# Patient Record
Sex: Female | Born: 1935
Health system: Southern US, Community
[De-identification: ages and names within clinical notes are randomized; demographics above are authoritative.]

## PROBLEM LIST (undated history)

## (undated) DIAGNOSIS — E119 Type 2 diabetes mellitus without complications: Secondary | ICD-10-CM

## (undated) DIAGNOSIS — J45909 Unspecified asthma, uncomplicated: Secondary | ICD-10-CM

## (undated) DIAGNOSIS — K219 Gastro-esophageal reflux disease without esophagitis: Secondary | ICD-10-CM

## (undated) DIAGNOSIS — I5030 Unspecified diastolic (congestive) heart failure: Secondary | ICD-10-CM

## (undated) DIAGNOSIS — I10 Essential (primary) hypertension: Secondary | ICD-10-CM

## (undated) DIAGNOSIS — E7800 Pure hypercholesterolemia, unspecified: Secondary | ICD-10-CM

## (undated) HISTORY — DX: Pure hypercholesterolemia, unspecified: E78.00

## (undated) HISTORY — DX: Unspecified diastolic (congestive) heart failure: I50.30

## (undated) HISTORY — DX: Gastro-esophageal reflux disease without esophagitis: K21.9

## (undated) HISTORY — DX: Unspecified asthma, uncomplicated: J45.909

## (undated) HISTORY — DX: Type 2 diabetes mellitus without complications: E11.9

## (undated) HISTORY — DX: Essential (primary) hypertension: I10

---

## 2011-05-07 ENCOUNTER — Ambulatory Visit: Payer: Self-pay | Admitting: Internal Medicine

## 2011-12-19 ENCOUNTER — Ambulatory Visit: Payer: Self-pay | Admitting: Internal Medicine

## 2012-11-30 ENCOUNTER — Emergency Department: Payer: Self-pay | Admitting: Emergency Medicine

## 2013-07-31 ENCOUNTER — Ambulatory Visit: Payer: Self-pay | Admitting: Internal Medicine

## 2013-08-06 ENCOUNTER — Ambulatory Visit: Payer: Self-pay | Admitting: Internal Medicine

## 2014-07-26 DIAGNOSIS — G5790 Unspecified mononeuropathy of unspecified lower limb: Secondary | ICD-10-CM | POA: Diagnosis not present

## 2014-07-26 DIAGNOSIS — E8881 Metabolic syndrome: Secondary | ICD-10-CM | POA: Diagnosis not present

## 2014-07-26 DIAGNOSIS — M7040 Prepatellar bursitis, unspecified knee: Secondary | ICD-10-CM | POA: Diagnosis not present

## 2014-07-26 DIAGNOSIS — E119 Type 2 diabetes mellitus without complications: Secondary | ICD-10-CM | POA: Diagnosis not present

## 2014-10-26 DIAGNOSIS — G5790 Unspecified mononeuropathy of unspecified lower limb: Secondary | ICD-10-CM | POA: Diagnosis not present

## 2014-10-26 DIAGNOSIS — M25561 Pain in right knee: Secondary | ICD-10-CM | POA: Diagnosis not present

## 2014-10-26 DIAGNOSIS — E119 Type 2 diabetes mellitus without complications: Secondary | ICD-10-CM | POA: Diagnosis not present

## 2014-10-27 ENCOUNTER — Other Ambulatory Visit: Payer: Self-pay | Admitting: Cardiology

## 2014-10-27 DIAGNOSIS — Z1231 Encounter for screening mammogram for malignant neoplasm of breast: Secondary | ICD-10-CM

## 2014-11-04 ENCOUNTER — Other Ambulatory Visit: Payer: Self-pay | Admitting: Cardiology

## 2014-11-04 ENCOUNTER — Ambulatory Visit
Admission: RE | Admit: 2014-11-04 | Discharge: 2014-11-04 | Disposition: A | Discharge status: Home / Self Care | Payer: Commercial Managed Care - HMO | Source: Ambulatory Visit | Attending: Cardiology | Admitting: Cardiology

## 2014-11-04 DIAGNOSIS — Z1231 Encounter for screening mammogram for malignant neoplasm of breast: Secondary | ICD-10-CM

## 2014-11-25 DIAGNOSIS — E119 Type 2 diabetes mellitus without complications: Secondary | ICD-10-CM | POA: Diagnosis not present

## 2014-11-25 DIAGNOSIS — G5790 Unspecified mononeuropathy of unspecified lower limb: Secondary | ICD-10-CM | POA: Diagnosis not present

## 2014-11-25 DIAGNOSIS — I10 Essential (primary) hypertension: Secondary | ICD-10-CM | POA: Diagnosis not present

## 2014-11-25 DIAGNOSIS — E784 Other hyperlipidemia: Secondary | ICD-10-CM | POA: Diagnosis not present

## 2014-12-02 DIAGNOSIS — E8881 Metabolic syndrome: Secondary | ICD-10-CM | POA: Diagnosis not present

## 2014-12-02 DIAGNOSIS — E119 Type 2 diabetes mellitus without complications: Secondary | ICD-10-CM | POA: Diagnosis not present

## 2014-12-02 DIAGNOSIS — G5790 Unspecified mononeuropathy of unspecified lower limb: Secondary | ICD-10-CM | POA: Diagnosis not present

## 2014-12-24 DIAGNOSIS — E119 Type 2 diabetes mellitus without complications: Secondary | ICD-10-CM | POA: Diagnosis not present

## 2015-03-04 DIAGNOSIS — I10 Essential (primary) hypertension: Secondary | ICD-10-CM | POA: Diagnosis not present

## 2015-03-04 DIAGNOSIS — Z23 Encounter for immunization: Secondary | ICD-10-CM | POA: Diagnosis not present

## 2015-03-04 DIAGNOSIS — G5790 Unspecified mononeuropathy of unspecified lower limb: Secondary | ICD-10-CM | POA: Diagnosis not present

## 2015-03-04 DIAGNOSIS — E119 Type 2 diabetes mellitus without complications: Secondary | ICD-10-CM | POA: Diagnosis not present

## 2015-04-01 DIAGNOSIS — E119 Type 2 diabetes mellitus without complications: Secondary | ICD-10-CM | POA: Diagnosis not present

## 2015-04-01 DIAGNOSIS — R1031 Right lower quadrant pain: Secondary | ICD-10-CM | POA: Diagnosis not present

## 2015-04-01 DIAGNOSIS — I10 Essential (primary) hypertension: Secondary | ICD-10-CM | POA: Diagnosis not present

## 2015-04-01 DIAGNOSIS — K5732 Diverticulitis of large intestine without perforation or abscess without bleeding: Secondary | ICD-10-CM | POA: Diagnosis not present

## 2015-04-07 ENCOUNTER — Ambulatory Visit
Admission: RE | Admit: 2015-04-07 | Discharge: 2015-04-07 | Disposition: A | Discharge status: Home / Self Care | Payer: Commercial Managed Care - HMO | Source: Ambulatory Visit | Attending: Internal Medicine | Admitting: Internal Medicine

## 2015-04-07 ENCOUNTER — Other Ambulatory Visit: Payer: Self-pay | Admitting: Internal Medicine

## 2015-04-07 ENCOUNTER — Ambulatory Visit
Admission: RE | Admit: 2015-04-07 | Discharge: 2015-04-07 | Disposition: A | Discharge status: Home / Self Care | Payer: Commercial Managed Care - HMO | Source: Ambulatory Visit | Attending: Cardiology | Admitting: Cardiology

## 2015-04-07 DIAGNOSIS — M5430 Sciatica, unspecified side: Secondary | ICD-10-CM

## 2015-04-07 DIAGNOSIS — M25551 Pain in right hip: Secondary | ICD-10-CM | POA: Diagnosis not present

## 2015-04-07 DIAGNOSIS — M5134 Other intervertebral disc degeneration, thoracic region: Secondary | ICD-10-CM | POA: Diagnosis not present

## 2015-04-07 DIAGNOSIS — E119 Type 2 diabetes mellitus without complications: Secondary | ICD-10-CM | POA: Diagnosis not present

## 2015-04-07 DIAGNOSIS — M5136 Other intervertebral disc degeneration, lumbar region: Secondary | ICD-10-CM | POA: Insufficient documentation

## 2015-04-07 DIAGNOSIS — M8580 Other specified disorders of bone density and structure, unspecified site: Secondary | ICD-10-CM | POA: Diagnosis not present

## 2015-04-12 DIAGNOSIS — K5732 Diverticulitis of large intestine without perforation or abscess without bleeding: Secondary | ICD-10-CM | POA: Diagnosis not present

## 2015-04-12 DIAGNOSIS — E8881 Metabolic syndrome: Secondary | ICD-10-CM | POA: Diagnosis not present

## 2015-04-12 DIAGNOSIS — E119 Type 2 diabetes mellitus without complications: Secondary | ICD-10-CM | POA: Diagnosis not present

## 2015-04-12 DIAGNOSIS — R1031 Right lower quadrant pain: Secondary | ICD-10-CM | POA: Diagnosis not present

## 2015-06-10 DIAGNOSIS — E119 Type 2 diabetes mellitus without complications: Secondary | ICD-10-CM | POA: Diagnosis not present

## 2015-06-10 DIAGNOSIS — E784 Other hyperlipidemia: Secondary | ICD-10-CM | POA: Diagnosis not present

## 2015-06-10 DIAGNOSIS — I10 Essential (primary) hypertension: Secondary | ICD-10-CM | POA: Diagnosis not present

## 2015-06-10 DIAGNOSIS — G5790 Unspecified mononeuropathy of unspecified lower limb: Secondary | ICD-10-CM | POA: Diagnosis not present

## 2015-06-10 DIAGNOSIS — R5381 Other malaise: Secondary | ICD-10-CM | POA: Diagnosis not present

## 2015-06-10 DIAGNOSIS — E8881 Metabolic syndrome: Secondary | ICD-10-CM | POA: Diagnosis not present

## 2015-06-17 DIAGNOSIS — Z0000 Encounter for general adult medical examination without abnormal findings: Secondary | ICD-10-CM | POA: Diagnosis not present

## 2015-06-17 DIAGNOSIS — G5790 Unspecified mononeuropathy of unspecified lower limb: Secondary | ICD-10-CM | POA: Diagnosis not present

## 2015-06-17 DIAGNOSIS — E8881 Metabolic syndrome: Secondary | ICD-10-CM | POA: Diagnosis not present

## 2015-06-17 DIAGNOSIS — E784 Other hyperlipidemia: Secondary | ICD-10-CM | POA: Diagnosis not present

## 2015-08-26 DIAGNOSIS — R300 Dysuria: Secondary | ICD-10-CM | POA: Diagnosis not present

## 2015-08-26 DIAGNOSIS — E119 Type 2 diabetes mellitus without complications: Secondary | ICD-10-CM | POA: Diagnosis not present

## 2015-09-16 DIAGNOSIS — I10 Essential (primary) hypertension: Secondary | ICD-10-CM | POA: Diagnosis not present

## 2015-09-16 DIAGNOSIS — E119 Type 2 diabetes mellitus without complications: Secondary | ICD-10-CM | POA: Diagnosis not present

## 2015-09-16 DIAGNOSIS — E784 Other hyperlipidemia: Secondary | ICD-10-CM | POA: Diagnosis not present

## 2015-09-16 DIAGNOSIS — G5790 Unspecified mononeuropathy of unspecified lower limb: Secondary | ICD-10-CM | POA: Diagnosis not present

## 2015-10-03 DIAGNOSIS — M7040 Prepatellar bursitis, unspecified knee: Secondary | ICD-10-CM | POA: Diagnosis not present

## 2015-10-03 DIAGNOSIS — G5790 Unspecified mononeuropathy of unspecified lower limb: Secondary | ICD-10-CM | POA: Diagnosis not present

## 2015-10-03 DIAGNOSIS — E784 Other hyperlipidemia: Secondary | ICD-10-CM | POA: Diagnosis not present

## 2015-11-09 DIAGNOSIS — I429 Cardiomyopathy, unspecified: Secondary | ICD-10-CM | POA: Diagnosis not present

## 2015-11-09 DIAGNOSIS — E784 Other hyperlipidemia: Secondary | ICD-10-CM | POA: Diagnosis not present

## 2015-11-09 DIAGNOSIS — R5381 Other malaise: Secondary | ICD-10-CM | POA: Diagnosis not present

## 2015-11-09 DIAGNOSIS — G5790 Unspecified mononeuropathy of unspecified lower limb: Secondary | ICD-10-CM | POA: Diagnosis not present

## 2015-11-09 DIAGNOSIS — R110 Nausea: Secondary | ICD-10-CM | POA: Diagnosis not present

## 2015-11-10 ENCOUNTER — Ambulatory Visit
Admission: RE | Admit: 2015-11-10 | Discharge: 2015-11-10 | Disposition: A | Discharge status: Home / Self Care | Payer: Commercial Managed Care - HMO | Source: Ambulatory Visit | Attending: Internal Medicine | Admitting: Internal Medicine

## 2015-11-10 ENCOUNTER — Other Ambulatory Visit: Payer: Self-pay | Admitting: Internal Medicine

## 2015-11-10 DIAGNOSIS — R0602 Shortness of breath: Secondary | ICD-10-CM | POA: Insufficient documentation

## 2015-11-10 DIAGNOSIS — R109 Unspecified abdominal pain: Secondary | ICD-10-CM | POA: Diagnosis not present

## 2015-11-10 DIAGNOSIS — R110 Nausea: Secondary | ICD-10-CM | POA: Insufficient documentation

## 2015-11-10 DIAGNOSIS — K760 Fatty (change of) liver, not elsewhere classified: Secondary | ICD-10-CM | POA: Diagnosis not present

## 2015-11-10 DIAGNOSIS — R1013 Epigastric pain: Secondary | ICD-10-CM

## 2015-11-10 DIAGNOSIS — J9811 Atelectasis: Secondary | ICD-10-CM | POA: Insufficient documentation

## 2015-11-10 DIAGNOSIS — R1310 Dysphagia, unspecified: Secondary | ICD-10-CM | POA: Insufficient documentation

## 2015-11-10 DIAGNOSIS — R05 Cough: Secondary | ICD-10-CM | POA: Diagnosis not present

## 2015-11-22 DIAGNOSIS — E8881 Metabolic syndrome: Secondary | ICD-10-CM | POA: Diagnosis not present

## 2015-11-22 DIAGNOSIS — K5753 Diverticulitis of both small and large intestine without perforation or abscess with bleeding: Secondary | ICD-10-CM | POA: Diagnosis not present

## 2015-11-22 DIAGNOSIS — E119 Type 2 diabetes mellitus without complications: Secondary | ICD-10-CM | POA: Diagnosis not present

## 2015-11-22 DIAGNOSIS — E784 Other hyperlipidemia: Secondary | ICD-10-CM | POA: Diagnosis not present

## 2015-12-07 DIAGNOSIS — N898 Other specified noninflammatory disorders of vagina: Secondary | ICD-10-CM | POA: Diagnosis not present

## 2015-12-07 DIAGNOSIS — E119 Type 2 diabetes mellitus without complications: Secondary | ICD-10-CM | POA: Diagnosis not present

## 2015-12-07 DIAGNOSIS — E8881 Metabolic syndrome: Secondary | ICD-10-CM | POA: Diagnosis not present

## 2015-12-07 DIAGNOSIS — E784 Other hyperlipidemia: Secondary | ICD-10-CM | POA: Diagnosis not present

## 2015-12-14 ENCOUNTER — Encounter: Payer: Self-pay | Admitting: Obstetrics and Gynecology

## 2015-12-14 ENCOUNTER — Ambulatory Visit (INDEPENDENT_AMBULATORY_CARE_PROVIDER_SITE_OTHER): Payer: Commercial Managed Care - HMO | Admitting: Obstetrics and Gynecology

## 2015-12-14 VITALS — BP 133/79 | HR 86 | Ht 63.0 in | Wt 237.2 lb

## 2015-12-14 DIAGNOSIS — N9489 Other specified conditions associated with female genital organs and menstrual cycle: Secondary | ICD-10-CM

## 2015-12-14 DIAGNOSIS — N898 Other specified noninflammatory disorders of vagina: Secondary | ICD-10-CM | POA: Diagnosis not present

## 2015-12-14 DIAGNOSIS — J45909 Unspecified asthma, uncomplicated: Secondary | ICD-10-CM | POA: Insufficient documentation

## 2015-12-14 DIAGNOSIS — I10 Essential (primary) hypertension: Secondary | ICD-10-CM | POA: Insufficient documentation

## 2015-12-14 DIAGNOSIS — L292 Pruritus vulvae: Secondary | ICD-10-CM | POA: Diagnosis not present

## 2015-12-14 DIAGNOSIS — E7800 Pure hypercholesterolemia, unspecified: Secondary | ICD-10-CM | POA: Insufficient documentation

## 2015-12-14 MED ORDER — FLUCONAZOLE 150 MG PO TABS: 150.0000 mg | ORAL_TABLET | Freq: Once | ORAL | Status: DC

## 2015-12-14 MED ORDER — CLOTRIMAZOLE-BETAMETHASONE 1-0.05 % EX CREA: 1.0000 "application " | TOPICAL_CREAM | Freq: Two times a day (BID) | CUTANEOUS | Status: DC

## 2015-12-14 NOTE — Progress Notes (Signed)
GYN ENCOUNTER NOTE  Subjective:       Vanessa Vazquez is a 81 y.o.Marland Kitchen female [id number] [phone], menopausal, on no HRT therapy, presents here for gynecologic evaluation of the following issues:  1. Vaginitis.    Patient reports several month history of vaginal labor and itching over her labia without any significant discharge. She recently took antibiotics this past week for "abdominal pain".  No known past history of yeast infections.  Significant comorbidities include obesity, type 2 diabetes mellitus, and hypertension. The patient reports that her blood sugars have been stable running in the 150 range.  Gynecologic History No LMP recorded. Patient is postmenopausal. Contraception: post menopausal status Last Pap: ? Last mammogram: 07/31/2013 BI-RADS 1  Obstetric History OB History  No data available    Past Medical History  Diagnosis Date  . GERD (gastroesophageal reflux disease)   . Hypertension   . Diabetes mellitus without complication ([place])   . Hypercholesteremia     No past surgical history on file.  No current outpatient prescriptions on file prior to visit.   No current facility-administered medications on file prior to visit.    No Known Allergies  Social History   Social History  . Marital Status: Widowed    Spouse Name: N/A  . Number of Children: N/A  . Years of Education: N/A   Occupational History  . Not on file.   Social History Main Topics  . Smoking status: Not on file  . Smokeless tobacco: Not on file  . Alcohol Use: Not on file  . Drug Use: Not on file  . Sexual Activity: Not on file   Other Topics Concern  . Not on file   Social History Narrative    Family History  Problem Relation Age of Onset  . Lung cancer Father   . Leukemia Sister     The following portions of the patient's history were reviewed and updated as appropriate: allergies, current medications, past family history, past medical history, past social history, past surgical  history and problem list.  Review of Systems Review of Systems - Per history of present illness  Objective:   BP 133/79 mmHg  Pulse 86  Ht 5\' 3"  (1.6 m)  Wt 237 lb 3 oz (107.588 kg)  BMI 42.03 kg/m2 CONSTITUTIONAL: Well-developed, well-nourished female in no acute distress. Patient has difficulty getting in the dorsal lithotomy position HENT:  Normocephalic, atraumatic.  NECK:  SKIN: Skin is wnot examined done  No erythema. No pallor. [place]: Alert and oriented to person, place, and time. PSYCHIATRIC: Normal mood and affect. Normal behavior. Normal judgment and thought content. CARDIOVASCULAR:Not Examined RESPIRATORY: Not Examined BREASTS: Not Examined ABDOMEN: Soft, non distended; Non tender.  No Organomegaly. PELVIC:  External Genitalia: Normal; no rash, no ulcer, no hyperemia, no discharge  BUS: Normal  Vagina: Fair estrogen effect; minimal clear secretions  Cervix: Not visualized  Uterus: Not examined  Adnexa: Not examined  RV: Normal  external exam  Bladder: Nontender MUSCULOSKELETAL: Normal range of motion. No tenderness.  No cyanosis, clubbing, or edema.  PROCEDURE: Wet prep Normal saline-moderate white blood cells; no trichomonas; no clue cells KOH-negative whiff test; no yeast     Assessment:   1. Vaginal odor 2. Vulvar itching 3. Leukorrhea on wet prep   Plan:   1. Diflucan 150 mg weekly 2 2. Mycolog ointment twice a day for 2 weeks 3. Return if symptoms persist over the next 4 weeks  Vanessa Mars, MD  Note: This dictation  was prepared with Dragon dictation along with smaller phrase technology. Any transcriptional errors that result from this process are unintentional.

## 2015-12-14 NOTE — Patient Instructions (Signed)
1. Diflucan 150 mg orally; repeat it in 1 week. 2. Mycolog ointment should be applied to the vulva twice a day for 2 weeks 3. Return if symptoms persist over the next month

## 2016-01-25 ENCOUNTER — Other Ambulatory Visit: Payer: Self-pay | Admitting: Obstetrics and Gynecology

## 2016-03-07 DIAGNOSIS — I10 Essential (primary) hypertension: Secondary | ICD-10-CM | POA: Diagnosis not present

## 2016-03-07 DIAGNOSIS — E8881 Metabolic syndrome: Secondary | ICD-10-CM | POA: Diagnosis not present

## 2016-03-07 DIAGNOSIS — G5790 Unspecified mononeuropathy of unspecified lower limb: Secondary | ICD-10-CM | POA: Diagnosis not present

## 2016-03-07 DIAGNOSIS — E784 Other hyperlipidemia: Secondary | ICD-10-CM | POA: Diagnosis not present

## 2016-03-07 DIAGNOSIS — Z23 Encounter for immunization: Secondary | ICD-10-CM | POA: Diagnosis not present

## 2016-03-09 ENCOUNTER — Other Ambulatory Visit: Payer: Self-pay | Admitting: Obstetrics and Gynecology

## 2016-03-17 ENCOUNTER — Observation Stay
Admission: EM | Admit: 2016-03-17 | Discharge: 2016-03-20 | Disposition: A | Discharge status: Home / Self Care | Payer: Commercial Managed Care - HMO | Attending: Internal Medicine | Admitting: Internal Medicine

## 2016-03-17 ENCOUNTER — Encounter: Payer: Self-pay | Admitting: Emergency Medicine

## 2016-03-17 DIAGNOSIS — K640 First degree hemorrhoids: Secondary | ICD-10-CM | POA: Diagnosis not present

## 2016-03-17 DIAGNOSIS — Z79899 Other long term (current) drug therapy: Secondary | ICD-10-CM | POA: Diagnosis not present

## 2016-03-17 DIAGNOSIS — I10 Essential (primary) hypertension: Secondary | ICD-10-CM | POA: Diagnosis not present

## 2016-03-17 DIAGNOSIS — K625 Hemorrhage of anus and rectum: Secondary | ICD-10-CM | POA: Diagnosis not present

## 2016-03-17 DIAGNOSIS — E876 Hypokalemia: Secondary | ICD-10-CM | POA: Insufficient documentation

## 2016-03-17 DIAGNOSIS — Z538 Procedure and treatment not carried out for other reasons: Secondary | ICD-10-CM | POA: Diagnosis not present

## 2016-03-17 DIAGNOSIS — K648 Other hemorrhoids: Secondary | ICD-10-CM | POA: Diagnosis not present

## 2016-03-17 DIAGNOSIS — J45909 Unspecified asthma, uncomplicated: Secondary | ICD-10-CM | POA: Diagnosis not present

## 2016-03-17 DIAGNOSIS — K219 Gastro-esophageal reflux disease without esophagitis: Secondary | ICD-10-CM | POA: Diagnosis not present

## 2016-03-17 DIAGNOSIS — R197 Diarrhea, unspecified: Secondary | ICD-10-CM | POA: Diagnosis not present

## 2016-03-17 DIAGNOSIS — E119 Type 2 diabetes mellitus without complications: Secondary | ICD-10-CM | POA: Insufficient documentation

## 2016-03-17 DIAGNOSIS — K922 Gastrointestinal hemorrhage, unspecified: Secondary | ICD-10-CM | POA: Diagnosis not present

## 2016-03-17 DIAGNOSIS — Z7982 Long term (current) use of aspirin: Secondary | ICD-10-CM | POA: Diagnosis not present

## 2016-03-17 DIAGNOSIS — D123 Benign neoplasm of transverse colon: Secondary | ICD-10-CM | POA: Diagnosis not present

## 2016-03-17 DIAGNOSIS — K5520 Angiodysplasia of colon without hemorrhage: Secondary | ICD-10-CM | POA: Diagnosis not present

## 2016-03-17 DIAGNOSIS — Z7984 Long term (current) use of oral hypoglycemic drugs: Secondary | ICD-10-CM | POA: Insufficient documentation

## 2016-03-17 DIAGNOSIS — Z87891 Personal history of nicotine dependence: Secondary | ICD-10-CM | POA: Insufficient documentation

## 2016-03-17 DIAGNOSIS — D122 Benign neoplasm of ascending colon: Principal | ICD-10-CM | POA: Insufficient documentation

## 2016-03-17 DIAGNOSIS — E785 Hyperlipidemia, unspecified: Secondary | ICD-10-CM | POA: Diagnosis not present

## 2016-03-17 DIAGNOSIS — E7800 Pure hypercholesterolemia, unspecified: Secondary | ICD-10-CM | POA: Diagnosis not present

## 2016-03-17 LAB — HEMOGLOBIN
HEMOGLOBIN: 12.8 g/dL (ref 12.0–16.0)
HEMOGLOBIN: 11.7 g/dL — AB (ref 12.0–16.0)

## 2016-03-17 LAB — COMPREHENSIVE METABOLIC PANEL
ALBUMIN: 3.6 g/dL (ref 3.5–5.0)
ALK PHOS: 81 U/L (ref 38–126)
ALT: 20 U/L (ref 14–54)
ANION GAP: 8 (ref 5–15)
AST: 26 U/L (ref 15–41)
BUN: 24 mg/dL — ABNORMAL HIGH (ref 6–20)
CHLORIDE: 99 mmol/L — AB (ref 101–111)
CO2: 27 mmol/L (ref 22–32)
CREATININE: 0.93 mg/dL (ref 0.44–1.00)
Calcium: 9.7 mg/dL (ref 8.9–10.3)
GFR calc non Af Amer: 57 mL/min — ABNORMAL LOW (ref >60)
GLUCOSE: 221 mg/dL — AB (ref 65–99)
Potassium: 3.3 mmol/L — ABNORMAL LOW (ref 3.5–5.1)
SODIUM: 134 mmol/L — AB (ref 135–145)
Total Bilirubin: 0.7 mg/dL (ref 0.3–1.2)
Total Protein: 7.2 g/dL (ref 6.5–8.1)

## 2016-03-17 LAB — CBC
HCT: 37.2 % (ref 35.0–47.0)
HEMOGLOBIN: 12.3 g/dL (ref 12.0–16.0)
MCH: 31.1 pg (ref 26.0–34.0)
MCHC: 33 g/dL (ref 32.0–36.0)
MCV: 94.3 fL (ref 80.0–100.0)
Platelets: 242 10*3/uL (ref 150–440)
RBC: 3.95 MIL/uL (ref 3.80–5.20)
RDW: 13 % (ref 11.5–14.5)
WBC: 5.5 10*3/uL (ref 3.6–11.0)

## 2016-03-17 LAB — ABO/RH: ABO/RH(D): O NEG

## 2016-03-17 LAB — GLUCOSE, CAPILLARY
Glucose-Capillary: 63 mg/dL — ABNORMAL LOW (ref 65–99)
Glucose-Capillary: 103 mg/dL — ABNORMAL HIGH (ref 65–99)
Glucose-Capillary: 153 mg/dL — ABNORMAL HIGH (ref 65–99)

## 2016-03-17 MED ORDER — INSULIN ASPART 100 UNIT/ML ~~LOC~~ SOLN
0.0000 [IU] | Freq: Three times a day (TID) | SUBCUTANEOUS | Status: DC
  Administered 2016-03-18 (×2): 2 [IU] via SUBCUTANEOUS
  Administered 2016-03-19: 3 [IU] via SUBCUTANEOUS
  Administered 2016-03-19: 2 [IU] via SUBCUTANEOUS
  Administered 2016-03-20: 3 [IU] via SUBCUTANEOUS
  Administered 2016-03-20: 2 [IU] via SUBCUTANEOUS
  Filled 2016-03-17: qty 3
  Filled 2016-03-17: qty 2
  Filled 2016-03-17: qty 3
  Filled 2016-03-17 (×3): qty 2

## 2016-03-17 MED ORDER — HYDROCHLOROTHIAZIDE 25 MG PO TABS
25.0000 mg | ORAL_TABLET | Freq: Every day | ORAL | Status: DC
  Administered 2016-03-18 – 2016-03-19 (×2): 25 mg via ORAL
  Filled 2016-03-17 (×2): qty 1

## 2016-03-17 MED ORDER — INSULIN ASPART 100 UNIT/ML ~~LOC~~ SOLN
0.0000 [IU] | Freq: Every day | SUBCUTANEOUS | Status: DC
  Administered 2016-03-19: 3 [IU] via SUBCUTANEOUS
  Filled 2016-03-17: qty 3

## 2016-03-17 MED ORDER — POTASSIUM CHLORIDE CRYS ER 20 MEQ PO TBCR
20.0000 meq | EXTENDED_RELEASE_TABLET | Freq: Two times a day (BID) | ORAL | Status: AC
  Administered 2016-03-17 – 2016-03-18 (×4): 20 meq via ORAL
  Filled 2016-03-17 (×4): qty 1

## 2016-03-17 MED ORDER — FUROSEMIDE 20 MG PO TABS
20.0000 mg | ORAL_TABLET | Freq: Every day | ORAL | Status: DC
  Administered 2016-03-18 – 2016-03-19 (×2): 20 mg via ORAL
  Filled 2016-03-17 (×2): qty 1

## 2016-03-17 MED ORDER — CINNAMON 500 MG PO CAPS: 1000.0000 mg | ORAL_CAPSULE | Freq: Every day | ORAL | Status: DC

## 2016-03-17 MED ORDER — ONDANSETRON HCL 4 MG/2ML IJ SOLN: 4.0000 mg | Freq: Four times a day (QID) | INTRAMUSCULAR | Status: DC | PRN

## 2016-03-17 MED ORDER — ATENOLOL 25 MG PO TABS
25.0000 mg | ORAL_TABLET | Freq: Every day | ORAL | Status: DC
  Administered 2016-03-18 – 2016-03-19 (×2): 25 mg via ORAL
  Filled 2016-03-17 (×2): qty 1

## 2016-03-17 MED ORDER — ONDANSETRON HCL 4 MG PO TABS: 4.0000 mg | ORAL_TABLET | Freq: Four times a day (QID) | ORAL | Status: DC | PRN

## 2016-03-17 MED ORDER — LISINOPRIL 10 MG PO TABS
10.0000 mg | ORAL_TABLET | Freq: Every day | ORAL | Status: DC
  Administered 2016-03-18 – 2016-03-19 (×2): 10 mg via ORAL
  Filled 2016-03-17 (×2): qty 1

## 2016-03-17 MED ORDER — ATORVASTATIN CALCIUM 10 MG PO TABS
10.0000 mg | ORAL_TABLET | Freq: Every day | ORAL | Status: DC
  Administered 2016-03-18 – 2016-03-20 (×3): 10 mg via ORAL
  Filled 2016-03-17 (×3): qty 1

## 2016-03-17 MED ORDER — ADULT MULTIVITAMIN W/MINERALS CH
1.0000 | ORAL_TABLET | Freq: Every day | ORAL | Status: DC
  Administered 2016-03-18 – 2016-03-20 (×3): 1 via ORAL
  Filled 2016-03-17 (×4): qty 1

## 2016-03-17 MED ORDER — ACETAMINOPHEN 650 MG RE SUPP: 650.0000 mg | Freq: Four times a day (QID) | RECTAL | Status: DC | PRN

## 2016-03-17 MED ORDER — OMEGA-3-ACID ETHYL ESTERS 1 G PO CAPS
1.0000 g | ORAL_CAPSULE | Freq: Every day | ORAL | Status: DC
  Administered 2016-03-18 – 2016-03-20 (×3): 1 g via ORAL
  Filled 2016-03-17 (×3): qty 1

## 2016-03-17 MED ORDER — ACETAMINOPHEN 325 MG PO TABS: 650.0000 mg | ORAL_TABLET | Freq: Four times a day (QID) | ORAL | Status: DC | PRN

## 2016-03-17 NOTE — Consult Note (Signed)
GI Inpatient Consult Note  Reason for Consult:LGI bleeding   Attending Requesting Consult:Dr. Verdell Carmine  History of Present Illness: Vanessa Vazquez is a 81 y.o. female who had some rectal bleeding Wednesday once and one this morning with diarrhea.  She thinks there was more blood passed Wednesday than today.  She had a colonoscopy a few years ago at [facility] and does not remember any significant findings.  Currently she denies any abdominal pain, vomiting, fever, SOB or chest pain.  Past Medical History:  Past Medical History:  Diagnosis Date  . Asthma   . Diabetes mellitus without complication ([place])   . GERD (gastroesophageal reflux disease)   . Hypercholesteremia   . Hypertension     Problem List: Patient Active Problem List   Diagnosis Date Noted  . GI bleed 03/17/2016  . Vaginal odor 12/14/2015  . Vulvar itching 12/14/2015  . Vaginal leukorrhea 12/14/2015  . Hypertension   . Hypercholesteremia   . Asthma     Past Surgical History: Past Surgical History:  Procedure Laterality Date  . CESAREAN SECTION      Allergies: No Known Allergies  Home Medications: Prescriptions Prior to Admission  Medication Sig Dispense Refill Last Dose  . aspirin EC 81 MG tablet Take 81 mg by mouth daily.   03/17/2016 at 0830  . atenolol (TENORMIN) 25 MG tablet Take 25 mg by mouth daily.    03/17/2016 at 0830  . atorvastatin (LIPITOR) 10 MG tablet Take 10 mg by mouth daily.    03/17/2016 at am  . Cinnamon 500 MG capsule Take 1,000 mg by mouth daily.    03/17/2016 at am  . furosemide (LASIX) 20 MG tablet Take 20 mg by mouth daily.    03/17/2016 at am  . glimepiride (AMARYL) 2 MG tablet Take 2 mg by mouth 2 (two) times daily.    03/17/2016 at am  . hydrochlorothiazide (HYDRODIURIL) 25 MG tablet Take 25 mg by mouth daily.    03/17/2016 at am  . lisinopril (PRINIVIL,ZESTRIL) 10 MG tablet Take 10 mg by mouth daily.    03/17/2016 at am  . metFORMIN (GLUCOPHAGE) 1000 MG tablet Take 1,000 mg by  mouth 2 (two) times daily.    03/17/2016 at am  . Multiple Vitamin (MULTIVITAMIN) tablet Take 1 tablet by mouth daily.   03/17/2016 at am  . Omega-3 Fatty Acids (FISH OIL) 1000 MG CAPS Take 1,000 mg by mouth daily.    03/17/2016 at am  . fluconazole (DIFLUCAN) 150 MG tablet Take 1 tablet (150 mg total) by mouth once. Can take additional dose 7 days later if symptoms persist (Patient not taking: Reported on 03/17/2016) 1 tablet 3 Not Taking at Unknown time   Home medication reconciliation was completed with the patient.   Scheduled Inpatient Medications:   . [START ON 03/18/2016] atenolol  25 mg Oral Daily  . [START ON 03/18/2016] atorvastatin  10 mg Oral Daily  . [START ON 03/18/2016] furosemide  20 mg Oral Daily  . [START ON 03/18/2016] hydrochlorothiazide  25 mg Oral Daily  . insulin aspart  0-5 Units Subcutaneous QHS  . insulin aspart  0-9 Units Subcutaneous TID WC  . [START ON 03/18/2016] lisinopril  10 mg Oral Daily  . [START ON 03/18/2016] multivitamin with minerals  1 tablet Oral Daily  . [START ON 03/18/2016] omega-3 acid ethyl esters  1 g Oral Daily  . potassium chloride  20 mEq Oral BID    Continuous Inpatient Infusions:     PRN  Inpatient Medications:  acetaminophen **OR** acetaminophen, ondansetron **OR** ondansetron (ZOFRAN) IV  Family History: family history includes Asthma in her child; Diabetes in her sister; Leukemia in her sister; Lung cancer in her father.  The patient's family history is negative for inflammatory bowel disorders, GI malignancy, or solid organ transplantation.  Social History:   reports that she has quit smoking. She has never used smokeless tobacco. She reports that she does not drink alcohol or use drugs. The patient denies ETOH, tobacco, or drug use.   Review of Systems: Constitutional: Weight is stable.  Eyes: No changes in vision. ENT: No oral lesions, sore throat.  GI: see HPI.  Heme/Lymph: No easy bruising.  CV: No chest pain.  GU: No  hematuria.  Integumentary: No rashes.  Neuro: No headaches.  Psych: No depression/anxiety.  Endocrine: No heat/cold intolerance.  Allergic/Immunologic: No urticaria.  Resp: No cough, SOB.  Musculoskeletal: No joint swelling.    Physical Examination: BP (!) 134/58 (BP Location: Left Arm)   Pulse 62   Temp 98 F (36.7 C) (Oral)   Resp 16   Ht 5\' 4"  (1.626 m)   Wt 106.6 kg (235 lb)   SpO2 100%   BMI 40.34 kg/m  Gen: NAD, alert and oriented x 4 HEENT: PEERLA, EOMI,conjunctiva OK Neck: supple, no JVD or thyromegaly Chest: CTA bilaterally, no wheezes, crackles, or other adventitious sounds CV: RRR, no m/g/c/r Abd: soft, NT, ND, +BS in all four quadrants; no HSM, guarding, ridigity, or rebound tenderness Ext: no edema, well perfused with 2+ pulses, Skin: no rash or lesions noted Lymph: no LAD  Data: Lab Results  Component Value Date   WBC 5.5 03/17/2016   HGB 12.8 03/17/2016   HCT 37.2 03/17/2016   MCV 94.3 03/17/2016   PLT 242 03/17/2016    Recent Labs Lab 03/17/16 1125 03/17/16 1449  HGB 12.3 12.8   Lab Results  Component Value Date   NA 134 (L) 03/17/2016   K 3.3 (L) 03/17/2016   CL 99 (L) 03/17/2016   CO2 27 03/17/2016   BUN 24 (H) 03/17/2016   CREATININE 0.93 03/17/2016   Lab Results  Component Value Date   ALT 20 03/17/2016   AST 26 03/17/2016   ALKPHOS 81 03/17/2016   BILITOT 0.7 03/17/2016   No results for input(s): APTT, INR, PTT in the last 168 hours. Assessment/Plan: Ms. Yoh is a 81 y.o. female with rectal bleeding without pain,  She has stable vitals  And her hgb is good.  Will prep her early Monday morning and do a colonoscopy Monday afternoon for her bleeding.  Recommendations:  Colonoscopy Monday afternoon.  Thank you for the consult. Please call with questions or concerns.  Gaylyn Cheers, MD

## 2016-03-17 NOTE — H&P (Signed)
[facility] [place] NAME: Vanessa Vazquez    MR#:  [id number]  DATE OF BIRTH:  10/09/1934  DATE OF ADMISSION:  03/17/2016  PRIMARY CARE PHYSICIAN: Cletis Athens, MD   REQUESTING/REFERRING PHYSICIAN: Dr. Larae Grooms  CHIEF COMPLAINT:   Chief Complaint  Patient presents with  . Rectal Bleeding    HISTORY OF PRESENT ILLNESS:  Vanessa Vazquez  is a 81 y.o. female with a known history of Diabetes, hypertension, hyperlipidemia, GERD, asthma who presents to the hospital due to rectal bleeding. Patient has had 2 episodes of rectal bleeding prior to coming to the emergency room. Patient had one episode this morning which was consistent with just bright red blood. She had an episode 2 days ago which was red blood combined with some brown stool. Patient denies any abdominal pain, nausea vomiting, fevers chills chest pain shortness of breath or any other associated symptoms. Given her multiple episodes of rectal bleeding hospitalist services were contacted further treatment and evaluation.  PAST MEDICAL HISTORY:   Past Medical History:  Diagnosis Date  . Asthma   . Diabetes mellitus without complication ([place])   . GERD (gastroesophageal reflux disease)   . Hypercholesteremia   . Hypertension     PAST SURGICAL HISTORY:   Past Surgical History:  Procedure Laterality Date  . CESAREAN SECTION      SOCIAL HISTORY:   Social History  Substance Use Topics  . Smoking status: Former Research scientist (life sciences)  . Smokeless tobacco: Never Used  . Alcohol use No    FAMILY HISTORY:   Family History  Problem Relation Age of Onset  . Lung cancer Father   . Leukemia Sister   . Diabetes Sister   . Asthma Child     DRUG ALLERGIES:  No Known Allergies  REVIEW OF SYSTEMS:   Review of Systems  Constitutional: Negative for fever and weight loss.  HENT: Negative for congestion, nosebleeds and tinnitus.   Eyes: Negative for blurred vision, double vision and redness.   Respiratory: Negative for cough, hemoptysis and shortness of breath.   Cardiovascular: Negative for chest pain, orthopnea, leg swelling and PND.  Gastrointestinal: Positive for blood in stool. Negative for abdominal pain, diarrhea, melena, nausea and vomiting.  Genitourinary: Negative for dysuria, hematuria and urgency.  Musculoskeletal: Negative for falls and joint pain.  Neurological: Negative for dizziness, tingling, sensory change, focal weakness, seizures, weakness and headaches.  Endo/Heme/Allergies: Negative for polydipsia. Does not bruise/bleed easily.  Psychiatric/Behavioral: Negative for depression and memory loss. The patient is not nervous/anxious.     MEDICATIONS AT HOME:   Prior to Admission medications   Medication Sig Start Date End Date Taking? Authorizing Provider  aspirin EC 81 MG tablet Take 81 mg by mouth daily.   Yes Historical Provider, MD  atenolol (TENORMIN) 25 MG tablet Take 25 mg by mouth daily.    Yes Historical Provider, MD  atorvastatin (LIPITOR) 10 MG tablet Take 10 mg by mouth daily.    Yes Historical Provider, MD  Cinnamon 500 MG capsule Take 1,000 mg by mouth daily.    Yes Historical Provider, MD  furosemide (LASIX) 20 MG tablet Take 20 mg by mouth daily.    Yes Historical Provider, MD  glimepiride (AMARYL) 2 MG tablet Take 2 mg by mouth 2 (two) times daily.    Yes Historical Provider, MD  hydrochlorothiazide (HYDRODIURIL) 25 MG tablet Take 25 mg by mouth daily.    Yes Historical Provider, MD  lisinopril (PRINIVIL,ZESTRIL) 10 MG  tablet Take 10 mg by mouth daily.    Yes Historical Provider, MD  metFORMIN (GLUCOPHAGE) 1000 MG tablet Take 1,000 mg by mouth 2 (two) times daily.    Yes Historical Provider, MD  Multiple Vitamin (MULTIVITAMIN) tablet Take 1 tablet by mouth daily.   Yes Historical Provider, MD  Omega-3 Fatty Acids (FISH OIL) 1000 MG CAPS Take 1,000 mg by mouth daily.    Yes Historical Provider, MD  fluconazole (DIFLUCAN) 150 MG tablet Take 1  tablet (150 mg total) by mouth once. Can take additional dose 7 days later if symptoms persist Patient not taking: Reported on 03/17/2016 12/14/15   Alanda Slim Defrancesco, MD      VITAL SIGNS:  Blood pressure 130/75, pulse 91, temperature 98 F (36.7 C), temperature source Oral, resp. rate 16, height 5\' 4"  (1.626 m), weight 106.6 kg (235 lb), SpO2 97 %.  PHYSICAL EXAMINATION:  Physical Exam  GENERAL:  81 y.o.-year-old patient lying in the bed in no acute distress.  EYES: Pupils equal, round, reactive to light and accommodation. No scleral icterus. Extraocular muscles intact.  HEENT: Head atraumatic, normocephalic. Oropharynx and nasopharynx clear. No oropharyngeal erythema, moist oral mucosa  NECK:  Supple, no jugular venous distention. No thyroid enlargement, no tenderness.  LUNGS: Normal breath sounds bilaterally, no wheezing, rales, rhonchi. No use of accessory muscles of respiration.  CARDIOVASCULAR: S1, S2 RRR. No murmurs, rubs, gallops, clicks.  ABDOMEN: Soft, nontender, nondistended. Bowel sounds present. No organomegaly or mass.  EXTREMITIES: No pedal edema, cyanosis, or clubbing. + 2 pedal & radial pulses b/l.   NEUROLOGIC: Cranial nerves II through XII are intact. No focal Motor or sensory deficits appreciated b/l PSYCHIATRIC: The patient is alert and oriented x 3. Good affect.  SKIN: No obvious rash, lesion, or ulcer.   LABORATORY PANEL:   CBC  Recent Labs Lab 03/17/16 1125  WBC 5.5  HGB 12.3  HCT 37.2  PLT 242   ------------------------------------------------------------------------------------------------------------------  Chemistries   Recent Labs Lab 03/17/16 1125  NA 134*  K 3.3*  CL 99*  CO2 27  GLUCOSE 221*  BUN 24*  CREATININE 0.93  CALCIUM 9.7  AST 26  ALT 20  ALKPHOS 81  BILITOT 0.7   ------------------------------------------------------------------------------------------------------------------  Cardiac Enzymes No results for  input(s): TROPONINI in the last 168 hours. ------------------------------------------------------------------------------------------------------------------  RADIOLOGY:  No results found.   IMPRESSION AND PLAN:   81 year old female with past medical history of diabetes, hypertension, GERD, asthma who presents to the hospital due to rectal bleeding.  1. GI bleed-this is a lower GI bleed given the patient's rectal bleeding. -Hemoglobin stable. Suspected to be either hemorrhoidal or diverticular in nature. -We'll follow serial hemoglobins, hold aspirin. Get a gastroenterology consult. -clear liquid diet for now  2. Diabetes type 2 without complication- hold Metformin, omeprazole right. -Place on sliding scale insulin. -Follow blood sugars.  3. Hypokalemia-we'll place on oral potassium supplements. Repeat level in the morning. Check magnesium level.  4. Essential hypertension-continue atenolol, lisinopril, HCTZ.  5. Hyperlipidemia-continue atorvastatin.      All the records are reviewed and case discussed with ED provider. Management plans discussed with the patient, family and they are in agreement.  CODE STATUS: Full Code  TOTAL TIME TAKING CARE OF THIS PATIENT: 45 minutes.    Henreitta Leber M.D on 03/17/2016 at 1:47 PM  Between 7am to 6pm - Pager - [phone]  After 6pm go to [email or web address] - password [email or web address] [facility]  [facility]  Office  [phone]  CC: Primary  care physician; Cletis Athens, MD

## 2016-03-17 NOTE — ED Triage Notes (Signed)
Pt reports having a dark bowel movement on Wednesday with blood on the tissue when she wiped. Reports she has not had another bowel movement since then, but this morning she passed bright red blood into the toilet with no stool noted. Also complaining of intermittent abdominal pain. Denies N/V. Denies history of same.

## 2016-03-17 NOTE — ED Provider Notes (Signed)
[facility] Emergency Department Provider Note  ____________________________________________   First MD Initiated Contact with Patient 03/17/16 1119     (approximate)  I have reviewed the triage vital signs and the nursing notes.   HISTORY  Chief Complaint Rectal Bleeding   HPI Vanessa Vazquez is a 81 y.o. female with a history of diabetes, hypertension and GERD who is presenting to the emergency department with multiple episodes of bright red blood per rectum. She says that she had a dark stool this past Wednesday with blood on the toilet tissue. She says that she is also been having intermittent left lower quadrant abdominal pain especially when walking. Denies any pain at this time. She said then today she had a bowel movement with bright red blood. She said it was almost like she was urinating. She denies any clots. Denies any nausea or vomiting. Takes aspirin as her only anticoagulant.   Past Medical History:  Diagnosis Date  . Asthma   . Diabetes mellitus without complication ([place])   . GERD (gastroesophageal reflux disease)   . Hypercholesteremia   . Hypertension     Patient Active Problem List   Diagnosis Date Noted  . Vaginal odor 12/14/2015  . Vulvar itching 12/14/2015  . Vaginal leukorrhea 12/14/2015  . Hypertension   . Hypercholesteremia   . Asthma     Past Surgical History:  Procedure Laterality Date  . CESAREAN SECTION      Prior to Admission medications   Medication Sig Start Date End Date Taking? Authorizing Provider  aspirin EC 81 MG tablet Take 81 mg by mouth daily.   Yes Historical Provider, MD  atenolol (TENORMIN) 25 MG tablet Take 25 mg by mouth daily.    Yes Historical Provider, MD  atorvastatin (LIPITOR) 10 MG tablet Take 10 mg by mouth daily.    Yes Historical Provider, MD  Cinnamon 500 MG capsule Take 1,000 mg by mouth daily.    Yes Historical Provider, MD  furosemide (LASIX) 20 MG tablet Take 20 mg by mouth daily.     Yes Historical Provider, MD  glimepiride (AMARYL) 2 MG tablet Take 2 mg by mouth 2 (two) times daily.    Yes Historical Provider, MD  hydrochlorothiazide (HYDRODIURIL) 25 MG tablet Take 25 mg by mouth daily.    Yes Historical Provider, MD  lisinopril (PRINIVIL,ZESTRIL) 10 MG tablet Take 10 mg by mouth daily.    Yes Historical Provider, MD  metFORMIN (GLUCOPHAGE) 1000 MG tablet Take 1,000 mg by mouth 2 (two) times daily.    Yes Historical Provider, MD  Multiple Vitamin (MULTIVITAMIN) tablet Take 1 tablet by mouth daily.   Yes Historical Provider, MD  Omega-3 Fatty Acids (FISH OIL) 1000 MG CAPS Take 1,000 mg by mouth daily.    Yes Historical Provider, MD  fluconazole (DIFLUCAN) 150 MG tablet Take 1 tablet (150 mg total) by mouth once. Can take additional dose 7 days later if symptoms persist Patient not taking: Reported on 03/17/2016 12/14/15   Alanda Slim Defrancesco, MD    Allergies Review of patient's allergies indicates no known allergies.  Family History  Problem Relation Age of Onset  . Lung cancer Father   . Leukemia Sister   . Diabetes Sister   . Asthma Child     Social History Social History  Substance Use Topics  . Smoking status: Former Research scientist (life sciences)  . Smokeless tobacco: Never Used  . Alcohol use No    Review of Systems Constitutional: No fever/chills Eyes: No  visual changes. ENT: No sore throat. Cardiovascular: Denies chest pain. Respiratory: Denies shortness of breath. Gastrointestinal: No nausea, no vomiting.  No diarrhea.  No constipation. Genitourinary: Negative for dysuria. Musculoskeletal: Negative for back pain. Skin: Negative for rash. Neurological: Negative for headaches, focal weakness or numbness.  10-point ROS otherwise negative.  ____________________________________________   PHYSICAL EXAM:  VITAL SIGNS: ED Triage Vitals [03/17/16 1048]  Enc Vitals Group     BP (!) 160/74     Pulse Rate 74     Resp 18     Temp 98 F (36.7 C)     Temp Source Oral       SpO2 95 %     Weight 235 lb (106.6 kg)     Height 5\' 4"  (1.626 m)     Head Circumference      Peak Flow      Pain Score      Pain Loc      Pain Edu?      Excl. in [place]?     Constitutional: Alert and oriented. Well appearing and in no acute distress. Eyes: Conjunctivae are normal. PERRL. EOMI. Head: Atraumatic. Nose: No congestion/rhinnorhea. Mouth/Throat: Mucous membranes are moist.   Neck: No stridor.   Cardiovascular: Normal rate, regular rhythm. Grossly normal heart sounds.   Respiratory: Normal respiratory effort.  No retractions. Lungs CTAB. Gastrointestinal: Soft with very mild left lower quadrant tenderness to palpation. No rebound or guarding. No distention. No CVA tenderness. Maroon, heme-positive stool. Minimal amount of stool in the rectum on exam. Several, non-engorged, external hemorrhoids. Musculoskeletal: No lower extremity tenderness nor edema.  No joint effusions. Neurologic:  Normal speech and language. No gross focal neurologic deficits are appreciated. No gait instability. Skin:  Skin is warm, dry and intact. No rash noted. Psychiatric: Mood and affect are normal. Speech and behavior are normal.  ____________________________________________   LABS (all labs ordered are listed, but only abnormal results are displayed)  Labs Reviewed  COMPREHENSIVE METABOLIC PANEL - Abnormal; Notable for the following:       Result Value   Sodium 134 (*)    Potassium 3.3 (*)    Chloride 99 (*)    Glucose, Bld 221 (*)    BUN 24 (*)    GFR calc non Af Amer 57 (*)    All other components within normal limits  CBC  TYPE AND SCREEN   ____________________________________________  EKG   ____________________________________________  RADIOLOGY   ____________________________________________   PROCEDURES  Procedure(s) performed:   Procedures  Critical Care performed:   ____________________________________________   INITIAL IMPRESSION / ASSESSMENT AND PLAN /  ED COURSE  Pertinent labs & imaging results that were available during my care of the patient were reviewed by me and considered in my medical decision making (see chart for details).  ----------------------------------------- 1:40 PM on 03/17/2016 -----------------------------------------  Patient without any further episodes of bright red blood per rectum in the emergency department. Will be admitted to the hospital for further observation. He spends the patient and she is understanding of this plan and willing to comply. Signed out to Dr. Verdell Carmine.  Clinical Course     ____________________________________________   FINAL CLINICAL IMPRESSION(S) / ED DIAGNOSES  Acute GI bleeding.    NEW MEDICATIONS STARTED DURING THIS VISIT:  New Prescriptions   No medications on file     Note:  This document was prepared using Dragon voice recognition software and may include unintentional dictation errors.    Orbie Pyo, MD 03/17/16  1340  

## 2016-03-17 NOTE — Care Management Obs Status (Signed)
[place] NOTIFICATION   Patient Details  Name: Vanessa Vazquez MRN: [id number] Date of Birth: 05-31-1934   Medicare Observation Status Notification Given:  Yes (GI bleed)    Ival Bible, RN 03/17/2016, 2:08 PM

## 2016-03-17 NOTE — Progress Notes (Signed)
PHARMACIST - PHYSICIAN ORDER COMMUNICATION  CONCERNING: P&T Medication Policy on Herbal Medications  DESCRIPTION:  This patient's order for:  Cinnamon  has been noted.  This product is classified as an "herbal" or natural product. Due to a lack of definitive safety studies or FDA approval, nonstandard manufacturing practices, plus the potential risk of unknown drug-drug interactions while on inpatient medications, the Pharmacy and Therapeutics Committee does not permit the use of "herbal" or natural products of this type within [facility].   ACTION TAKEN: The pharmacy department is unable to verify this order at this time. Please reevaluate patient's clinical condition at discharge and address if the herbal or natural product should be resumed at that time.   Loree Fee, PharmD 2:58 PM 03/17/2016

## 2016-03-18 DIAGNOSIS — E785 Hyperlipidemia, unspecified: Secondary | ICD-10-CM | POA: Diagnosis not present

## 2016-03-18 DIAGNOSIS — E119 Type 2 diabetes mellitus without complications: Secondary | ICD-10-CM | POA: Diagnosis not present

## 2016-03-18 DIAGNOSIS — I10 Essential (primary) hypertension: Secondary | ICD-10-CM | POA: Diagnosis not present

## 2016-03-18 DIAGNOSIS — D122 Benign neoplasm of ascending colon: Secondary | ICD-10-CM | POA: Diagnosis not present

## 2016-03-18 DIAGNOSIS — K625 Hemorrhage of anus and rectum: Secondary | ICD-10-CM | POA: Diagnosis not present

## 2016-03-18 LAB — GLUCOSE, CAPILLARY
GLUCOSE-CAPILLARY: 162 mg/dL — AB (ref 65–99)
GLUCOSE-CAPILLARY: 200 mg/dL — AB (ref 65–99)
Glucose-Capillary: 116 mg/dL — ABNORMAL HIGH (ref 65–99)
Glucose-Capillary: 166 mg/dL — ABNORMAL HIGH (ref 65–99)

## 2016-03-18 LAB — MAGNESIUM: Magnesium: 1.6 mg/dL — ABNORMAL LOW (ref 1.7–2.4)

## 2016-03-18 LAB — HEMOGLOBIN
HEMOGLOBIN: 11.6 g/dL — AB (ref 12.0–16.0)
HEMOGLOBIN: 12 g/dL (ref 12.0–16.0)

## 2016-03-18 MED ORDER — PEG 3350-KCL-NA BICARB-NACL 420 G PO SOLR
4000.0000 mL | Freq: Once | ORAL | Status: AC
  Administered 2016-03-19: 4000 mL via ORAL
  Filled 2016-03-18: qty 4000

## 2016-03-18 NOTE — Consult Note (Signed)
Patient denies any further bleeding, hgb 11.6 mg 1.6 BP 114/48 P 66 R 18.  Will keep on clear liquid diet andstart prep at 5am Monday morning and do colonoscopy Monday afternoon.

## 2016-03-18 NOTE — Progress Notes (Signed)
[place] at [place] NAME: Vanessa Vazquez    MR#:  [id number]  DATE OF BIRTH:  Mar 17, 1935  SUBJECTIVE: Admitted for rectal bleeding. Patient has no further rectal bleeding, hemoglobin stable at 12. Scheduled for colonoscopy tomorrow. Patient denies any other complaints.   CHIEF COMPLAINT:   Chief Complaint  Patient presents with  . Rectal Bleeding    REVIEW OF SYSTEMS:   ROS CONSTITUTIONAL: No fever, fatigue or weakness.  EYES: No blurred or double vision.  EARS, NOSE, AND THROAT: No tinnitus or ear pain.  RESPIRATORY: No cough, shortness of breath, wheezing or hemoptysis.  CARDIOVASCULAR: No chest pain, orthopnea, edema.  GASTROINTESTINAL: No nausea, vomiting, diarrhea or abdominal pain.  GENITOURINARY: No dysuria, hematuria.  ENDOCRINE: No polyuria, nocturia,  HEMATOLOGY: No anemia, easy bruising or bleeding SKIN: No rash or lesion. MUSCULOSKELETAL: No joint pain or arthritis.   NEUROLOGIC: No tingling, numbness, weakness.  PSYCHIATRY: No anxiety or depression.   DRUG ALLERGIES:  No Known Allergies  VITALS:  Blood pressure 112/60, pulse 72, temperature 98.1 F (36.7 C), temperature source Oral, resp. rate 18, height 5\' 4"  (1.626 m), weight 106.6 kg (235 lb), SpO2 95 %.  PHYSICAL EXAMINATION:  GENERAL:  81 y.o.-year-old patient lying in the bed with no acute distress.  EYES: Pupils equal, round, reactive to light and accommodation. No scleral icterus. Extraocular muscles intact.  HEENT: Head atraumatic, normocephalic. Oropharynx and nasopharynx clear.  NECK:  Supple, no jugular venous distention. No thyroid enlargement, no tenderness.  LUNGS: Normal breath sounds bilaterally, no wheezing, rales,rhonchi or crepitation. No use of accessory muscles of respiration.  CARDIOVASCULAR: S1, S2 normal. No murmurs, rubs, or gallops.  ABDOMEN: Soft, nontender, nondistended. Bowel sounds present. No organomegaly or mass.  EXTREMITIES:  No pedal edema, cyanosis, or clubbing.  NEUROLOGIC: Cranial nerves II through XII are intact. Muscle strength 5/5 in all extremities. Sensation intact. Gait not checked.  PSYCHIATRIC: The patient is alert and oriented x 3.  SKIN: No obvious rash, lesion, or ulcer.    LABORATORY PANEL:   CBC  Recent Labs Lab 03/17/16 1125  03/18/16 0847  WBC 5.5  --   --   HGB 12.3  < > 12.0  HCT 37.2  --   --   PLT 242  --   --   < > = values in this interval not displayed. ------------------------------------------------------------------------------------------------------------------  Chemistries   Recent Labs Lab 03/17/16 1125 03/18/16 0320  NA 134*  --   K 3.3*  --   CL 99*  --   CO2 27  --   GLUCOSE 221*  --   BUN 24*  --   CREATININE 0.93  --   CALCIUM 9.7  --   MG  --  1.6*  AST 26  --   ALT 20  --   ALKPHOS 81  --   BILITOT 0.7  --    ------------------------------------------------------------------------------------------------------------------  Cardiac Enzymes No results for input(s): TROPONINI in the last 168 hours. ------------------------------------------------------------------------------------------------------------------  RADIOLOGY:  No results found.  EKG:  No orders found for this or any previous visit.  ASSESSMENT AND PLAN:   #1 rectal bleeding, hemodynamically stable, 11 stable at 12. Seen by gastroenterology, scheduled for colonoscopy tomorrow, continue clear liquid. Hold aspirin #2 diabetes mellitus type 2: Hold diabetic medication, continue SSI with coverage, #3 Hypertension: Controlled, continue Lasix, hydrochlorothiazide, atenolol. 4.History of  Asthma; no wheezing.   All the records are reviewed and case discussed with Care  Management/Social Workerr. Management plans discussed with the patient, family and they are in agreement.  CODE STATUS:full  TOTAL TIME TAKING CARE OF THIS PATIENT:  35 minutes.   POSSIBLE D/C IN  1-2 DAYS,  DEPENDING ON CLINICAL CONDITION.   Epifanio Lesches M.D on 03/18/2016 at 10:30 AM  Between 7am to 6pm - Pager - [phone]  After 6pm go to [email or web address] - password [email or web address] [facility]  Office  [phone]  CC: Primary care physician; Cletis Athens, MD   Note: This dictation was prepared with Dragon dictation along with smaller phrase technology. Any transcriptional errors that result from this process are unintentional.

## 2016-03-19 ENCOUNTER — Observation Stay: Payer: Commercial Managed Care - HMO | Admitting: Anesthesiology

## 2016-03-19 ENCOUNTER — Encounter
Admission: EM | Disposition: A | Discharge status: Home / Self Care | Payer: Self-pay | Source: Home / Self Care | Attending: Emergency Medicine

## 2016-03-19 ENCOUNTER — Encounter: Payer: Self-pay | Admitting: *Deleted

## 2016-03-19 DIAGNOSIS — Z538 Procedure and treatment not carried out for other reasons: Secondary | ICD-10-CM | POA: Diagnosis not present

## 2016-03-19 DIAGNOSIS — E119 Type 2 diabetes mellitus without complications: Secondary | ICD-10-CM | POA: Diagnosis not present

## 2016-03-19 DIAGNOSIS — D122 Benign neoplasm of ascending colon: Secondary | ICD-10-CM | POA: Diagnosis not present

## 2016-03-19 DIAGNOSIS — I10 Essential (primary) hypertension: Secondary | ICD-10-CM | POA: Diagnosis not present

## 2016-03-19 DIAGNOSIS — K635 Polyp of colon: Secondary | ICD-10-CM | POA: Diagnosis not present

## 2016-03-19 DIAGNOSIS — K5520 Angiodysplasia of colon without hemorrhage: Secondary | ICD-10-CM | POA: Diagnosis not present

## 2016-03-19 DIAGNOSIS — K649 Unspecified hemorrhoids: Secondary | ICD-10-CM | POA: Diagnosis not present

## 2016-03-19 DIAGNOSIS — E785 Hyperlipidemia, unspecified: Secondary | ICD-10-CM | POA: Diagnosis not present

## 2016-03-19 DIAGNOSIS — K625 Hemorrhage of anus and rectum: Secondary | ICD-10-CM | POA: Diagnosis not present

## 2016-03-19 DIAGNOSIS — D123 Benign neoplasm of transverse colon: Secondary | ICD-10-CM | POA: Diagnosis not present

## 2016-03-19 DIAGNOSIS — K640 First degree hemorrhoids: Secondary | ICD-10-CM | POA: Diagnosis not present

## 2016-03-19 DIAGNOSIS — R197 Diarrhea, unspecified: Secondary | ICD-10-CM | POA: Diagnosis not present

## 2016-03-19 HISTORY — PX: COLONOSCOPY WITH PROPOFOL: SHX5780

## 2016-03-19 LAB — GLUCOSE, CAPILLARY
GLUCOSE-CAPILLARY: 218 mg/dL — AB (ref 65–99)
GLUCOSE-CAPILLARY: 297 mg/dL — AB (ref 65–99)
Glucose-Capillary: 188 mg/dL — ABNORMAL HIGH (ref 65–99)

## 2016-03-19 SURGERY — COLONOSCOPY WITH PROPOFOL
Anesthesia: General

## 2016-03-19 MED ORDER — SODIUM CHLORIDE 0.9 % IV SOLN
INTRAVENOUS | Status: DC
  Administered 2016-03-19: 14:00:00 via INTRAVENOUS

## 2016-03-19 MED ORDER — PROPOFOL 500 MG/50ML IV EMUL
INTRAVENOUS | Status: DC | PRN
Start: 2016-03-19 — End: 2016-03-19
  Administered 2016-03-19: 120 ug/kg/min via INTRAVENOUS

## 2016-03-19 MED ORDER — PROPOFOL 10 MG/ML IV BOLUS
INTRAVENOUS | Status: DC | PRN
  Administered 2016-03-19: 90 mg via INTRAVENOUS

## 2016-03-19 MED ORDER — EPHEDRINE SULFATE-NACL 50-0.9 MG/10ML-% IV SOSY
PREFILLED_SYRINGE | INTRAVENOUS | Status: DC | PRN
  Administered 2016-03-19 (×2): 10 mg via INTRAVENOUS
  Administered 2016-03-19: 15 mg via INTRAVENOUS

## 2016-03-19 NOTE — Consult Note (Signed)
Patient colonoscopy showed a large AVM in transverse colon which was treated with argon and 3 clips.  Two small polyps removed with forceps, internal hemorrhoids noted.  Will advance to real food and home in morning.

## 2016-03-19 NOTE — Op Note (Signed)
[facility] Patient Name: Vanessa Vazquez Procedure Date: 03/19/2016 3:43 PM MRN: [id number] Account #: [id number] Date of Birth: 1934/06/14 Admit Type: Inpatient Age: 81 Room: [facility] ENDO ROOM 1 Gender: Female Note Status: Finalized Procedure:            Colonoscopy Indications:          Rectal bleeding Providers:            Manya Silvas, MD Referring MD:         Cletis Athens, MD (Referring MD) Medicines:            Propofol per Anesthesia Complications:        No immediate complications. Procedure:            Pre-Anesthesia Assessment:                       - After reviewing the risks and benefits, the patient                        was deemed in satisfactory condition to undergo the                        procedure.                       After obtaining informed consent, the colonoscope was                        passed under direct vision. Throughout the procedure,                        the patient's blood pressure, pulse, and oxygen                        saturations were monitored continuously. The                        Colonoscope was introduced through the anus and                        advanced to the the ascending colon. The colonoscopy                        was performed with difficulty due to significant                        looping. The patient tolerated the procedure fairly                        well. The quality of the bowel preparation was                        excellent. Findings:      Two sessile polyps were found in the ascending colon. The polyps were       diminutive in size. These polyps were removed with a jumbo cold forceps.       Resection and retrieval were complete.      A single large angioectasia without bleeding was found in the mid       transverse colon. Coagulation for tissue destruction using argon plasma       at 0.4 liters/minute  and 25 watts was successful. To prevent bleeding   post-intervention, three hemostatic clips were successfully placed.       There was no bleeding during, or at the end, of the procedure.      Internal hemorrhoids were found during endoscopy. The hemorrhoids were       small, medium-sized and Grade I (internal hemorrhoids that do not       prolapse).      The exam was otherwise without abnormality. Impression:           - Two diminutive polyps in the ascending colon, removed                        with a jumbo cold forceps. Resected and retrieved.                       - A single non-bleeding colonic angioectasia. Treated                        with argon plasma coagulation (APC). Clips were placed.                       - Internal hemorrhoids.                       - The examination was otherwise normal. Recommendation:       - Await pathology results. Anusol or Prep H for                        hemorrhoids. Can eat regular food and go home in                        morning. Manya Silvas, MD 03/19/2016 4:40:21 PM This report has been signed electronically. Number of Addenda: 0 Note Initiated On: 03/19/2016 3:43 PM Scope Withdrawal Time: 0 hours 30 minutes 53 seconds  Total Procedure Duration: 0 hours 41 minutes 8 seconds       [facility]

## 2016-03-19 NOTE — Progress Notes (Signed)
[place] at [place] NAME: Vanessa Vazquez    MR#:  [id number]  DATE OF BIRTH:  09-12-1934  SUBJECTIVE:  CHIEF COMPLAINT:   Chief Complaint  Patient presents with  . Rectal Bleeding   - admitted with rectal bleeding, has resolved now - for colonoscopy today  REVIEW OF SYSTEMS:  Review of Systems  Constitutional: Negative for chills, fever and malaise/fatigue.  HENT: Negative for ear discharge, ear pain and tinnitus.   Eyes: Negative for blurred vision and double vision.  Respiratory: Negative for cough, shortness of breath and wheezing.   Cardiovascular: Negative for chest pain, palpitations and leg swelling.  Gastrointestinal: Positive for blood in stool. Negative for abdominal pain, constipation, diarrhea, nausea and vomiting.  Genitourinary: Negative for dysuria and urgency.  Musculoskeletal: Negative for myalgias.  Neurological: Negative for dizziness, sensory change, speech change, focal weakness, seizures and headaches.  Psychiatric/Behavioral: Negative for depression.    DRUG ALLERGIES:  Not on File  VITALS:  Blood pressure (!) 111/42, pulse (!) 51, temperature 97.5 F (36.4 C), temperature source Tympanic, resp. rate 18, height 5\' 4"  (1.626 m), weight 106.6 kg (235 lb), SpO2 100 %.  PHYSICAL EXAMINATION:  Physical Exam  GENERAL:  81 y.o.-year-old patient lying in the bed with no acute distress.  EYES: Pupils equal, round, reactive to light and accommodation. No scleral icterus. Extraocular muscles intact.  HEENT: Head atraumatic, normocephalic. Oropharynx and nasopharynx clear.  NECK:  Supple, no jugular venous distention. No thyroid enlargement, no tenderness.  LUNGS: Normal breath sounds bilaterally, no wheezing, rales,rhonchi or crepitation. No use of accessory muscles of respiration.  CARDIOVASCULAR: S1, S2 normal. No  rubs, or gallops. 2/6 soft systolic murmur present. ABDOMEN: Soft, nontender, nondistended. Bowel  sounds present. No organomegaly or mass.  EXTREMITIES: No pedal edema, cyanosis, or clubbing.  NEUROLOGIC: Cranial nerves II through XII are intact. Muscle strength 5/5 in all extremities. Sensation intact. Gait not checked.  PSYCHIATRIC: The patient is alert and oriented x 3.  SKIN: No obvious rash, lesion, or ulcer.    LABORATORY PANEL:   CBC  Recent Labs Lab 03/17/16 1125  03/18/16 0847  WBC 5.5  --   --   HGB 12.3  < > 12.0  HCT 37.2  --   --   PLT 242  --   --   < > = values in this interval not displayed. ------------------------------------------------------------------------------------------------------------------  Chemistries   Recent Labs Lab 03/17/16 1125 03/18/16 0320  NA 134*  --   K 3.3*  --   CL 99*  --   CO2 27  --   GLUCOSE 221*  --   BUN 24*  --   CREATININE 0.93  --   CALCIUM 9.7  --   MG  --  1.6*  AST 26  --   ALT 20  --   ALKPHOS 81  --   BILITOT 0.7  --    ------------------------------------------------------------------------------------------------------------------  Cardiac Enzymes No results for input(s): TROPONINI in the last 168 hours. ------------------------------------------------------------------------------------------------------------------  RADIOLOGY:  No results found.  EKG:  No orders found for this or any previous visit.  ASSESSMENT AND PLAN:   81 year old female with past medical history significant for hypertension, diabetes mellitus, GERD and asthma presents to the hospital secondary to rectal bleed.  #1 rectal bleed-likely diverticular bleed. Has been spontaneous and resolved at this time. -Hemoglobin has been stable. Did not require any blood transfusion. -Appreciate GI consult. -For colonoscopy today. #2 diabetes  mellitus-metformin on hold. On sliding scale insulin.  #3 hypokalemia and hypomagnesemia-recheck tomorrow Discontinue HCTZ at discharge if persistently low  #4 hypertension-on atenolol, Lasix,  lisinopril and hydrochlorothiazide  #5 DVT prophylaxis-Ted's and SCDs only due to GI bleed   If colonoscopy is normal, advance diet and anticipate discharge tomorrow   All the records are reviewed and case discussed with Care Management/Social Workerr. Management plans discussed with the patient, family and they are in agreement.  CODE STATUS: Full Code  TOTAL TIME TAKING CARE OF THIS PATIENT: 38 minutes.   POSSIBLE D/C TOMORROW, DEPENDING ON CLINICAL CONDITION.   Gladstone Lighter M.D on 03/19/2016 at 2:19 PM  Between 7am to 6pm - Pager - [phone]  After 6pm go to [email or web address] - password [email or web address] [facility]  Office  [phone]  CC: Primary care physician; Cletis Athens, MD

## 2016-03-19 NOTE — Anesthesia Preprocedure Evaluation (Signed)
Anesthesia Evaluation  Patient identified by MRN, date of birth, ID band  Reviewed: Allergy & Precautions, NPO status , Patient's Chart, lab work & pertinent test results  History of Anesthesia Complications Negative for: history of anesthetic complications  Airway Mallampati: II  TM Distance: >3 FB Neck ROM: Full    Dental  (+) Poor Dentition   Pulmonary asthma , neg sleep apnea, former smoker,    breath sounds clear to auscultation- rhonchi (-) wheezing      Cardiovascular Exercise Tolerance: Good hypertension, Pt. on medications and Pt. on home beta blockers (-) CAD and (-) Past MI  Rhythm:Regular Rate:Normal - Systolic murmurs and - Diastolic murmurs    Neuro/Psych negative neurological ROS  negative psych ROS   GI/Hepatic Neg liver ROS, GERD  ,  Endo/Other  diabetes, Type 2, Oral Hypoglycemic Agents  Renal/GU negative Renal ROS     Musculoskeletal negative musculoskeletal ROS (+)   Abdominal (+) + obese,   Peds  Hematology negative hematology ROS (+)   Anesthesia Other Findings Past Medical History: No date: Asthma No date: Diabetes mellitus without complication (HCC) No date: GERD (gastroesophageal reflux disease) No date: Hypercholesteremia No date: Hypertension   Reproductive/Obstetrics                             Anesthesia Physical Anesthesia Plan  ASA: II  Anesthesia Plan: General   Post-op Pain Management:    Induction: Intravenous  Airway Management Planned: Natural Airway  Additional Equipment:   Intra-op Plan:   Post-operative Plan:   Informed Consent: I have reviewed the patients History and Physical, chart, labs and discussed the procedure including the risks, benefits and alternatives for the proposed anesthesia with the patient or authorized representative who has indicated his/her understanding and acceptance.   Dental advisory given  Plan Discussed  with: CRNA and Anesthesiologist  Anesthesia Plan Comments:         Anesthesia Quick Evaluation

## 2016-03-19 NOTE — Anesthesia Postprocedure Evaluation (Signed)
Anesthesia Post Note  Patient: Vanessa Vazquez  Procedure(s) Performed: Procedure(s) (LRB): COLONOSCOPY WITH PROPOFOL (N/A)  Patient location during evaluation: PACU Anesthesia Type: General Level of consciousness: awake and alert Pain management: pain level controlled Vital Signs Assessment: post-procedure vital signs reviewed and stable Respiratory status: spontaneous breathing, nonlabored ventilation, respiratory function stable and patient connected to nasal cannula oxygen Cardiovascular status: blood pressure returned to baseline and stable Postop Assessment: no signs of nausea or vomiting Anesthetic complications: no    Last Vitals:  Vitals:   03/19/16 1704 03/19/16 1727  BP: (!) 108/45 (!) 121/39  Pulse: 61 63  Resp: 11 18  Temp:  36.7 C    Last Pain:  Vitals:   03/19/16 1634  TempSrc: Tympanic                 Molli Barrows

## 2016-03-19 NOTE — Transfer of Care (Signed)
Immediate Anesthesia Transfer of Care Note  Patient: Vanessa Vazquez  Procedure(s) Performed: Procedure(s): COLONOSCOPY WITH PROPOFOL (N/A)  Patient Location: PACU and Endoscopy Unit  Anesthesia Type:General  Level of Consciousness: awake, patient cooperative and lethargic  Airway & Oxygen Therapy: Patient Spontanous Breathing and Patient connected to nasal cannula oxygen  Post-op Assessment: Report given to RN and Post -op Vital signs reviewed and stable  Post vital signs: Reviewed and stable  Last Vitals:  Vitals:   03/19/16 1414 03/19/16 1634  BP: (!) 111/42 (!) 139/96  Pulse: (!) 51 74  Resp: 18 (!) 23  Temp: 36.4 C 36.2 C    Last Pain:  Vitals:   03/19/16 1634  TempSrc: Tympanic         Complications: No apparent anesthesia complications

## 2016-03-20 ENCOUNTER — Encounter: Payer: Self-pay | Admitting: Unknown Physician Specialty

## 2016-03-20 DIAGNOSIS — E785 Hyperlipidemia, unspecified: Secondary | ICD-10-CM | POA: Diagnosis not present

## 2016-03-20 DIAGNOSIS — K625 Hemorrhage of anus and rectum: Secondary | ICD-10-CM | POA: Diagnosis not present

## 2016-03-20 DIAGNOSIS — D122 Benign neoplasm of ascending colon: Secondary | ICD-10-CM | POA: Diagnosis not present

## 2016-03-20 DIAGNOSIS — I10 Essential (primary) hypertension: Secondary | ICD-10-CM | POA: Diagnosis not present

## 2016-03-20 DIAGNOSIS — E876 Hypokalemia: Secondary | ICD-10-CM | POA: Diagnosis not present

## 2016-03-20 LAB — BASIC METABOLIC PANEL
Anion gap: 8 (ref 5–15)
BUN: 16 mg/dL (ref 6–20)
CALCIUM: 9 mg/dL (ref 8.9–10.3)
CHLORIDE: 100 mmol/L — AB (ref 101–111)
CO2: 29 mmol/L (ref 22–32)
CREATININE: 0.78 mg/dL (ref 0.44–1.00)
GFR calc non Af Amer: >60 mL/min (ref >60)
Glucose, Bld: 220 mg/dL — ABNORMAL HIGH (ref 65–99)
Potassium: 2.7 mmol/L — CL (ref 3.5–5.1)
Sodium: 137 mmol/L (ref 135–145)

## 2016-03-20 LAB — MAGNESIUM: Magnesium: 1.7 mg/dL (ref 1.7–2.4)

## 2016-03-20 LAB — GLUCOSE, CAPILLARY
Glucose-Capillary: 194 mg/dL — ABNORMAL HIGH (ref 65–99)
Glucose-Capillary: 223 mg/dL — ABNORMAL HIGH (ref 65–99)

## 2016-03-20 LAB — TYPE AND SCREEN
ABO/RH(D): O NEG
ANTIBODY SCREEN: POSITIVE
UNIT DIVISION: 0
Unit division: 0

## 2016-03-20 LAB — POTASSIUM: Potassium: 3.5 mmol/L (ref 3.5–5.1)

## 2016-03-20 MED ORDER — FUROSEMIDE 20 MG PO TABS: 20.0000 mg | ORAL_TABLET | Freq: Every day | ORAL | 0 refills | Status: DC | PRN

## 2016-03-20 MED ORDER — POTASSIUM CHLORIDE CRYS ER 20 MEQ PO TBCR
40.0000 meq | EXTENDED_RELEASE_TABLET | ORAL | Status: AC
  Administered 2016-03-20 (×2): 40 meq via ORAL
  Filled 2016-03-20 (×2): qty 2

## 2016-03-20 MED ORDER — SODIUM CHLORIDE 0.9 % IV BOLUS (SEPSIS)
500.0000 mL | INTRAVENOUS | Status: DC | PRN
  Administered 2016-03-20: 500 mL via INTRAVENOUS
  Filled 2016-03-20: qty 500

## 2016-03-20 MED ORDER — SODIUM CHLORIDE 0.9 % IV BOLUS (SEPSIS): 1000.0000 mL | INTRAVENOUS | Status: DC | PRN

## 2016-03-20 MED ORDER — POTASSIUM CHLORIDE 10 MEQ/100ML IV SOLN
10.0000 meq | INTRAVENOUS | Status: DC
Start: 2016-03-20 — End: 2016-03-20
  Administered 2016-03-20: 10 meq via INTRAVENOUS
  Filled 2016-03-20 (×4): qty 100

## 2016-03-20 NOTE — Progress Notes (Signed)
Pt was alert and happy for conversation. Mentioned that a Sherilyn Banker had visited from church. Pt felt well supported. CH offered prayer.   03/20/16 1010  Clinical Encounter Type  Visited With Patient  Visit Type Initial  Referral From Nurse  Spiritual Encounters  Spiritual Needs Prayer;Emotional  Stress Factors  Patient Stress Factors None identified

## 2016-03-20 NOTE — Discharge Summary (Addendum)
[facility] NAME: Vanessa Vazquez    MR#:  [id number]  DATE OF BIRTH:  07-Nov-1934  DATE OF ADMISSION:  03/17/2016   ADMITTING PHYSICIAN: Henreitta Leber, MD  DATE OF DISCHARGE: 03/20/2016  PRIMARY CARE PHYSICIAN: Cletis Athens, MD   ADMISSION DIAGNOSIS:   Acute GI bleeding [K92.2]  DISCHARGE DIAGNOSIS:   Active Problems:   GI bleed   SECONDARY DIAGNOSIS:   Past Medical History:  Diagnosis Date  . Asthma   . Diabetes mellitus without complication ([place])   . GERD (gastroesophageal reflux disease)   . Hypercholesteremia   . Hypertension     HOSPITAL COURSE:   81 year old female with past medical history significant for hypertension, diabetes mellitus, GERD and asthma presents to the hospital secondary to rectal bleed.  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 Treated with argon laser and 3 clips. Polyps were removed and also internal hemorrhoids were noted. -Patient able to tolerate regular diet. -Hemoglobin has been stable. Did not require any blood transfusion. -Appreciate GI consult. -Hold aspirin for 1 week   #2 diabetes mellitus-metformin and Amaryl restarted at discharge  #3 hypokalemia - being replaced. Discharge once potassium is greater than 3 -Discontinued Lasix and changed it to when necessary. Discontinued HCTZ at discharge  #4 hypertension- BP was low normal in the hospital. Continue on atenolol. Held Lisinopril, Lasix and hydrochlorothiazide at discharge. F/u with PCP.   Has been otherwise stable. For discharge today  DISCHARGE CONDITIONS:   Stable  CONSULTS OBTAINED:   Treatment Team:  Manya Silvas, MD Gladstone Lighter, MD  DRUG ALLERGIES:   Not on File DISCHARGE MEDICATIONS:     Medication List    STOP taking these medications   aspirin EC 81 MG tablet   fluconazole 150 MG tablet Commonly known as:  DIFLUCAN     hydrochlorothiazide 25 MG tablet Commonly known as:  HYDRODIURIL   lisinopril 10 MG tablet Commonly known as:  PRINIVIL,ZESTRIL     TAKE these medications   atenolol 25 MG tablet Commonly known as:  TENORMIN Take 25 mg by mouth daily.   atorvastatin 10 MG tablet Commonly known as:  LIPITOR Take 10 mg by mouth daily.   Cinnamon 500 MG capsule Take 1,000 mg by mouth daily.   Fish Oil 1000 MG Caps Take 1,000 mg by mouth daily.   furosemide 20 MG tablet Commonly known as:  LASIX Take 1 tablet (20 mg total) by mouth daily as needed for edema. What changed:  when to take this  reasons to take this   glimepiride 2 MG tablet Commonly known as:  AMARYL Take 2 mg by mouth 2 (two) times daily.   metFORMIN 1000 MG tablet Commonly known as:  GLUCOPHAGE Take 1,000 mg by mouth 2 (two) times daily.   multivitamin tablet Take 1 tablet by mouth daily.        DISCHARGE INSTRUCTIONS:   1. PCP follow-up in 1-2 weeks. Check basic metabolic panel at the time 2. GI follow up in 3 weeks  DIET:   Cardiac diet  ACTIVITY:   Activity as tolerated  OXYGEN:   Home Oxygen: No.  Oxygen Delivery: room air  DISCHARGE LOCATION:   home   If you experience worsening of your admission symptoms, develop shortness of breath, life threatening emergency, suicidal or homicidal thoughts you must seek medical attention immediately by calling 911 or calling your MD immediately  if symptoms less severe.  You Must read complete instructions/literature along with all the possible adverse reactions/side effects for all the Medicines you take and that have been prescribed to you. Take any new Medicines after you have completely understood and accpet all the possible adverse reactions/side effects.   Please note  You were cared for by a hospitalist during your hospital stay. If you have any questions about your discharge medications or the care you received while you were in the hospital after  you are discharged, you can call the unit and asked to speak with the hospitalist on call if the hospitalist that took care of you is not available. Once you are discharged, your primary care physician will handle any further medical issues. Please note that NO REFILLS for any discharge medications will be authorized once you are discharged, as it is imperative that you return to your primary care physician (or establish a relationship with a primary care physician if you do not have one) for your aftercare needs so that they can reassess your need for medications and monitor your lab values.    On the day of Discharge:  VITAL SIGNS:   Blood pressure (!) 114/49, pulse 72, temperature 97.6 F (36.4 C), temperature source Oral, resp. rate 20, height 5\' 4"  (1.626 m), weight 106.6 kg (235 lb), SpO2 93 %.  PHYSICAL EXAMINATION:    GENERAL:  81 y.o.-year-old patient lying in the bed with no acute distress.  EYES: Pupils equal, round, reactive to light and accommodation. No scleral icterus. Extraocular muscles intact.  HEENT: Head atraumatic, normocephalic. Oropharynx and nasopharynx clear.  NECK:  Supple, no jugular venous distention. No thyroid enlargement, no tenderness.  LUNGS: Normal breath sounds bilaterally, no wheezing, rales,rhonchi or crepitation. No use of accessory muscles of respiration.  CARDIOVASCULAR: S1, S2 normal. No  rubs, or gallops. 2/6 soft systolic murmur present. ABDOMEN: Soft, nontender, nondistended. Bowel sounds present. No organomegaly or mass.  EXTREMITIES: No pedal edema, cyanosis, or clubbing.  NEUROLOGIC: Cranial nerves II through XII are intact. Muscle strength 5/5 in all extremities. Sensation intact. Gait not checked.  PSYCHIATRIC: The patient is alert and oriented x 3.  SKIN: No obvious rash, lesion, or ulcer.   DATA REVIEW:   CBC  Recent Labs Lab 03/17/16 1125  03/18/16 0847  WBC 5.5  --   --   HGB 12.3  < > 12.0  HCT 37.2  --   --   PLT 242  --   --    < > = values in this interval not displayed.  Chemistries   Recent Labs Lab 03/17/16 1125  03/20/16 0421  NA 134*  --  137  K 3.3*  --  2.7*  CL 99*  --  100*  CO2 27  --  29  GLUCOSE 221*  --  220*  BUN 24*  --  16  CREATININE 0.93  --  0.78  CALCIUM 9.7  --  9.0  MG  --   < > 1.7  AST 26  --   --   ALT 20  --   --   ALKPHOS 81  --   --   BILITOT 0.7  --   --   < > = values in this interval not displayed.   Microbiology Results  No results found for this or any previous visit.  RADIOLOGY:  No results found.   Management plans discussed with the patient, family and they are in agreement.  CODE STATUS:  Code Status Orders        Start     Ordered   03/17/16 1448  Full code  Continuous     03/17/16 1447    Code Status History    Date Active Date Inactive Code Status Order ID Comments User Context   This patient has a current code status but no historical code status.      TOTAL TIME TAKING CARE OF THIS PATIENT: 37 minutes.    Gladstone Lighter M.D on 03/20/2016 at 7:34 AM  Between 7am to 6pm - Pager - [phone]  After 6pm go to [email or web address] - Proofreader  [facility]  Office  [phone]  CC: Primary care physician; Cletis Athens, MD   Note: This dictation was prepared with Dragon dictation along with smaller phrase technology. Any transcriptional errors that result from this process are unintentional.

## 2016-03-20 NOTE — Progress Notes (Signed)
Began potassium infusion per MD order; stayed in room with patient to monitor initial vein response.  About fifteen minutes into infusion patient became diaphoretic, complained of feeling weak, "sweaty and hot".  VS obtained, BP 70/47 repeat on opposite arm 74/41.  Potassium infusion stopped and Dr. Tressia Miners notified who came to see patient.  NS infusing initially at 50-ml/hr then increased per Dr. Tressia Miners to 500-ml/hr x1 hr.  BP improved to 104/55 and patient feels symptoms are subsiding.  RN remained in room with patient throughout episode.

## 2016-03-20 NOTE — Progress Notes (Signed)
Notified dr.diamond of pt potassium of 2.7. Acknowledged and orders to follow.

## 2016-03-20 NOTE — Discharge Instructions (Signed)
Gastrointestinal Bleeding °Gastrointestinal bleeding is bleeding somewhere along the path that food travels through the body (digestive tract). This path is anywhere between the mouth and the opening of the butt (anus). You may have blood in your throw up (vomit) or in your poop (stools). If there is a lot of bleeding, you may need to stay in the hospital. °HOME CARE °· Only take medicine as told by your doctor. °· Eat foods with fiber such as whole grains, fruits, and vegetables. You can also try eating 1 to 3 prunes a day. °· Drink enough fluids to keep your pee (urine) clear or pale yellow. °GET HELP RIGHT AWAY IF:  °· Your bleeding gets worse. °· You feel dizzy, weak, or you pass out (faint). °· You have bad cramps in your back or belly (abdomen). °· You have large blood clumps (clots) in your poop. °· Your problems are getting worse. °MAKE SURE YOU:  °· Understand these instructions. °· Will watch your condition. °· Will get help right away if you are not doing well or get worse. °  °This information is not intended to replace advice given to you by your health care provider. Make sure you discuss any questions you have with your health care provider. °  °Document Released: 02/21/2008 Document Revised: 04/30/2012 Document Reviewed: 11/01/2014 °Elsevier Interactive Patient Education ©2016 Elsevier Inc. ° °

## 2016-03-20 NOTE — Progress Notes (Signed)
Patient A&O, BP improved.  Discharge instructions reviewed in detail, including medication changes.  Understanding was verbalized and all questions were answered.  Patient discharged home via wheelchair in stable condition escorted by volunteer staff.

## 2016-03-21 LAB — SURGICAL PATHOLOGY

## 2016-03-28 DIAGNOSIS — M7040 Prepatellar bursitis, unspecified knee: Secondary | ICD-10-CM | POA: Diagnosis not present

## 2016-03-28 DIAGNOSIS — E784 Other hyperlipidemia: Secondary | ICD-10-CM | POA: Diagnosis not present

## 2016-03-28 DIAGNOSIS — E8881 Metabolic syndrome: Secondary | ICD-10-CM | POA: Diagnosis not present

## 2016-03-28 DIAGNOSIS — E119 Type 2 diabetes mellitus without complications: Secondary | ICD-10-CM | POA: Diagnosis not present

## 2016-03-29 DIAGNOSIS — R5381 Other malaise: Secondary | ICD-10-CM | POA: Diagnosis not present

## 2016-03-29 DIAGNOSIS — R110 Nausea: Secondary | ICD-10-CM | POA: Diagnosis not present

## 2016-04-27 DIAGNOSIS — E784 Other hyperlipidemia: Secondary | ICD-10-CM | POA: Diagnosis not present

## 2016-04-27 DIAGNOSIS — M7040 Prepatellar bursitis, unspecified knee: Secondary | ICD-10-CM | POA: Diagnosis not present

## 2016-04-27 DIAGNOSIS — E8881 Metabolic syndrome: Secondary | ICD-10-CM | POA: Diagnosis not present

## 2016-04-27 DIAGNOSIS — E119 Type 2 diabetes mellitus without complications: Secondary | ICD-10-CM | POA: Diagnosis not present

## 2016-05-08 DIAGNOSIS — Q2733 Arteriovenous malformation of digestive system vessel: Secondary | ICD-10-CM | POA: Diagnosis not present

## 2016-05-08 DIAGNOSIS — I10 Essential (primary) hypertension: Secondary | ICD-10-CM | POA: Insufficient documentation

## 2016-05-11 DIAGNOSIS — K5521 Angiodysplasia of colon with hemorrhage: Secondary | ICD-10-CM | POA: Insufficient documentation

## 2016-05-29 DIAGNOSIS — E784 Other hyperlipidemia: Secondary | ICD-10-CM | POA: Diagnosis not present

## 2016-05-29 DIAGNOSIS — E119 Type 2 diabetes mellitus without complications: Secondary | ICD-10-CM | POA: Diagnosis not present

## 2016-05-29 DIAGNOSIS — M545 Low back pain: Secondary | ICD-10-CM | POA: Diagnosis not present

## 2016-05-29 DIAGNOSIS — M7040 Prepatellar bursitis, unspecified knee: Secondary | ICD-10-CM | POA: Diagnosis not present

## 2016-07-27 DIAGNOSIS — E784 Other hyperlipidemia: Secondary | ICD-10-CM | POA: Diagnosis not present

## 2016-07-27 DIAGNOSIS — M7040 Prepatellar bursitis, unspecified knee: Secondary | ICD-10-CM | POA: Diagnosis not present

## 2016-07-27 DIAGNOSIS — E119 Type 2 diabetes mellitus without complications: Secondary | ICD-10-CM | POA: Diagnosis not present

## 2016-07-27 DIAGNOSIS — M545 Low back pain: Secondary | ICD-10-CM | POA: Diagnosis not present

## 2016-10-08 DIAGNOSIS — G5790 Unspecified mononeuropathy of unspecified lower limb: Secondary | ICD-10-CM | POA: Diagnosis not present

## 2016-10-08 DIAGNOSIS — E784 Other hyperlipidemia: Secondary | ICD-10-CM | POA: Diagnosis not present

## 2016-10-08 DIAGNOSIS — E119 Type 2 diabetes mellitus without complications: Secondary | ICD-10-CM | POA: Diagnosis not present

## 2016-10-08 DIAGNOSIS — R5381 Other malaise: Secondary | ICD-10-CM | POA: Diagnosis not present

## 2016-10-08 DIAGNOSIS — I10 Essential (primary) hypertension: Secondary | ICD-10-CM | POA: Diagnosis not present

## 2016-10-08 DIAGNOSIS — E8881 Metabolic syndrome: Secondary | ICD-10-CM | POA: Diagnosis not present

## 2016-10-17 DIAGNOSIS — I10 Essential (primary) hypertension: Secondary | ICD-10-CM | POA: Diagnosis not present

## 2016-10-17 DIAGNOSIS — M7040 Prepatellar bursitis, unspecified knee: Secondary | ICD-10-CM | POA: Diagnosis not present

## 2016-10-17 DIAGNOSIS — I498 Other specified cardiac arrhythmias: Secondary | ICD-10-CM | POA: Diagnosis not present

## 2016-10-17 DIAGNOSIS — D5701 Hb-SS disease with acute chest syndrome: Secondary | ICD-10-CM | POA: Diagnosis not present

## 2016-10-26 ENCOUNTER — Ambulatory Visit: Payer: Medicare HMO

## 2016-10-26 ENCOUNTER — Encounter: Payer: Self-pay | Admitting: Podiatry

## 2016-10-26 ENCOUNTER — Ambulatory Visit (INDEPENDENT_AMBULATORY_CARE_PROVIDER_SITE_OTHER): Payer: Medicare HMO | Admitting: Podiatry

## 2016-10-26 DIAGNOSIS — M79672 Pain in left foot: Principal | ICD-10-CM

## 2016-10-26 DIAGNOSIS — M79671 Pain in right foot: Secondary | ICD-10-CM

## 2016-10-26 DIAGNOSIS — E0842 Diabetes mellitus due to underlying condition with diabetic polyneuropathy: Secondary | ICD-10-CM | POA: Diagnosis not present

## 2016-10-26 MED ORDER — NONFORMULARY OR COMPOUNDED ITEM: 2 refills | Status: DC

## 2016-10-27 NOTE — Progress Notes (Signed)
   HPI: Patient is a 82 year old female presenting today with a complaint of intermittent tingling diffusely to bilateral feet that has been ongoing for the past 4 months. She states her symptoms are worsened night when she lies down. She is here for further evaluation and treatment.   Physical Exam: General: The patient is alert and oriented x3 in no acute distress.  Dermatology: Skin is warm, dry and supple bilateral lower extremities. Negative for open lesions or macerations.  Vascular: Palpable pedal pulses bilaterally. No edema or erythema noted. Capillary refill within normal limits.  Neurological: Epicritic and protective threshold grossly intact bilaterally.   Musculoskeletal Exam: Range of motion within normal limits to all pedal and ankle joints bilateral. Muscle strength 5/5 in all groups bilateral.    Assessment: 1. Diabetes mellitus with neuropathy   Plan of Care:  1. Patient was evaluated. 2. Prescription for peripheral neuropathy pain cream to be dispensed from [facility]. 3. Return to clinic when necessary.   Edrick Kins, DPM [facility]  Dr. Edrick Kins, [address]                                        [place], [place] [address]                Office [phone]  Fax [phone]

## 2016-11-07 DIAGNOSIS — I10 Essential (primary) hypertension: Secondary | ICD-10-CM | POA: Diagnosis not present

## 2016-11-07 DIAGNOSIS — G5790 Unspecified mononeuropathy of unspecified lower limb: Secondary | ICD-10-CM | POA: Diagnosis not present

## 2016-11-07 DIAGNOSIS — J209 Acute bronchitis, unspecified: Secondary | ICD-10-CM | POA: Diagnosis not present

## 2016-11-07 DIAGNOSIS — E119 Type 2 diabetes mellitus without complications: Secondary | ICD-10-CM | POA: Diagnosis not present

## 2016-11-22 ENCOUNTER — Encounter: Payer: Self-pay | Admitting: Emergency Medicine

## 2016-11-22 ENCOUNTER — Emergency Department
Admission: EM | Admit: 2016-11-22 | Discharge: 2016-11-22 | Disposition: A | Discharge status: Home / Self Care | Payer: Medicare HMO | Attending: Emergency Medicine | Admitting: Emergency Medicine

## 2016-11-22 ENCOUNTER — Emergency Department: Payer: Medicare HMO

## 2016-11-22 DIAGNOSIS — I10 Essential (primary) hypertension: Secondary | ICD-10-CM | POA: Insufficient documentation

## 2016-11-22 DIAGNOSIS — J45909 Unspecified asthma, uncomplicated: Secondary | ICD-10-CM | POA: Diagnosis not present

## 2016-11-22 DIAGNOSIS — Z7984 Long term (current) use of oral hypoglycemic drugs: Secondary | ICD-10-CM | POA: Diagnosis not present

## 2016-11-22 DIAGNOSIS — M25551 Pain in right hip: Secondary | ICD-10-CM | POA: Insufficient documentation

## 2016-11-22 DIAGNOSIS — Z79899 Other long term (current) drug therapy: Secondary | ICD-10-CM | POA: Diagnosis not present

## 2016-11-22 DIAGNOSIS — J069 Acute upper respiratory infection, unspecified: Secondary | ICD-10-CM | POA: Diagnosis not present

## 2016-11-22 DIAGNOSIS — Z87891 Personal history of nicotine dependence: Secondary | ICD-10-CM | POA: Insufficient documentation

## 2016-11-22 DIAGNOSIS — R05 Cough: Secondary | ICD-10-CM | POA: Diagnosis not present

## 2016-11-22 MED ORDER — BENZONATATE 100 MG PO CAPS: 100.0000 mg | ORAL_CAPSULE | Freq: Three times a day (TID) | ORAL | 0 refills | Status: AC | PRN

## 2016-11-22 MED ORDER — TRAMADOL HCL 50 MG PO TABS: ORAL_TABLET | ORAL | 0 refills | Status: DC

## 2016-11-22 NOTE — ED Provider Notes (Signed)
[facility] Emergency Department Provider Note  ____________________________________________   First MD Initiated Contact with Patient 11/22/16 1150     (approximate)  I have reviewed the triage vital signs and the nursing notes.   HISTORY  Chief Complaint Hip Pain and Nasal Congestion    HPI Vanessa Vazquez is a 82 y.o. female is in the emergency room with complaint of congestion and right hip pain. Patient states that she was seen by her PCP and given some antibiotics which has helped greatly but she continues to have some coughing. Patient began having right hip pain approximate 4 days ago and has not improved with Tylenol. Patient states pain is increased with weightbearing or walking. She denies any previous problems with her hip. She denies any fever or chills with her cold symptoms. She continues to eat normally. Patient is walking with the aid of a cane. At this time she denies any pain as she is sitting in a wheelchair.   Past Medical History:  Diagnosis Date  . Asthma   . Diabetes mellitus without complication ([place])   . GERD (gastroesophageal reflux disease)   . Hypercholesteremia   . Hypertension     Patient Active Problem List   Diagnosis Date Noted  . GI bleed 03/17/2016  . Vaginal odor 12/14/2015  . Vulvar itching 12/14/2015  . Vaginal leukorrhea 12/14/2015  . Hypertension   . Hypercholesteremia   . Asthma     Past Surgical History:  Procedure Laterality Date  . CESAREAN SECTION    . COLONOSCOPY WITH PROPOFOL N/A 03/19/2016   Procedure: COLONOSCOPY WITH PROPOFOL;  Surgeon: Manya Silvas, MD;  Location: [facility] ENDOSCOPY;  Service: Endoscopy;  Laterality: N/A;    Prior to Admission medications   Medication Sig Start Date End Date Taking? Authorizing Provider  atenolol (TENORMIN) 25 MG tablet Take 25 mg by mouth daily.     [provider]  atorvastatin (LIPITOR) 10 MG tablet Take 10 mg by mouth daily.     [provider]  benzonatate (TESSALON PERLES) 100 MG capsule Take 1 capsule (100 mg total) by mouth 3 (three) times daily as needed for cough. 11/22/16 11/22/17  Johnn Hai, PA-C  Cinnamon 500 MG capsule Take 1,000 mg by mouth daily.     [provider]  furosemide (LASIX) 20 MG tablet Take 1 tablet (20 mg total) by mouth daily as needed for edema. 03/20/16   Gladstone Lighter, MD  glimepiride (AMARYL) 2 MG tablet Take 2 mg by mouth 2 (two) times daily.     [provider]  metFORMIN (GLUCOPHAGE) 1000 MG tablet Take 1,000 mg by mouth 2 (two) times daily.     [provider]  Multiple Vitamin (MULTIVITAMIN) tablet Take 1 tablet by mouth daily.    [provider]  NONFORMULARY OR COMPOUNDED ITEM [facility]  Peripheral Neuropathy Cream- Bupivacaine 1%, Doxepin 3%, Gabapentin 6%, Pentoxifylline 3%, Topiramate 1% Apply 1-2 grams to affected area 3-4 times daily Qty. 120 gm 3 refills 10/26/16   Edrick Kins, DPM  Omega-3 Fatty Acids (FISH OIL) 1000 MG CAPS Take 1,000 mg by mouth daily.     [provider]  traMADol (ULTRAM) 50 MG tablet 1 tablet every 6-8 hours as needed for pain. 11/22/16   Johnn Hai, PA-C    Allergies Patient has no known allergies.  Family History  Problem Relation Age of Onset  . Lung cancer Father   . Leukemia Sister   .  Diabetes Sister   . Asthma Child     Social History Social History  Substance Use Topics  . Smoking status: Former Research scientist (life sciences)  . Smokeless tobacco: Never Used  . Alcohol use No    Review of Systems Constitutional: No fever/chills Eyes: No visual changes. ENT: No sore throat.Positive for nasal congestion. Cardiovascular: Denies chest pain. Respiratory: Denies shortness of breath. Positive for cough. Musculoskeletal: Positive for right hip pain. Skin: Negative for rash. Neurological: Negative for  focal weakness or  numbness.   ____________________________________________   PHYSICAL EXAM:  VITAL SIGNS: ED Triage Vitals  Enc Vitals Group     BP 11/22/16 1118 (!) 180/89     Pulse Rate 11/22/16 1118 72     Resp 11/22/16 1118 18     Temp 11/22/16 1118 98.5 F (36.9 C)     Temp Source 11/22/16 1118 Oral     SpO2 11/22/16 1118 96 %     Weight 11/22/16 1119 (!) 337 lb (152.9 kg)     Height 11/22/16 1119 5\' 2"  (1.575 m)     Head Circumference --      Peak Flow --      Pain Score 11/22/16 1118 0     Pain Loc --      Pain Edu? --      Excl. in [place]? --    Constitutional: Alert and oriented. Well appearing and in no acute distress. Large body habitus Eyes: Conjunctivae are normal. PERRL. EOMI. Head: Atraumatic. Nose: Minimal congestion/rhinnorhea. Neck: No stridor.   Cardiovascular: Normal rate, regular rhythm. Grossly normal heart sounds.  Good peripheral circulation. Respiratory: Normal respiratory effort.  No retractions. Lungs CTAB. Musculoskeletal: There is tenderness on palpation of the right posterior hip. Patient is able to move lower extremity without assistance. Patient states it is worse with weightbearing. Neurologic:  Normal speech and language. No gross focal neurologic deficits are appreciated.  Skin:  Skin is warm, dry and intact.  Psychiatric: Mood and affect are normal. Speech and behavior are normal.  ____________________________________________   LABS (all labs ordered are listed, but only abnormal results are displayed)  Labs Reviewed - No data to display ____________________________________________   RADIOLOGY  Dg Hip Unilat W Or Wo Pelvis 2-3 Views Right  Result Date: 11/22/2016 CLINICAL DATA:  82 year old female with right hip pain for 4 days. Pain with walking. EXAM: DG HIP (WITH OR WITHOUT PELVIS) 2-3V RIGHT COMPARISON:  Right hip series 04/07/15. FINDINGS: Femoral heads remain normally located. Hip joint spaces appear stable and normal for age. The pelvis appears  stable and intact. Sacral ala and SI joints appear stable and intact. Grossly intact proximal left femur. Intact proximal right femur. IMPRESSION: No acute osseous abnormality identified about the right hip or pelvis. Electronically Signed   By: Genevie Ann M.D.   On: 11/22/2016 12:37    ____________________________________________   PROCEDURES  Procedure(s) performed: None  Procedures  Critical Care performed: No  ____________________________________________   INITIAL IMPRESSION / ASSESSMENT AND PLAN / ED COURSE  Pertinent labs & imaging results that were available during my care of the patient were reviewed by me and considered in my medical decision making (see chart for details).  Patient is given prescription for Tessalon Perles one every 8 hours if needed for cough. She is also given a prescription for tramadol 1 tablet every 6-8 hours as needed for pain.  Patient was made aware that this medication could cause drowsiness increase her risk for falling. She is encouraged to  use ice or heat to her hip as needed for comfort. She will follow-up with her PCP if any continued problems.    ___________________________________________   FINAL CLINICAL IMPRESSION(S) / ED DIAGNOSES  Final diagnoses:  Acute upper respiratory infection  Right hip pain      NEW MEDICATIONS STARTED DURING THIS VISIT:  Discharge Medication List as of 11/22/2016  1:04 PM    START taking these medications   Details  benzonatate (TESSALON PERLES) 100 MG capsule Take 1 capsule (100 mg total) by mouth 3 (three) times daily as needed for cough., Starting Thu 11/22/2016, Until Fri 11/22/2017, Print    traMADol (ULTRAM) 50 MG tablet 1 tablet every 6-8 hours as needed for pain., Print         Note:  This document was prepared using Dragon voice recognition software and may include unintentional dictation errors.    Johnn Hai, PA-C 11/22/16 1638    Nance Pear, MD 11/23/16 [phone]

## 2016-11-22 NOTE — Discharge Instructions (Signed)
Follow-up with your primary care doctor if any continued problems. Tessalon one every 8 hours as needed for cough. This medication will cause drowsiness. Use ice or heat to your hip as needed for comfort. Take tramadol 1 every 6-8 hours if needed for hip pain. Be aware that this medication could cause drowsiness increase your risk for falling.

## 2016-11-22 NOTE — ED Triage Notes (Signed)
Patient presents to the ED with congestion and right hip pain.  Patient states, "I've had a really bad cold and I saw Dr. Lavera Guise and he gave me some antibiotics so now what I'm coughing up is clear."  Patient also reports an episode this morning, "it was like I could breathe, but I felt like I couldn't breathe."  Patient's respirations are non-labored at this time and patient is in no obvious distress.  Speaking clearly in full sentences without difficulty.  Patient is also complaining of right hip pain that began on Sunday.  Patient states she is not having any pain sitting in wheelchair but when she tries to stand or walk she has pain that radiates from her hip around into her groin area.  Patient is in no obvious distress at this time.

## 2016-12-12 DIAGNOSIS — E8881 Metabolic syndrome: Secondary | ICD-10-CM | POA: Diagnosis not present

## 2016-12-12 DIAGNOSIS — G5790 Unspecified mononeuropathy of unspecified lower limb: Secondary | ICD-10-CM | POA: Diagnosis not present

## 2016-12-12 DIAGNOSIS — I498 Other specified cardiac arrhythmias: Secondary | ICD-10-CM | POA: Diagnosis not present

## 2016-12-12 DIAGNOSIS — E784 Other hyperlipidemia: Secondary | ICD-10-CM | POA: Diagnosis not present

## 2017-07-15 DIAGNOSIS — I498 Other specified cardiac arrhythmias: Secondary | ICD-10-CM | POA: Diagnosis not present

## 2017-07-15 DIAGNOSIS — E119 Type 2 diabetes mellitus without complications: Secondary | ICD-10-CM | POA: Diagnosis not present

## 2017-07-15 DIAGNOSIS — E8881 Metabolic syndrome: Secondary | ICD-10-CM | POA: Diagnosis not present

## 2017-07-15 DIAGNOSIS — I10 Essential (primary) hypertension: Secondary | ICD-10-CM | POA: Diagnosis not present

## 2017-10-14 DIAGNOSIS — E119 Type 2 diabetes mellitus without complications: Secondary | ICD-10-CM | POA: Diagnosis not present

## 2017-10-14 DIAGNOSIS — E8881 Metabolic syndrome: Secondary | ICD-10-CM | POA: Diagnosis not present

## 2017-10-14 DIAGNOSIS — I10 Essential (primary) hypertension: Secondary | ICD-10-CM | POA: Diagnosis not present

## 2017-10-14 DIAGNOSIS — E785 Hyperlipidemia, unspecified: Secondary | ICD-10-CM | POA: Diagnosis not present

## 2017-10-15 DIAGNOSIS — R5381 Other malaise: Secondary | ICD-10-CM | POA: Diagnosis not present

## 2017-10-15 DIAGNOSIS — E7849 Other hyperlipidemia: Secondary | ICD-10-CM | POA: Diagnosis not present

## 2017-10-15 DIAGNOSIS — E119 Type 2 diabetes mellitus without complications: Secondary | ICD-10-CM | POA: Diagnosis not present

## 2017-10-15 DIAGNOSIS — R110 Nausea: Secondary | ICD-10-CM | POA: Diagnosis not present

## 2017-10-15 DIAGNOSIS — I10 Essential (primary) hypertension: Secondary | ICD-10-CM | POA: Diagnosis not present

## 2017-10-23 DIAGNOSIS — E119 Type 2 diabetes mellitus without complications: Secondary | ICD-10-CM | POA: Diagnosis not present

## 2017-10-23 DIAGNOSIS — E8881 Metabolic syndrome: Secondary | ICD-10-CM | POA: Diagnosis not present

## 2017-10-23 DIAGNOSIS — E785 Hyperlipidemia, unspecified: Secondary | ICD-10-CM | POA: Diagnosis not present

## 2017-10-23 DIAGNOSIS — I10 Essential (primary) hypertension: Secondary | ICD-10-CM | POA: Diagnosis not present

## 2017-12-24 DIAGNOSIS — I10 Essential (primary) hypertension: Secondary | ICD-10-CM | POA: Diagnosis not present

## 2017-12-24 DIAGNOSIS — E785 Hyperlipidemia, unspecified: Secondary | ICD-10-CM | POA: Diagnosis not present

## 2017-12-24 DIAGNOSIS — E119 Type 2 diabetes mellitus without complications: Secondary | ICD-10-CM | POA: Diagnosis not present

## 2017-12-31 DIAGNOSIS — I739 Peripheral vascular disease, unspecified: Secondary | ICD-10-CM | POA: Diagnosis not present

## 2017-12-31 DIAGNOSIS — E119 Type 2 diabetes mellitus without complications: Secondary | ICD-10-CM | POA: Diagnosis not present

## 2017-12-31 DIAGNOSIS — I10 Essential (primary) hypertension: Secondary | ICD-10-CM | POA: Diagnosis not present

## 2017-12-31 DIAGNOSIS — E785 Hyperlipidemia, unspecified: Secondary | ICD-10-CM | POA: Diagnosis not present

## 2017-12-31 DIAGNOSIS — E8881 Metabolic syndrome: Secondary | ICD-10-CM | POA: Diagnosis not present

## 2018-02-05 ENCOUNTER — Emergency Department: Payer: Medicare HMO

## 2018-02-05 ENCOUNTER — Emergency Department
Admission: EM | Admit: 2018-02-05 | Discharge: 2018-02-05 | Disposition: A | Discharge status: Home / Self Care | Payer: Medicare HMO | Attending: Emergency Medicine | Admitting: Emergency Medicine

## 2018-02-05 ENCOUNTER — Encounter: Payer: Self-pay | Admitting: Emergency Medicine

## 2018-02-05 ENCOUNTER — Other Ambulatory Visit: Payer: Self-pay

## 2018-02-05 DIAGNOSIS — W108XXA Fall (on) (from) other stairs and steps, initial encounter: Secondary | ICD-10-CM | POA: Diagnosis not present

## 2018-02-05 DIAGNOSIS — Z87891 Personal history of nicotine dependence: Secondary | ICD-10-CM | POA: Insufficient documentation

## 2018-02-05 DIAGNOSIS — J45909 Unspecified asthma, uncomplicated: Secondary | ICD-10-CM | POA: Insufficient documentation

## 2018-02-05 DIAGNOSIS — S0990XA Unspecified injury of head, initial encounter: Secondary | ICD-10-CM

## 2018-02-05 DIAGNOSIS — Y9222 Religious institution as the place of occurrence of the external cause: Secondary | ICD-10-CM | POA: Insufficient documentation

## 2018-02-05 DIAGNOSIS — S199XXA Unspecified injury of neck, initial encounter: Secondary | ICD-10-CM | POA: Diagnosis not present

## 2018-02-05 DIAGNOSIS — E119 Type 2 diabetes mellitus without complications: Secondary | ICD-10-CM | POA: Insufficient documentation

## 2018-02-05 DIAGNOSIS — Y999 Unspecified external cause status: Secondary | ICD-10-CM | POA: Diagnosis not present

## 2018-02-05 DIAGNOSIS — Z79899 Other long term (current) drug therapy: Secondary | ICD-10-CM | POA: Insufficient documentation

## 2018-02-05 DIAGNOSIS — I10 Essential (primary) hypertension: Secondary | ICD-10-CM | POA: Diagnosis not present

## 2018-02-05 DIAGNOSIS — Z7984 Long term (current) use of oral hypoglycemic drugs: Secondary | ICD-10-CM | POA: Diagnosis not present

## 2018-02-05 DIAGNOSIS — M542 Cervicalgia: Secondary | ICD-10-CM | POA: Diagnosis not present

## 2018-02-05 DIAGNOSIS — E041 Nontoxic single thyroid nodule: Secondary | ICD-10-CM | POA: Diagnosis not present

## 2018-02-05 DIAGNOSIS — Y9389 Activity, other specified: Secondary | ICD-10-CM | POA: Insufficient documentation

## 2018-02-05 DIAGNOSIS — S0083XA Contusion of other part of head, initial encounter: Secondary | ICD-10-CM | POA: Diagnosis not present

## 2018-02-05 MED ORDER — ACETAMINOPHEN 325 MG PO TABS
ORAL_TABLET | ORAL | Status: AC
  Administered 2018-02-05: 650 mg
  Filled 2018-02-05: qty 2

## 2018-02-05 NOTE — ED Triage Notes (Signed)
Pt reports falling at church when she missed a step. Pt landed forward onto concrete and hit forehead. Pt c/o head pain only. Redness but no swelling noted to area.

## 2018-02-05 NOTE — ED Provider Notes (Signed)
[facility] Emergency Department Provider Note   First MD Initiated Contact with Patient 02/05/18 2311     (approximate)  I have reviewed the triage vital signs and the nursing notes.   HISTORY  Chief Complaint Fall    HPI Vanessa Vazquez is a 83 y.o. female with bolus of current medical conditions presents to the emergency department with history of accidental fall with forehead injury while at church tonight.patient states that she missed a step and subsequently fell forward onto concrete hitting her forehead. Patient admits to 5 of 10 headache at present. Patient denies any loss of consciousness no weakness numbness gait instability or visual changes following the event.   Past Medical History:  Diagnosis Date  . Asthma   . Diabetes mellitus without complication ([place])   . GERD (gastroesophageal reflux disease)   . Hypercholesteremia   . Hypertension     Patient Active Problem List   Diagnosis Date Noted  . GI bleed 03/17/2016  . Vaginal odor 12/14/2015  . Vulvar itching 12/14/2015  . Vaginal leukorrhea 12/14/2015  . Hypertension   . Hypercholesteremia   . Asthma     Past Surgical History:  Procedure Laterality Date  . CESAREAN SECTION    . COLONOSCOPY WITH PROPOFOL N/A 03/19/2016   Procedure: COLONOSCOPY WITH PROPOFOL;  Surgeon: Manya Silvas, MD;  Location: [facility] ENDOSCOPY;  Service: Endoscopy;  Laterality: N/A;    Prior to Admission medications   Medication Sig Start Date End Date Taking? Authorizing Provider  atenolol (TENORMIN) 25 MG tablet Take 25 mg by mouth daily.     [provider]  atorvastatin (LIPITOR) 10 MG tablet Take 10 mg by mouth daily.     [provider]  Cinnamon 500 MG capsule Take 1,000 mg by mouth daily.     [provider]  furosemide (LASIX) 20 MG tablet Take 1 tablet (20 mg total) by mouth daily as needed for edema. 03/20/16   Gladstone Lighter, MD  glimepiride (AMARYL) 2 MG tablet  Take 2 mg by mouth 2 (two) times daily.     [provider]  metFORMIN (GLUCOPHAGE) 1000 MG tablet Take 1,000 mg by mouth 2 (two) times daily.     [provider]  Multiple Vitamin (MULTIVITAMIN) tablet Take 1 tablet by mouth daily.    [provider]  NONFORMULARY OR COMPOUNDED ITEM [facility]  Peripheral Neuropathy Cream- Bupivacaine 1%, Doxepin 3%, Gabapentin 6%, Pentoxifylline 3%, Topiramate 1% Apply 1-2 grams to affected area 3-4 times daily Qty. 120 gm 3 refills 10/26/16   Edrick Kins, DPM  Omega-3 Fatty Acids (FISH OIL) 1000 MG CAPS Take 1,000 mg by mouth daily.     [provider]  traMADol (ULTRAM) 50 MG tablet 1 tablet every 6-8 hours as needed for pain. 11/22/16   Johnn Hai, PA-C    Allergies no known drug allergies  Family History  Problem Relation Age of Onset  . Lung cancer Father   . Leukemia Sister   . Diabetes Sister   . Asthma Child     Social History Social History   Tobacco Use  . Smoking status: Former Research scientist (life sciences)  . Smokeless tobacco: Never Used  Substance Use Topics  . Alcohol use: No    Alcohol/week: 0.0 standard drinks  . Drug use: No    Review of Systems Constitutional: No fever/chills Eyes: No visual changes. ENT: No sore throat. Cardiovascular: Denies chest pain. Respiratory: Denies shortness of breath. Gastrointestinal: No  abdominal pain.  No nausea, no vomiting.  No diarrhea.  No constipation. Genitourinary: Negative for dysuria. Musculoskeletal: Negative for neck pain.  Negative for back pain. Integumentary: Negative for rash. Neurological: Negative for headaches, focal weakness or numbness.   ____________________________________________   PHYSICAL EXAM:  VITAL SIGNS: ED Triage Vitals [02/05/18 2132]  Enc Vitals Group     BP (!) 173/67     Pulse Rate 95     Resp 17     Temp 97.6 F (36.4 C)     Temp Source Oral     SpO2 97 %     Weight      Height      Head Circumference       Peak Flow      Pain Score      Pain Loc      Pain Edu?      Excl. in [place]?     Constitutional: Alert and oriented. Well appearing and in no acute distress. Eyes: Conjunctivae are normal. PERRL. EOMI. Head: right forehead ecchymosesappeared to the right eyebrow. Mouth/Throat: Mucous membranes are moist. Oropharynx non-erythematous. Neck: No stridor.   No cervical spine tenderness to palpation. Cardiovascular: Normal rate, regular rhythm. Good peripheral circulation. Grossly normal heart sounds. Respiratory: Normal respiratory effort.  No retractions. Lungs CTAB. Gastrointestinal: Soft and nontender. No distention.   Musculoskeletal: No lower extremity tenderness nor edema. No gross deformities of extremities. Neurologic:  Normal speech and language. No gross focal neurologic deficits are appreciated.  Skin:  Skin is warm, dry and intact. No rash noted. Psychiatric: Mood and affect are normal. Speech and behavior are normal.  ___________________________________ RADIOLOGY I, Pat[place] N BROWN, personally viewed and evaluated these images (plain radiographs) as part of my medical decision making, as well as reviewing the written report by the radiologist.  ED MD interpretation:  CT head revealed no acute findings. CT cervical spine revealed degenerative joint changes as well as incidental finding of a 1.9 cm thyroid nodule that is increased in size since last image.  Official radiology report(s): Ct Head Wo Contrast  Result Date: 02/05/2018 CLINICAL DATA:  Fall with head injury. EXAM: CT HEAD WITHOUT CONTRAST CT CERVICAL SPINE WITHOUT CONTRAST TECHNIQUE: Multidetector CT imaging of the head and cervical spine was performed following the standard protocol without intravenous contrast. Multiplanar CT image reconstructions of the cervical spine were also generated. COMPARISON:  11/30/2012 FINDINGS: CT HEAD FINDINGS Brain: No mass effect, midline shift, or acute hemorrhage. Mild atrophy  appropriate to age. Minimal chronic ischemic changes in the periventricular white matter. Vascular: No hyperdense vessel or unexpected calcification. Skull: Intact Sinuses/Orbits: No acute finding. Other: Minimal soft tissue swelling over the right frontal bone. CT CERVICAL SPINE FINDINGS Alignment: Cervical lordosis is reversed Skull base and vertebrae: No acute fracture. No dislocation. No destructive bone lesion. Soft tissues and spinal canal: No obvious spinal hematoma. No prevertebral edema. 1.9 cm right thyroid hypodensity previously measured 1.0 cm. Atherosclerotic calcification of the carotid arteries bilaterally. Disc levels: Advanced degenerative disc disease is seen throughout the cervical spine with posterior osteophytic ridging and uncovertebral osteophytes. This results in an element of spinal stenosis at C3-4 and C4-5. Right foraminal narrowing occurs at C3-4, C4-5, and C5-6. Upper chest: Negative. Other: C7 cervical ribs bilaterally. IMPRESSION: No acute intracranial pathology. No evidence of cervical spine injury. 1.9 cm right thyroid hypodensity is larger. Thyroid ultrasound is recommended. Degenerative changes in the cervical spine. Electronically Signed   By: Marybelle Killings M.D.   On:  02/05/2018 22:12   Ct Cervical Spine Wo Contrast  Result Date: 02/05/2018 CLINICAL DATA:  Fall with head injury. EXAM: CT HEAD WITHOUT CONTRAST CT CERVICAL SPINE WITHOUT CONTRAST TECHNIQUE: Multidetector CT imaging of the head and cervical spine was performed following the standard protocol without intravenous contrast. Multiplanar CT image reconstructions of the cervical spine were also generated. COMPARISON:  11/30/2012 FINDINGS: CT HEAD FINDINGS Brain: No mass effect, midline shift, or acute hemorrhage. Mild atrophy appropriate to age. Minimal chronic ischemic changes in the periventricular white matter. Vascular: No hyperdense vessel or unexpected calcification. Skull: Intact Sinuses/Orbits: No acute finding.  Other: Minimal soft tissue swelling over the right frontal bone. CT CERVICAL SPINE FINDINGS Alignment: Cervical lordosis is reversed Skull base and vertebrae: No acute fracture. No dislocation. No destructive bone lesion. Soft tissues and spinal canal: No obvious spinal hematoma. No prevertebral edema. 1.9 cm right thyroid hypodensity previously measured 1.0 cm. Atherosclerotic calcification of the carotid arteries bilaterally. Disc levels: Advanced degenerative disc disease is seen throughout the cervical spine with posterior osteophytic ridging and uncovertebral osteophytes. This results in an element of spinal stenosis at C3-4 and C4-5. Right foraminal narrowing occurs at C3-4, C4-5, and C5-6. Upper chest: Negative. Other: C7 cervical ribs bilaterally. IMPRESSION: No acute intracranial pathology. No evidence of cervical spine injury. 1.9 cm right thyroid hypodensity is larger. Thyroid ultrasound is recommended. Degenerative changes in the cervical spine. Electronically Signed   By: Marybelle Killings M.D.   On: 02/05/2018 22:12    ___________________________________ Procedures   ____________________________________________   INITIAL IMPRESSION / ASSESSMENT AND PLAN / ED COURSE  As part of my medical decision making, I reviewed the following data within the [place]   a year-old female presenting with above stated history of physical exam following accidental fall with resultant forehead injury. Concern for possible intercranial injury versus cervical spine as such CT head was performed as well as cervical spine is revealed no acute findings. Patient was notified of thyroid nodule and advised to follow-up with Dr. Rebecka Apley primary care provider for further outpatient evaluation ____________________________________________  FINAL CLINICAL IMPRESSION(S) / ED DIAGNOSES  Final diagnoses:  Injury of head, initial encounter  Forehead contusion, initial encounter  Thyroid nodule      MEDICATIONS GIVEN DURING THIS VISIT:  Medications  acetaminophen (TYLENOL) 325 MG tablet (650 mg  Given 02/05/18 2326)     ED Discharge Orders    None       Note:  This document was prepared using Dragon voice recognition software and may include unintentional dictation errors.    Gregor Hams, MD 02/05/18 2337

## 2018-02-27 DIAGNOSIS — I739 Peripheral vascular disease, unspecified: Secondary | ICD-10-CM | POA: Diagnosis not present

## 2018-02-27 DIAGNOSIS — E8881 Metabolic syndrome: Secondary | ICD-10-CM | POA: Diagnosis not present

## 2018-02-27 DIAGNOSIS — I10 Essential (primary) hypertension: Secondary | ICD-10-CM | POA: Diagnosis not present

## 2018-02-27 DIAGNOSIS — E119 Type 2 diabetes mellitus without complications: Secondary | ICD-10-CM | POA: Diagnosis not present

## 2018-03-06 DIAGNOSIS — E119 Type 2 diabetes mellitus without complications: Secondary | ICD-10-CM | POA: Diagnosis not present

## 2018-03-11 DIAGNOSIS — R110 Nausea: Secondary | ICD-10-CM | POA: Diagnosis not present

## 2018-03-11 DIAGNOSIS — E119 Type 2 diabetes mellitus without complications: Secondary | ICD-10-CM | POA: Diagnosis not present

## 2018-03-11 DIAGNOSIS — R5381 Other malaise: Secondary | ICD-10-CM | POA: Diagnosis not present

## 2018-03-11 DIAGNOSIS — I10 Essential (primary) hypertension: Secondary | ICD-10-CM | POA: Diagnosis not present

## 2018-03-11 DIAGNOSIS — E7849 Other hyperlipidemia: Secondary | ICD-10-CM | POA: Diagnosis not present

## 2018-03-27 ENCOUNTER — Other Ambulatory Visit: Payer: Self-pay | Admitting: Internal Medicine

## 2018-03-27 DIAGNOSIS — E119 Type 2 diabetes mellitus without complications: Secondary | ICD-10-CM | POA: Diagnosis not present

## 2018-03-27 DIAGNOSIS — Z0000 Encounter for general adult medical examination without abnormal findings: Secondary | ICD-10-CM | POA: Diagnosis not present

## 2018-03-27 DIAGNOSIS — E8881 Metabolic syndrome: Secondary | ICD-10-CM | POA: Diagnosis not present

## 2018-03-27 DIAGNOSIS — I739 Peripheral vascular disease, unspecified: Secondary | ICD-10-CM | POA: Diagnosis not present

## 2018-03-27 DIAGNOSIS — I10 Essential (primary) hypertension: Secondary | ICD-10-CM | POA: Diagnosis not present

## 2018-03-27 DIAGNOSIS — M8580 Other specified disorders of bone density and structure, unspecified site: Secondary | ICD-10-CM

## 2018-04-22 DIAGNOSIS — Z23 Encounter for immunization: Secondary | ICD-10-CM | POA: Diagnosis not present

## 2018-04-22 DIAGNOSIS — E669 Obesity, unspecified: Secondary | ICD-10-CM | POA: Diagnosis not present

## 2018-04-22 DIAGNOSIS — I10 Essential (primary) hypertension: Secondary | ICD-10-CM | POA: Diagnosis not present

## 2018-04-22 DIAGNOSIS — I498 Other specified cardiac arrhythmias: Secondary | ICD-10-CM | POA: Diagnosis not present

## 2018-04-22 DIAGNOSIS — E119 Type 2 diabetes mellitus without complications: Secondary | ICD-10-CM | POA: Diagnosis not present

## 2018-05-06 ENCOUNTER — Ambulatory Visit
Admission: RE | Admit: 2018-05-06 | Discharge: 2018-05-06 | Disposition: A | Discharge status: Home / Self Care | Payer: Medicare HMO | Source: Ambulatory Visit | Attending: Internal Medicine | Admitting: Internal Medicine

## 2018-05-06 DIAGNOSIS — M8580 Other specified disorders of bone density and structure, unspecified site: Secondary | ICD-10-CM

## 2018-05-06 DIAGNOSIS — M85811 Other specified disorders of bone density and structure, right shoulder: Secondary | ICD-10-CM | POA: Insufficient documentation

## 2018-05-06 DIAGNOSIS — Z780 Asymptomatic menopausal state: Secondary | ICD-10-CM | POA: Diagnosis not present

## 2018-05-06 DIAGNOSIS — Z13820 Encounter for screening for osteoporosis: Secondary | ICD-10-CM | POA: Diagnosis not present

## 2018-06-16 DIAGNOSIS — I10 Essential (primary) hypertension: Secondary | ICD-10-CM | POA: Diagnosis not present

## 2018-06-16 DIAGNOSIS — E119 Type 2 diabetes mellitus without complications: Secondary | ICD-10-CM | POA: Diagnosis not present

## 2018-06-16 DIAGNOSIS — E669 Obesity, unspecified: Secondary | ICD-10-CM | POA: Diagnosis not present

## 2018-06-16 DIAGNOSIS — I498 Other specified cardiac arrhythmias: Secondary | ICD-10-CM | POA: Diagnosis not present

## 2018-06-19 DIAGNOSIS — E119 Type 2 diabetes mellitus without complications: Secondary | ICD-10-CM | POA: Diagnosis not present

## 2018-07-17 DIAGNOSIS — I498 Other specified cardiac arrhythmias: Secondary | ICD-10-CM | POA: Diagnosis not present

## 2018-07-17 DIAGNOSIS — E119 Type 2 diabetes mellitus without complications: Secondary | ICD-10-CM | POA: Diagnosis not present

## 2018-07-26 ENCOUNTER — Emergency Department
Admission: EM | Admit: 2018-07-26 | Discharge: 2018-07-26 | Disposition: A | Discharge status: Home / Self Care | Payer: Medicare Other | Attending: Emergency Medicine | Admitting: Emergency Medicine

## 2018-07-26 ENCOUNTER — Emergency Department: Payer: Medicare Other

## 2018-07-26 DIAGNOSIS — Z87891 Personal history of nicotine dependence: Secondary | ICD-10-CM | POA: Insufficient documentation

## 2018-07-26 DIAGNOSIS — R05 Cough: Secondary | ICD-10-CM | POA: Diagnosis not present

## 2018-07-26 DIAGNOSIS — Z79899 Other long term (current) drug therapy: Secondary | ICD-10-CM | POA: Insufficient documentation

## 2018-07-26 DIAGNOSIS — Z7984 Long term (current) use of oral hypoglycemic drugs: Secondary | ICD-10-CM | POA: Insufficient documentation

## 2018-07-26 DIAGNOSIS — I10 Essential (primary) hypertension: Secondary | ICD-10-CM | POA: Diagnosis not present

## 2018-07-26 DIAGNOSIS — E119 Type 2 diabetes mellitus without complications: Secondary | ICD-10-CM | POA: Diagnosis not present

## 2018-07-26 DIAGNOSIS — J40 Bronchitis, not specified as acute or chronic: Secondary | ICD-10-CM | POA: Insufficient documentation

## 2018-07-26 MED ORDER — BENZONATATE 100 MG PO CAPS: 100.0000 mg | ORAL_CAPSULE | Freq: Four times a day (QID) | ORAL | 0 refills | Status: AC | PRN

## 2018-07-26 MED ORDER — PREDNISONE 50 MG PO TABS: 50.0000 mg | ORAL_TABLET | Freq: Every day | ORAL | 0 refills | Status: DC

## 2018-07-26 MED ORDER — AZITHROMYCIN 250 MG PO TABS: ORAL_TABLET | ORAL | 0 refills | Status: DC

## 2018-07-26 MED ORDER — ALBUTEROL SULFATE HFA 108 (90 BASE) MCG/ACT IN AERS: 2.0000 | INHALATION_SPRAY | RESPIRATORY_TRACT | 0 refills | Status: DC | PRN

## 2018-07-26 MED ORDER — PREDNISONE 20 MG PO TABS
60.0000 mg | ORAL_TABLET | Freq: Once | ORAL | Status: AC
Start: 2018-07-26 — End: 2018-07-26
  Administered 2018-07-26: 60 mg via ORAL
  Filled 2018-07-26: qty 3

## 2018-07-26 MED ORDER — AZITHROMYCIN 500 MG PO TABS
500.0000 mg | ORAL_TABLET | Freq: Once | ORAL | Status: AC
  Administered 2018-07-26: 500 mg via ORAL
  Filled 2018-07-26: qty 1

## 2018-07-26 NOTE — ED Triage Notes (Signed)
Pt presents via POV c/o cough x2 weeks. Reports worsening. Denies fever.

## 2018-07-26 NOTE — ED Provider Notes (Signed)
[facility] Emergency Department Provider Note  ____________________________________________  Time seen: Approximately 4:36 PM  I have reviewed the triage vital signs and the nursing notes.   HISTORY  Chief Complaint Cough    HPI Vanessa Vazquez is a 84 y.o. female who presents the emergency department for 2-week history of coughing.  Patient reports that initially she had viral URI symptoms with nasal congestion, sore throat, cough.  Patient did have low-grade fevers at that time as well.  Majority of symptoms improved with the exception of a cough.  Patient reports that the cough is becoming more frequent and worse over the past couple of days.  She denies any fevers at this time.  No shortness of breath, chest pain, domino pain, nausea or vomiting.  Patient does have a history of asthma but states that she has not had any asthma symptoms in a "long time."  Patient used to be on Advair daily but has not taken that for several years.  Patient does have a history of asthma, diabetes, GERD, hypercholesterolemia, hypertension.  No complaints of chronic medical problems.    Past Medical History:  Diagnosis Date  . Asthma   . Diabetes mellitus without complication ([place])   . GERD (gastroesophageal reflux disease)   . Hypercholesteremia   . Hypertension     Patient Active Problem List   Diagnosis Date Noted  . GI bleed 03/17/2016  . Vaginal odor 12/14/2015  . Vulvar itching 12/14/2015  . Vaginal leukorrhea 12/14/2015  . Hypertension   . Hypercholesteremia   . Asthma     Past Surgical History:  Procedure Laterality Date  . CESAREAN SECTION    . COLONOSCOPY WITH PROPOFOL N/A 03/19/2016   Procedure: COLONOSCOPY WITH PROPOFOL;  Surgeon: Manya Silvas, MD;  Location: [facility] ENDOSCOPY;  Service: Endoscopy;  Laterality: N/A;    Prior to Admission medications   Medication Sig Start Date End Date Taking? Authorizing Provider  albuterol (PROVENTIL HFA;VENTOLIN  HFA) 108 (90 Base) MCG/ACT inhaler Inhale 2 puffs into the lungs every 4 (four) hours as needed for wheezing or shortness of breath. 07/26/18   Cuthriell, Charline Bills, PA-C  atenolol (TENORMIN) 25 MG tablet Take 25 mg by mouth daily.     [provider]  atorvastatin (LIPITOR) 10 MG tablet Take 10 mg by mouth daily.     [provider]  azithromycin (ZITHROMAX Z-PAK) 250 MG tablet Take 2 tablets (500 mg) on  Day 1,  followed by 1 tablet (250 mg) once daily on Days 2 through 5. 07/26/18   Cuthriell, Charline Bills, PA-C  benzonatate (TESSALON PERLES) 100 MG capsule Take 1 capsule (100 mg total) by mouth every 6 (six) hours as needed for cough. 07/26/18 07/26/19  Cuthriell, Charline Bills, PA-C  Cinnamon 500 MG capsule Take 1,000 mg by mouth daily.     [provider]  furosemide (LASIX) 20 MG tablet Take 1 tablet (20 mg total) by mouth daily as needed for edema. 03/20/16   Gladstone Lighter, MD  glimepiride (AMARYL) 2 MG tablet Take 2 mg by mouth 2 (two) times daily.     [provider]  metFORMIN (GLUCOPHAGE) 1000 MG tablet Take 1,000 mg by mouth 2 (two) times daily.     [provider]  Multiple Vitamin (MULTIVITAMIN) tablet Take 1 tablet by mouth daily.    [provider]  NONFORMULARY OR COMPOUNDED ITEM [facility]  Peripheral Neuropathy Cream- Bupivacaine 1%, Doxepin 3%, Gabapentin 6%, Pentoxifylline 3%, Topiramate 1% Apply  1-2 grams to affected area 3-4 times daily Qty. 120 gm 3 refills 10/26/16   Edrick Kins, DPM  Omega-3 Fatty Acids (FISH OIL) 1000 MG CAPS Take 1,000 mg by mouth daily.     [provider]  predniSONE (DELTASONE) 50 MG tablet Take 1 tablet (50 mg total) by mouth daily with breakfast. 07/26/18   Cuthriell, Charline Bills, PA-C  traMADol (ULTRAM) 50 MG tablet 1 tablet every 6-8 hours as needed for pain. 11/22/16   Johnn Hai, PA-C    Allergies Patient has no known allergies.  Family History  Problem Relation Age  of Onset  . Lung cancer Father   . Leukemia Sister   . Diabetes Sister   . Asthma Child     Social History Social History   Tobacco Use  . Smoking status: Former Research scientist (life sciences)  . Smokeless tobacco: Never Used  Substance Use Topics  . Alcohol use: No    Alcohol/week: 0.0 standard drinks  . Drug use: No     Review of Systems  Constitutional: No fever/chills Eyes: No visual changes. No discharge ENT: No upper respiratory complaints. Cardiovascular: no chest pain. Respiratory: Positive for 2-week history of worsening cough. No SOB. Gastrointestinal: No abdominal pain.  No nausea, no vomiting.  No diarrhea.  No constipation. Musculoskeletal: Negative for musculoskeletal pain. Skin: Negative for rash, abrasions, lacerations, ecchymosis. Neurological: Negative for headaches, focal weakness or numbness. 10-point ROS otherwise negative.  ____________________________________________   PHYSICAL EXAM:  VITAL SIGNS: ED Triage Vitals  Enc Vitals Group     BP 07/26/18 1455 (!) 134/95     Pulse Rate 07/26/18 1455 65     Resp 07/26/18 1455 16     Temp 07/26/18 1455 98.5 F (36.9 C)     Temp Source 07/26/18 1455 Oral     SpO2 07/26/18 1455 95 %     Weight 07/26/18 1456 233 lb (105.7 kg)     Height --      Head Circumference --      Peak Flow --      Pain Score 07/26/18 1456 0     Pain Loc --      Pain Edu? --      Excl. in [place]? --      Constitutional: Alert and oriented. Well appearing and in no acute distress. Eyes: Conjunctivae are normal. PERRL. EOMI. Head: Atraumatic. ENT:      Ears: EACs and TMs unremarkable bilaterally      Nose: No congestion/rhinnorhea.      Mouth/Throat: Mucous membranes are moist.  Neck: No stridor.   Hematological/Lymphatic/Immunilogical: No cervical lymphadenopathy Cardiovascular: Normal rate, regular rhythm. Normal S1 and S2.  Good peripheral circulation. Respiratory: Normal respiratory effort without tachypnea or retractions. Lungs with a few  expiratory wheezes and crackles in bilateral lower lung fields.  No rales or rhonchi.Kermit Balo air entry to the bases with no decreased or absent breath sounds. Musculoskeletal: Full range of motion to all extremities. No gross deformities appreciated. Neurologic:  Normal speech and language. No gross focal neurologic deficits are appreciated.  Skin:  Skin is warm, dry and intact. No rash noted. Psychiatric: Mood and affect are normal. Speech and behavior are normal. Patient exhibits appropriate insight and judgement.   ____________________________________________   LABS (all labs ordered are listed, but only abnormal results are displayed)  Labs Reviewed - No data to display ____________________________________________  EKG   ____________________________________________  RADIOLOGY I personally viewed and evaluated these images as part of my  medical decision making, as well as reviewing the written report by the radiologist.  Dg Chest 2 View  Result Date: 07/26/2018 CLINICAL DATA:  Cough for 2 weeks. EXAM: CHEST - 2 VIEW COMPARISON:  11/10/2015 chest radiograph FINDINGS: The cardiomediastinal silhouette is unremarkable. There is no evidence of focal airspace disease, pulmonary edema, suspicious pulmonary nodule/mass, pleural effusion, or pneumothorax. No acute bony abnormalities are identified. IMPRESSION: No active cardiopulmonary disease. Electronically Signed   By: Margarette Canada M.D.   On: 07/26/2018 16:11    ____________________________________________    PROCEDURES  Procedure(s) performed:    Procedures    Medications  predniSONE (DELTASONE) tablet 60 mg (has no administration in time range)  azithromycin (ZITHROMAX) tablet 500 mg (has no administration in time range)     ____________________________________________   INITIAL IMPRESSION / ASSESSMENT AND PLAN / ED COURSE  Pertinent labs & imaging results that were available during my care of the patient were  reviewed by me and considered in my medical decision making (see chart for details).  Review of the [place] CSRS was performed in accordance of the [place] prior to dispensing any controlled drugs.      Patient's diagnosis is consistent with bronchitis.  Patient presents emergency department with worsening cough for the past 2 weeks.  Patient started with viral URI symptoms, which improved with the exception of cough which has been ongoing and now worsening.  Chest x-ray reveals no consolidation.  Exam was overall reassuring.  Given duration of symptoms, I will treat the patient with antibiotics in addition to prednisone and albuterol.  Patient is given Ladona Ridgel for symptom relief.  Tylenol Motrin at home if she should develop a fever.  Otherwise, follow-up with primary care as needed.. Patient is given ED precautions to return to the ED for any worsening or new symptoms.     ____________________________________________  FINAL CLINICAL IMPRESSION(S) / ED DIAGNOSES  Final diagnoses:  Bronchitis      NEW MEDICATIONS STARTED DURING THIS VISIT:  ED Discharge Orders         Ordered    azithromycin (ZITHROMAX Z-PAK) 250 MG tablet     07/26/18 1651    predniSONE (DELTASONE) 50 MG tablet  Daily with breakfast     07/26/18 1651    benzonatate (TESSALON PERLES) 100 MG capsule  Every 6 hours PRN     07/26/18 1651    albuterol (PROVENTIL HFA;VENTOLIN HFA) 108 (90 Base) MCG/ACT inhaler  Every 4 hours PRN     07/26/18 1651              This chart was dictated using voice recognition software/Dragon. Despite best efforts to proofread, errors can occur which can change the meaning. Any change was purely unintentional.    Darletta Moll, PA-C 07/26/18 1653    Harvest Dark, MD 07/26/18 2026

## 2018-09-06 ENCOUNTER — Emergency Department
Admission: EM | Admit: 2018-09-06 | Discharge: 2018-09-06 | Disposition: A | Discharge status: Home / Self Care | Payer: Medicare Other | Attending: Emergency Medicine | Admitting: Emergency Medicine

## 2018-09-06 ENCOUNTER — Other Ambulatory Visit: Payer: Self-pay

## 2018-09-06 ENCOUNTER — Emergency Department: Payer: Medicare Other

## 2018-09-06 DIAGNOSIS — N390 Urinary tract infection, site not specified: Secondary | ICD-10-CM | POA: Diagnosis not present

## 2018-09-06 DIAGNOSIS — Z87891 Personal history of nicotine dependence: Secondary | ICD-10-CM | POA: Insufficient documentation

## 2018-09-06 DIAGNOSIS — J45909 Unspecified asthma, uncomplicated: Secondary | ICD-10-CM | POA: Insufficient documentation

## 2018-09-06 DIAGNOSIS — R739 Hyperglycemia, unspecified: Secondary | ICD-10-CM

## 2018-09-06 DIAGNOSIS — R42 Dizziness and giddiness: Secondary | ICD-10-CM

## 2018-09-06 DIAGNOSIS — I10 Essential (primary) hypertension: Secondary | ICD-10-CM | POA: Diagnosis not present

## 2018-09-06 DIAGNOSIS — E1165 Type 2 diabetes mellitus with hyperglycemia: Secondary | ICD-10-CM | POA: Insufficient documentation

## 2018-09-06 DIAGNOSIS — Z79899 Other long term (current) drug therapy: Secondary | ICD-10-CM | POA: Diagnosis not present

## 2018-09-06 DIAGNOSIS — R531 Weakness: Secondary | ICD-10-CM | POA: Diagnosis not present

## 2018-09-06 DIAGNOSIS — R0902 Hypoxemia: Secondary | ICD-10-CM | POA: Diagnosis not present

## 2018-09-06 LAB — CBC WITH DIFFERENTIAL/PLATELET
Abs Immature Granulocytes: 0.01 10*3/uL (ref 0.00–0.07)
Basophils Absolute: 0 10*3/uL (ref 0.0–0.1)
Basophils Relative: 1 %
Eosinophils Absolute: 0 10*3/uL (ref 0.0–0.5)
Eosinophils Relative: 0 %
HCT: 39 % (ref 36.0–46.0)
Hemoglobin: 12.4 g/dL (ref 12.0–15.0)
Immature Granulocytes: 0 %
Lymphocytes Relative: 28 %
Lymphs Abs: 1.6 10*3/uL (ref 0.7–4.0)
MCH: 30.9 pg (ref 26.0–34.0)
MCHC: 31.8 g/dL (ref 30.0–36.0)
MCV: 97.3 fL (ref 80.0–100.0)
Monocytes Absolute: 0.4 10*3/uL (ref 0.1–1.0)
Monocytes Relative: 6 %
Neutro Abs: 3.6 10*3/uL (ref 1.7–7.7)
Neutrophils Relative %: 65 %
Platelets: 222 10*3/uL (ref 150–400)
RBC: 4.01 MIL/uL (ref 3.87–5.11)
RDW: 12.6 % (ref 11.5–15.5)
WBC: 5.7 10*3/uL (ref 4.0–10.5)
nRBC: 0 % (ref 0.0–0.2)

## 2018-09-06 LAB — URINALYSIS, COMPLETE (UACMP) WITH MICROSCOPIC
Bacteria, UA: NONE SEEN
Bilirubin Urine: NEGATIVE
Glucose, UA: >=500 mg/dL — AB
Hgb urine dipstick: NEGATIVE
Ketones, ur: NEGATIVE mg/dL
Nitrite: NEGATIVE
Protein, ur: NEGATIVE mg/dL
Specific Gravity, Urine: 1.012 (ref 1.005–1.030)
pH: 5 (ref 5.0–8.0)

## 2018-09-06 LAB — COMPREHENSIVE METABOLIC PANEL
ALT: 14 U/L (ref 0–44)
AST: 19 U/L (ref 15–41)
Albumin: 3.5 g/dL (ref 3.5–5.0)
Alkaline Phosphatase: 88 U/L (ref 38–126)
Anion gap: 9 (ref 5–15)
BUN: 14 mg/dL (ref 8–23)
CO2: 26 mmol/L (ref 22–32)
Calcium: 9.5 mg/dL (ref 8.9–10.3)
Chloride: 104 mmol/L (ref 98–111)
Creatinine, Ser: 0.85 mg/dL (ref 0.44–1.00)
GFR calc Af Amer: >60 mL/min (ref >60)
GFR calc non Af Amer: >60 mL/min (ref >60)
Glucose, Bld: 281 mg/dL — ABNORMAL HIGH (ref 70–99)
Potassium: 3.8 mmol/L (ref 3.5–5.1)
Sodium: 139 mmol/L (ref 135–145)
Total Bilirubin: 0.6 mg/dL (ref 0.3–1.2)
Total Protein: 6.8 g/dL (ref 6.5–8.1)

## 2018-09-06 LAB — ETHANOL: Alcohol, Ethyl (B): <10 mg/dL (ref <10)

## 2018-09-06 LAB — TROPONIN I: Troponin I: <0.03 ng/mL (ref <0.03)

## 2018-09-06 LAB — GLUCOSE, CAPILLARY
Glucose-Capillary: 183 mg/dL — ABNORMAL HIGH (ref 70–99)
Glucose-Capillary: 194 mg/dL — ABNORMAL HIGH (ref 70–99)

## 2018-09-06 MED ORDER — FOSFOMYCIN TROMETHAMINE 3 G PO PACK: 3.0000 g | PACK | Freq: Once | ORAL | 0 refills | Status: DC

## 2018-09-06 MED ORDER — FOSFOMYCIN TROMETHAMINE 3 G PO PACK: 3.0000 g | PACK | Freq: Once | ORAL | 0 refills | Status: AC

## 2018-09-06 MED ORDER — FOSFOMYCIN TROMETHAMINE 3 G PO PACK
3.0000 g | PACK | Freq: Once | ORAL | Status: AC
  Administered 2018-09-06: 3 g via ORAL
  Filled 2018-09-06: qty 3

## 2018-09-06 MED ORDER — SODIUM CHLORIDE 0.9 % IV BOLUS
500.0000 mL | Freq: Once | INTRAVENOUS | Status: AC
  Administered 2018-09-06: 500 mL via INTRAVENOUS

## 2018-09-06 NOTE — ED Provider Notes (Addendum)
[facility] Emergency Department Provider Note  ____________________________________________   I have reviewed the triage vital signs and the nursing notes. Where available I have reviewed prior notes and, if possible and indicated, outside hospital notes.    HISTORY  Chief Complaint Dizziness    HPI Vanessa Vazquez is a 84 y.o. female  With a history of asthma and diabetes mellitus which is usually controlled by oral medications, states that she has been feeling rather weak over the last 3 days.  Very nonspecific complaint.  Does not have any focal complaints.  Review of systems is otherwise negative.  BGM was elevated per EMS.  Patient states she is taking her medications including her Lasix.  She feels a little bit dehydrated.  Review of systems.  Patient specifically denies any focal numbness or weakness.  She is been feeling a little bit lightheaded for 3 days.  She denies dysuria urinary frequency chest pain shortness of breath diarrhea vomiting nausea melena bright red blood per rectum hematemesis she denies any focal numbness or weakness she denies any headache, she denies any fall or trauma she denies any pain of any variety, she denies any difficulty speaking or understanding speech. She denies leg swelling, she just states that she feels generally somewhat weaker than normal.   Past Medical History:  Diagnosis Date  . Asthma   . Diabetes mellitus without complication ([place])   . GERD (gastroesophageal reflux disease)   . Hypercholesteremia   . Hypertension     Patient Active Problem List   Diagnosis Date Noted  . GI bleed 03/17/2016  . Vaginal odor 12/14/2015  . Vulvar itching 12/14/2015  . Vaginal leukorrhea 12/14/2015  . Hypertension   . Hypercholesteremia   . Asthma     Past Surgical History:  Procedure Laterality Date  . CESAREAN SECTION    . COLONOSCOPY WITH PROPOFOL N/A 03/19/2016   Procedure: COLONOSCOPY WITH PROPOFOL;  Surgeon: Manya Silvas, MD;  Location: [facility] ENDOSCOPY;  Service: Endoscopy;  Laterality: N/A;    Prior to Admission medications   Medication Sig Start Date End Date Taking? Authorizing Provider  albuterol (PROVENTIL HFA;VENTOLIN HFA) 108 (90 Base) MCG/ACT inhaler Inhale 2 puffs into the lungs every 4 (four) hours as needed for wheezing or shortness of breath. 07/26/18   Cuthriell, Charline Bills, PA-C  atenolol (TENORMIN) 25 MG tablet Take 25 mg by mouth daily.     [provider]  atorvastatin (LIPITOR) 10 MG tablet Take 10 mg by mouth daily.     [provider]  azithromycin (ZITHROMAX Z-PAK) 250 MG tablet Take 2 tablets (500 mg) on  Day 1,  followed by 1 tablet (250 mg) once daily on Days 2 through 5. 07/26/18   Cuthriell, Charline Bills, PA-C  benzonatate (TESSALON PERLES) 100 MG capsule Take 1 capsule (100 mg total) by mouth every 6 (six) hours as needed for cough. 07/26/18 07/26/19  Cuthriell, Charline Bills, PA-C  Cinnamon 500 MG capsule Take 1,000 mg by mouth daily.     [provider]  furosemide (LASIX) 20 MG tablet Take 1 tablet (20 mg total) by mouth daily as needed for edema. 03/20/16   Gladstone Lighter, MD  glimepiride (AMARYL) 2 MG tablet Take 2 mg by mouth 2 (two) times daily.     [provider]  metFORMIN (GLUCOPHAGE) 1000 MG tablet Take 1,000 mg by mouth 2 (two) times daily.     [provider]  Multiple Vitamin (MULTIVITAMIN) tablet Take 1 tablet  by mouth daily.    [provider]  NONFORMULARY OR COMPOUNDED ITEM [facility]  Peripheral Neuropathy Cream- Bupivacaine 1%, Doxepin 3%, Gabapentin 6%, Pentoxifylline 3%, Topiramate 1% Apply 1-2 grams to affected area 3-4 times daily Qty. 120 gm 3 refills 10/26/16   Edrick Kins, DPM  Omega-3 Fatty Acids (FISH OIL) 1000 MG CAPS Take 1,000 mg by mouth daily.     [provider]  predniSONE (DELTASONE) 50 MG tablet Take 1 tablet (50 mg total) by mouth daily with breakfast. 07/26/18    Cuthriell, Charline Bills, PA-C  traMADol (ULTRAM) 50 MG tablet 1 tablet every 6-8 hours as needed for pain. 11/22/16   Johnn Hai, PA-C    Allergies Patient has no known allergies.  Family History  Problem Relation Age of Onset  . Lung cancer Father   . Leukemia Sister   . Diabetes Sister   . Asthma Child     Social History Social History   Tobacco Use  . Smoking status: Former Research scientist (life sciences)  . Smokeless tobacco: Never Used  Substance Use Topics  . Alcohol use: No    Alcohol/week: 0.0 standard drinks  . Drug use: No    Review of Systems Constitutional: No fever/chills Eyes: No visual changes. ENT: No sore throat. No stiff neck no neck pain Cardiovascular: Denies chest pain. Respiratory: Denies shortness of breath. Gastrointestinal:   no vomiting.  No diarrhea.  No constipation. Genitourinary: Negative for dysuria. Musculoskeletal: Negative lower extremity swelling Skin: Negative for rash. Neurological: Negative for severe headaches, focal weakness or numbness.   ____________________________________________   PHYSICAL EXAM:  VITAL SIGNS: ED Triage Vitals  Enc Vitals Group     BP 09/06/18 0859 (!) 147/65     Pulse Rate 09/06/18 0859 82     Resp 09/06/18 0859 19     Temp 09/06/18 0859 97.8 F (36.6 C)     Temp Source 09/06/18 0859 Oral     SpO2 09/06/18 0859 98 %     Weight 09/06/18 0855 230 lb (104.3 kg)     Height 09/06/18 0855 5\' 2"  (1.575 m)     Head Circumference --      Peak Flow --      Pain Score 09/06/18 0854 0     Pain Loc --      Pain Edu? --      Excl. in [place]? --     Constitutional: Alert and oriented. Well appearing and in no acute distress. Eyes: Conjunctivae are normal Head: Atraumatic HEENT: No congestion/rhinnorhea. Mucous membranes are moist.  Oropharynx non-erythematous Neck:   Nontender with no meningismus, no masses, no stridor Cardiovascular: Normal rate, regular rhythm. Grossly normal heart sounds.  Good peripheral  circulation. Respiratory: Normal respiratory effort.  No retractions. Lungs CTAB. Abdominal: Soft and nontender. No distention. No guarding no rebound Back:  There is no focal tenderness or step off.  there is no midline tenderness there are no lesions noted. there is no CVA tenderness Musculoskeletal: No lower extremity tenderness, no upper extremity tenderness. No joint effusions, no DVT signs strong distal pulses no edema Neurologic:  Normal speech and language. No gross focal neurologic deficits are appreciated.  Skin:  Skin is warm, dry and intact. No rash noted. Psychiatric: Mood and affect are normal. Speech and behavior are normal.  ____________________________________________   LABS (all labs ordered are listed, but only abnormal results are displayed)  Labs Reviewed  CBC WITH DIFFERENTIAL/PLATELET  URINALYSIS, COMPLETE (UACMP) WITH MICROSCOPIC  ETHANOL  COMPREHENSIVE METABOLIC PANEL  TROPONIN I    Pertinent labs  results that were available during my care of the patient were reviewed by me and considered in my medical decision making (see chart for details). ____________________________________________  EKG  I personally interpreted any EKGs ordered by me or triage Bundle branch block, this rate 72 while there is no old EKG available for direct visual comparison, old EKGs were documented as having left bundle branch block at the outside hospital. ____________________________________________  RADIOLOGY  Pertinent labs & imaging results that were available during my care of the patient were reviewed by me and considered in my medical decision making (see chart for details). If possible, patient and/or family made aware of any abnormal findings.  No results found. ____________________________________________    PROCEDURES  Procedure(s) performed: None  Procedures  Critical Care performed: None  ____________________________________________   INITIAL IMPRESSION  / ASSESSMENT AND PLAN / ED COURSE  Pertinent labs & imaging results that were available during my care of the patient were reviewed by me and considered in my medical decision making (see chart for details).  Patient here feeling generally weak with somewhat elevated blood sugar.  Certainly could be simple dehydration.  There is no indication at this time of cardiopulmonary pathology no historical evidence of GI bleed there is no evidence of focal neurologic deficit to suggest CVA although sometimes a brainstem lesion might cause someone to feel vaguely unwell and we will obtain CT scan given her risk factors, no evidence of acute coronary syndrome no evidence of PE, no evidence of acute infectious pathology she has had no fevers.  Abdomen is benign.  We will give her some IV fluid and see if we can find the source of her weakness aside from likely dehydration with Lasix and hyperglycemia.  ----------------------------------------- 11:29 AM on 09/06/2018 -----------------------------------------  Feels much better after little bit of fluid, her prior sugars are usually in the 200+ range and I do not think she is much off of that actually.  She has possibly UTI with significant leukocytosis noted.  We will give her a dose of fosfomycin here as it may be was causing her symptoms.  The rest of her work-up is unremarkable and her strong preference would be to go home as soon as possible given the coronavirus pandemic and her concerns about being exposed in the emergency room which are not unreasonable.  We have asked her to wear a mask while here.  We will discharge her at this time.  We will recheck sugar make sure it is trending down.  No evidence of DKA.  Ends of ACS or hidden ischemia into suggest sepsis.  No evidence of acute renal injury, no evidence of pneumonia no evidence of CVA    ____________________________________________   FINAL CLINICAL IMPRESSION(S) / ED DIAGNOSES  Final diagnoses:   None      This chart was dictated using voice recognition software.  Despite best efforts to proofread,  errors can occur which can change meaning.      Schuyler Amor, MD 09/06/18 4010    Schuyler Amor, MD 09/06/18 1130

## 2018-09-06 NOTE — Discharge Instructions (Signed)
Drink plenty of fluids, please call your doctor first thing in the morning on Monday.  If you have increased headedness or you feel worse in any way including fever vomiting or other concerns return to the ER.

## 2018-09-06 NOTE — ED Triage Notes (Signed)
Patient Lives at home and has  has been feeling dizzy and weak over the last few days.  Arrived via [facility]. Vitals:  166/70 94% o2 307 bg   temp 98.4  no fall  takes furosemide daily.

## 2018-09-06 NOTE — ED Notes (Signed)
AA0X4. Vitals stable. NAD. Verbalized understanding of discharge instructions.

## 2018-09-08 LAB — URINE CULTURE: Culture: >=100000 — AB

## 2018-09-10 DIAGNOSIS — N390 Urinary tract infection, site not specified: Secondary | ICD-10-CM | POA: Diagnosis not present

## 2018-09-10 DIAGNOSIS — E1140 Type 2 diabetes mellitus with diabetic neuropathy, unspecified: Secondary | ICD-10-CM | POA: Diagnosis not present

## 2018-09-10 DIAGNOSIS — I119 Hypertensive heart disease without heart failure: Secondary | ICD-10-CM | POA: Diagnosis not present

## 2018-09-17 ENCOUNTER — Emergency Department
Admission: EM | Admit: 2018-09-17 | Discharge: 2018-09-17 | Disposition: A | Discharge status: Home / Self Care | Payer: Medicare Other | Attending: Emergency Medicine | Admitting: Emergency Medicine

## 2018-09-17 ENCOUNTER — Other Ambulatory Visit: Payer: Self-pay

## 2018-09-17 DIAGNOSIS — R3589 Other polyuria: Secondary | ICD-10-CM

## 2018-09-17 DIAGNOSIS — I10 Essential (primary) hypertension: Secondary | ICD-10-CM | POA: Insufficient documentation

## 2018-09-17 DIAGNOSIS — E119 Type 2 diabetes mellitus without complications: Secondary | ICD-10-CM | POA: Diagnosis not present

## 2018-09-17 DIAGNOSIS — R358 Other polyuria: Secondary | ICD-10-CM

## 2018-09-17 DIAGNOSIS — R350 Frequency of micturition: Secondary | ICD-10-CM | POA: Diagnosis present

## 2018-09-17 DIAGNOSIS — N3000 Acute cystitis without hematuria: Secondary | ICD-10-CM | POA: Insufficient documentation

## 2018-09-17 DIAGNOSIS — Z87891 Personal history of nicotine dependence: Secondary | ICD-10-CM | POA: Diagnosis not present

## 2018-09-17 LAB — COMPREHENSIVE METABOLIC PANEL
ALT: 18 U/L (ref 0–44)
AST: 18 U/L (ref 15–41)
Albumin: 3.6 g/dL (ref 3.5–5.0)
Alkaline Phosphatase: 73 U/L (ref 38–126)
Anion gap: 8 (ref 5–15)
BUN: 15 mg/dL (ref 8–23)
CO2: 25 mmol/L (ref 22–32)
Calcium: 9.6 mg/dL (ref 8.9–10.3)
Chloride: 108 mmol/L (ref 98–111)
Creatinine, Ser: 0.88 mg/dL (ref 0.44–1.00)
GFR calc Af Amer: >60 mL/min (ref >60)
GFR calc non Af Amer: >60 mL/min (ref >60)
Glucose, Bld: 167 mg/dL — ABNORMAL HIGH (ref 70–99)
Potassium: 3.8 mmol/L (ref 3.5–5.1)
Sodium: 141 mmol/L (ref 135–145)
Total Bilirubin: 0.7 mg/dL (ref 0.3–1.2)
Total Protein: 6.7 g/dL (ref 6.5–8.1)

## 2018-09-17 LAB — CBC WITH DIFFERENTIAL/PLATELET
Abs Immature Granulocytes: 0.01 10*3/uL (ref 0.00–0.07)
Basophils Absolute: 0 10*3/uL (ref 0.0–0.1)
Basophils Relative: 1 %
Eosinophils Absolute: 0.1 10*3/uL (ref 0.0–0.5)
Eosinophils Relative: 1 %
HCT: 41.3 % (ref 36.0–46.0)
Hemoglobin: 13.1 g/dL (ref 12.0–15.0)
Immature Granulocytes: 0 %
Lymphocytes Relative: 28 %
Lymphs Abs: 1.5 10*3/uL (ref 0.7–4.0)
MCH: 31.3 pg (ref 26.0–34.0)
MCHC: 31.7 g/dL (ref 30.0–36.0)
MCV: 98.6 fL (ref 80.0–100.0)
Monocytes Absolute: 0.4 10*3/uL (ref 0.1–1.0)
Monocytes Relative: 8 %
Neutro Abs: 3.4 10*3/uL (ref 1.7–7.7)
Neutrophils Relative %: 62 %
Platelets: 219 10*3/uL (ref 150–400)
RBC: 4.19 MIL/uL (ref 3.87–5.11)
RDW: 12.8 % (ref 11.5–15.5)
WBC: 5.5 10*3/uL (ref 4.0–10.5)
nRBC: 0 % (ref 0.0–0.2)

## 2018-09-17 LAB — URINALYSIS, COMPLETE (UACMP) WITH MICROSCOPIC
Bilirubin Urine: NEGATIVE
Glucose, UA: NEGATIVE mg/dL
Hgb urine dipstick: NEGATIVE
Ketones, ur: NEGATIVE mg/dL
Nitrite: NEGATIVE
Protein, ur: NEGATIVE mg/dL
Specific Gravity, Urine: 1.019 (ref 1.005–1.030)
pH: 5 (ref 5.0–8.0)

## 2018-09-17 MED ORDER — NITROFURANTOIN MONOHYD MACRO 100 MG PO CAPS: 100.0000 mg | ORAL_CAPSULE | Freq: Two times a day (BID) | ORAL | 0 refills | Status: AC

## 2018-09-17 NOTE — ED Triage Notes (Signed)
C/o frequency and fatigued upon wakening only X 4 days. States she was just treated for UTI and feels similar. Pt alert and oriented X4, active, cooperative, pt in NAD. RR even and unlabored, color WNL.

## 2018-09-17 NOTE — ED Provider Notes (Signed)
[name] Emergency Department Provider Note       Time seen: ----------------------------------------- 11:10 AM on 09/17/2018 -----------------------------------------   I have reviewed the triage vital signs and the nursing notes.  HISTORY   Chief Complaint Urinary Frequency    HPI Vanessa Vazquez is a 84 y.o. female with a history of asthma, diabetes, GERD, hyperlipidemia, hypertension who presents to the ED for urinary frequency with weakness upon awakening for the past 4 days.  Patient states she was treated on the 11th for UTI and this feels similarly.  She was given a one-time dose of antibiotics at that time.  She complains of polyuria but not dysuria.  She denies fevers, chills, chest pain, shortness of breath, vomiting or diarrhea.  Past Medical History:  Diagnosis Date  . Asthma   . Diabetes mellitus without complication ([place])   . GERD (gastroesophageal reflux disease)   . Hypercholesteremia   . Hypertension     Patient Active Problem List   Diagnosis Date Noted  . GI bleed 03/17/2016  . Vaginal odor 12/14/2015  . Vulvar itching 12/14/2015  . Vaginal leukorrhea 12/14/2015  . Hypertension   . Hypercholesteremia   . Asthma     Past Surgical History:  Procedure Laterality Date  . CESAREAN SECTION    . COLONOSCOPY WITH PROPOFOL N/A 03/19/2016   Procedure: COLONOSCOPY WITH PROPOFOL;  Surgeon: Manya Silvas, MD;  Location: [facility] ENDOSCOPY;  Service: Endoscopy;  Laterality: N/A;    Allergies Patient has no known allergies.  Social History Social History   Tobacco Use  . Smoking status: Former Research scientist (life sciences)  . Smokeless tobacco: Never Used  Substance Use Topics  . Alcohol use: No    Alcohol/week: 0.0 standard drinks  . Drug use: No   Review of Systems Constitutional: Negative for fever. Cardiovascular: Negative for chest pain. Respiratory: Negative for shortness of breath. Gastrointestinal: Negative for abdominal pain, vomiting  and diarrhea. Genitourinary: Positive for polyuria Musculoskeletal: Negative for back pain. Skin: Negative for rash. Neurological: Negative for headaches, focal weakness or numbness.  All systems negative/normal/unremarkable except as stated in the HPI  ____________________________________________   PHYSICAL EXAM:  VITAL SIGNS: ED Triage Vitals [09/17/18 1057]  Enc Vitals Group     BP (!) 191/72     Pulse Rate 77     Resp 18     Temp 98 F (36.7 C)     Temp Source Oral     SpO2 97 %     Weight 229 lb 4.5 oz (104 kg)     Height 5\' 2"  (1.575 m)     Head Circumference      Peak Flow      Pain Score 0     Pain Loc      Pain Edu?      Excl. in [place]?    Constitutional: Alert and oriented. Well appearing and in no distress. Eyes: Conjunctivae are normal. Normal extraocular movements. ENT      Head: Normocephalic and atraumatic.      Nose: No congestion/rhinnorhea.      Mouth/Throat: Mucous membranes are moist.      Neck: No stridor. Cardiovascular: Normal rate, regular rhythm. No murmurs, rubs, or gallops. Respiratory: Normal respiratory effort without tachypnea nor retractions. Breath sounds are clear and equal bilaterally. No wheezes/rales/rhonchi. Gastrointestinal: Soft and nontender. Normal bowel sounds Musculoskeletal: Nontender with normal range of motion in extremities. No lower extremity tenderness nor edema. Neurologic:  Normal speech and language. No gross focal  neurologic deficits are appreciated.  Skin:  Skin is warm, dry and intact. No rash noted. Psychiatric: Mood and affect are normal. Speech and behavior are normal.  ____________________________________________  ED COURSE:  As part of my medical decision making, I reviewed the following data within the [place] History obtained from family if available, nursing notes, old chart and ekg, as well as notes from prior ED visits. Patient presented for weakness and polyuria, we will assess with  labs and imaging as indicated at this time.   Procedures  SHENEA GIACOBBE was evaluated in Emergency Department on 09/17/2018 for the symptoms described in the history of present illness. She was evaluated in the context of the global COVID-19 pandemic, which necessitated consideration that the patient might be at risk for infection with the SARS-CoV-2 virus that causes COVID-19. Institutional protocols and algorithms that pertain to the evaluation of patients at risk for COVID-19 are in a state of rapid change based on information released by regulatory bodies including the CDC and federal and state organizations. These policies and algorithms were followed during the patient's care in the ED.  ____________________________________________   LABS (pertinent positives/negatives)  Labs Reviewed  URINALYSIS, COMPLETE (UACMP) WITH MICROSCOPIC - Abnormal; Notable for the following components:      Result Value   Color, Urine YELLOW (*)    APPearance HAZY (*)    Leukocytes,Ua TRACE (*)    Bacteria, UA RARE (*)    All other components within normal limits  COMPREHENSIVE METABOLIC PANEL - Abnormal; Notable for the following components:   Glucose, Bld 167 (*)    All other components within normal limits  URINE CULTURE  CBC WITH DIFFERENTIAL/PLATELET  ____________________________________________   DIFFERENTIAL DIAGNOSIS   UTI, pyelonephritis, dehydration, electrolyte abnormality, DKA, hyperglycemia  FINAL ASSESSMENT AND PLAN   Polyuria cystitis  Plan: The patient had presented for weakness and frequent urination. Patient's labs did not reveal any acute process.  Her urine last time did grow out ESBL.  She will be placed on Macrobid and will be referred for outpatient follow-up with urology.   Laurence Aly, MD    Note: This note was generated in part or whole with voice recognition software. Voice recognition is usually quite accurate but there are transcription errors that can and  very often do occur. I apologize for any typographical errors that were not detected and corrected.     Earleen Newport, MD 09/17/18 1258

## 2018-09-18 LAB — URINE CULTURE: Culture: <10000 — AB

## 2018-10-23 ENCOUNTER — Telehealth: Payer: Self-pay | Admitting: *Deleted

## 2018-10-23 NOTE — Telephone Encounter (Signed)
Patient called asking that Sharion Dove, NP return her call. [phone]

## 2018-10-24 NOTE — Telephone Encounter (Signed)
Message was regarding patient's sister - Vanessa Vazquez (DOB 09[birth year]29/33). Will place note in [name]'s chart.

## 2018-11-25 DIAGNOSIS — R82998 Other abnormal findings in urine: Secondary | ICD-10-CM | POA: Diagnosis not present

## 2018-11-25 DIAGNOSIS — N390 Urinary tract infection, site not specified: Secondary | ICD-10-CM | POA: Diagnosis not present

## 2018-11-25 DIAGNOSIS — R110 Nausea: Secondary | ICD-10-CM | POA: Diagnosis not present

## 2018-12-01 DIAGNOSIS — M1711 Unilateral primary osteoarthritis, right knee: Secondary | ICD-10-CM | POA: Diagnosis not present

## 2018-12-01 DIAGNOSIS — E119 Type 2 diabetes mellitus without complications: Secondary | ICD-10-CM | POA: Diagnosis not present

## 2018-12-01 DIAGNOSIS — I119 Hypertensive heart disease without heart failure: Secondary | ICD-10-CM | POA: Diagnosis not present

## 2018-12-01 DIAGNOSIS — E1140 Type 2 diabetes mellitus with diabetic neuropathy, unspecified: Secondary | ICD-10-CM | POA: Diagnosis not present

## 2018-12-02 DIAGNOSIS — E1149 Type 2 diabetes mellitus with other diabetic neurological complication: Secondary | ICD-10-CM | POA: Diagnosis not present

## 2018-12-02 DIAGNOSIS — E1140 Type 2 diabetes mellitus with diabetic neuropathy, unspecified: Secondary | ICD-10-CM | POA: Diagnosis not present

## 2018-12-02 DIAGNOSIS — I498 Other specified cardiac arrhythmias: Secondary | ICD-10-CM | POA: Diagnosis not present

## 2018-12-02 DIAGNOSIS — R079 Chest pain, unspecified: Secondary | ICD-10-CM | POA: Diagnosis not present

## 2018-12-08 DIAGNOSIS — I119 Hypertensive heart disease without heart failure: Secondary | ICD-10-CM | POA: Diagnosis not present

## 2018-12-08 DIAGNOSIS — R42 Dizziness and giddiness: Secondary | ICD-10-CM | POA: Diagnosis not present

## 2018-12-08 DIAGNOSIS — Z87898 Personal history of other specified conditions: Secondary | ICD-10-CM | POA: Diagnosis not present

## 2018-12-08 DIAGNOSIS — G9001 Carotid sinus syncope: Secondary | ICD-10-CM | POA: Diagnosis not present

## 2018-12-15 DIAGNOSIS — E119 Type 2 diabetes mellitus without complications: Secondary | ICD-10-CM | POA: Diagnosis not present

## 2018-12-15 DIAGNOSIS — R110 Nausea: Secondary | ICD-10-CM | POA: Diagnosis not present

## 2018-12-15 DIAGNOSIS — I10 Essential (primary) hypertension: Secondary | ICD-10-CM | POA: Diagnosis not present

## 2018-12-15 DIAGNOSIS — E7849 Other hyperlipidemia: Secondary | ICD-10-CM | POA: Diagnosis not present

## 2018-12-15 DIAGNOSIS — R5381 Other malaise: Secondary | ICD-10-CM | POA: Diagnosis not present

## 2019-01-30 DIAGNOSIS — I119 Hypertensive heart disease without heart failure: Secondary | ICD-10-CM | POA: Diagnosis not present

## 2019-01-30 DIAGNOSIS — E1149 Type 2 diabetes mellitus with other diabetic neurological complication: Secondary | ICD-10-CM | POA: Diagnosis not present

## 2019-01-30 DIAGNOSIS — S76312A Strain of muscle, fascia and tendon of the posterior muscle group at thigh level, left thigh, initial encounter: Secondary | ICD-10-CM | POA: Diagnosis not present

## 2019-01-30 DIAGNOSIS — E119 Type 2 diabetes mellitus without complications: Secondary | ICD-10-CM | POA: Diagnosis not present

## 2019-03-09 DIAGNOSIS — I119 Hypertensive heart disease without heart failure: Secondary | ICD-10-CM | POA: Diagnosis not present

## 2019-03-09 DIAGNOSIS — E119 Type 2 diabetes mellitus without complications: Secondary | ICD-10-CM | POA: Diagnosis not present

## 2019-03-09 DIAGNOSIS — E1149 Type 2 diabetes mellitus with other diabetic neurological complication: Secondary | ICD-10-CM | POA: Diagnosis not present

## 2019-06-24 DIAGNOSIS — E119 Type 2 diabetes mellitus without complications: Secondary | ICD-10-CM | POA: Diagnosis not present

## 2019-08-13 DIAGNOSIS — E119 Type 2 diabetes mellitus without complications: Secondary | ICD-10-CM | POA: Diagnosis not present

## 2019-08-13 DIAGNOSIS — H9190 Unspecified hearing loss, unspecified ear: Secondary | ICD-10-CM | POA: Diagnosis not present

## 2019-08-13 DIAGNOSIS — E1149 Type 2 diabetes mellitus with other diabetic neurological complication: Secondary | ICD-10-CM | POA: Diagnosis not present

## 2019-08-19 DIAGNOSIS — E1140 Type 2 diabetes mellitus with diabetic neuropathy, unspecified: Secondary | ICD-10-CM | POA: Diagnosis not present

## 2019-08-19 DIAGNOSIS — E119 Type 2 diabetes mellitus without complications: Secondary | ICD-10-CM | POA: Diagnosis not present

## 2019-08-19 DIAGNOSIS — I119 Hypertensive heart disease without heart failure: Secondary | ICD-10-CM | POA: Diagnosis not present

## 2019-08-19 DIAGNOSIS — M1711 Unilateral primary osteoarthritis, right knee: Secondary | ICD-10-CM | POA: Diagnosis not present

## 2019-09-28 ENCOUNTER — Other Ambulatory Visit: Payer: Self-pay | Admitting: Internal Medicine

## 2019-09-29 ENCOUNTER — Other Ambulatory Visit: Payer: Self-pay

## 2019-09-29 ENCOUNTER — Emergency Department: Payer: Medicare Other

## 2019-09-29 ENCOUNTER — Encounter: Payer: Self-pay | Admitting: Emergency Medicine

## 2019-09-29 ENCOUNTER — Emergency Department
Admission: EM | Admit: 2019-09-29 | Discharge: 2019-09-29 | Disposition: A | Discharge status: Home / Self Care | Payer: Medicare Other | Attending: Emergency Medicine | Admitting: Emergency Medicine

## 2019-09-29 DIAGNOSIS — Z79899 Other long term (current) drug therapy: Secondary | ICD-10-CM | POA: Diagnosis not present

## 2019-09-29 DIAGNOSIS — R0602 Shortness of breath: Secondary | ICD-10-CM | POA: Insufficient documentation

## 2019-09-29 DIAGNOSIS — J45909 Unspecified asthma, uncomplicated: Secondary | ICD-10-CM | POA: Insufficient documentation

## 2019-09-29 DIAGNOSIS — M7122 Synovial cyst of popliteal space [Baker], left knee: Secondary | ICD-10-CM | POA: Diagnosis not present

## 2019-09-29 DIAGNOSIS — R600 Localized edema: Secondary | ICD-10-CM | POA: Diagnosis not present

## 2019-09-29 DIAGNOSIS — I10 Essential (primary) hypertension: Secondary | ICD-10-CM | POA: Diagnosis not present

## 2019-09-29 DIAGNOSIS — M25562 Pain in left knee: Secondary | ICD-10-CM | POA: Diagnosis not present

## 2019-09-29 DIAGNOSIS — M7989 Other specified soft tissue disorders: Secondary | ICD-10-CM | POA: Diagnosis not present

## 2019-09-29 DIAGNOSIS — Z87891 Personal history of nicotine dependence: Secondary | ICD-10-CM | POA: Diagnosis not present

## 2019-09-29 LAB — HEPATIC FUNCTION PANEL
ALT: 18 U/L (ref 0–44)
AST: 22 U/L (ref 15–41)
Albumin: 3.8 g/dL (ref 3.5–5.0)
Alkaline Phosphatase: 91 U/L (ref 38–126)
Bilirubin, Direct: 0.1 mg/dL (ref 0.0–0.2)
Indirect Bilirubin: 0.9 mg/dL (ref 0.3–0.9)
Total Bilirubin: 1 mg/dL (ref 0.3–1.2)
Total Protein: 7.3 g/dL (ref 6.5–8.1)

## 2019-09-29 LAB — BASIC METABOLIC PANEL
Anion gap: 7 (ref 5–15)
BUN: 19 mg/dL (ref 8–23)
CO2: 24 mmol/L (ref 22–32)
Calcium: 9.6 mg/dL (ref 8.9–10.3)
Chloride: 107 mmol/L (ref 98–111)
Creatinine, Ser: 0.72 mg/dL (ref 0.44–1.00)
GFR calc Af Amer: >60 mL/min (ref >60)
GFR calc non Af Amer: >60 mL/min (ref >60)
Glucose, Bld: 266 mg/dL — ABNORMAL HIGH (ref 70–99)
Potassium: 3.3 mmol/L — ABNORMAL LOW (ref 3.5–5.1)
Sodium: 138 mmol/L (ref 135–145)

## 2019-09-29 LAB — BRAIN NATRIURETIC PEPTIDE: B Natriuretic Peptide: 89 pg/mL (ref 0.0–100.0)

## 2019-09-29 LAB — CBC
HCT: 36 % (ref 36.0–46.0)
Hemoglobin: 11.8 g/dL — ABNORMAL LOW (ref 12.0–15.0)
MCH: 31.1 pg (ref 26.0–34.0)
MCHC: 32.8 g/dL (ref 30.0–36.0)
MCV: 94.7 fL (ref 80.0–100.0)
Platelets: 203 10*3/uL (ref 150–400)
RBC: 3.8 MIL/uL — ABNORMAL LOW (ref 3.87–5.11)
RDW: 12 % (ref 11.5–15.5)
WBC: 6.2 10*3/uL (ref 4.0–10.5)
nRBC: 0 % (ref 0.0–0.2)

## 2019-09-29 LAB — TROPONIN I (HIGH SENSITIVITY)
Troponin I (High Sensitivity): 7 ng/L (ref <18)
Troponin I (High Sensitivity): 8 ng/L (ref <18)

## 2019-09-29 MED ORDER — POTASSIUM CHLORIDE CRYS ER 20 MEQ PO TBCR
40.0000 meq | EXTENDED_RELEASE_TABLET | Freq: Once | ORAL | Status: AC
  Administered 2019-09-29: 14:00:00 40 meq via ORAL
  Filled 2019-09-29: qty 2

## 2019-09-29 MED ORDER — SODIUM CHLORIDE 0.9% FLUSH: 3.0000 mL | Freq: Once | INTRAVENOUS | Status: DC

## 2019-09-29 NOTE — ED Provider Notes (Signed)
[facility] Emergency Department Provider Note  ____________________________________________   First MD Initiated Contact with Patient 09/29/19 1319     (approximate)  I have reviewed the triage vital signs and the nursing notes.   HISTORY  Chief Complaint Leg Swelling    HPI Vanessa Vazquez is a 85 y.o. female with diabetes, hypertension hypercholesterol who comes in with swelling of her legs.  Patient reports some swelling of her bilateral legs.  Patient was recently started on some Lasix but states that she is only taking a half a pill.  She not bring her medicine to know what dose it is.  Patient states that she is had worsening swelling around her knee has been constant, 1 day, nothing makes it better, nothing makes it worse.  She does report some shortness of breath but that is been more chronic in nature going on for months.  Does not seem to be different today and she denies any shortness of breath at rest but only when she gets up and is moving around.  She uses a walker to ambulate.          Past Medical History:  Diagnosis Date  . Asthma   . Diabetes mellitus without complication ([place])   . GERD (gastroesophageal reflux disease)   . Hypercholesteremia   . Hypertension     Patient Active Problem List   Diagnosis Date Noted  . GI bleed 03/17/2016  . Vaginal odor 12/14/2015  . Vulvar itching 12/14/2015  . Vaginal leukorrhea 12/14/2015  . Hypertension   . Hypercholesteremia   . Asthma     Past Surgical History:  Procedure Laterality Date  . CESAREAN SECTION    . COLONOSCOPY WITH PROPOFOL N/A 03/19/2016   Procedure: COLONOSCOPY WITH PROPOFOL;  Surgeon: Manya Silvas, MD;  Location: [facility] ENDOSCOPY;  Service: Endoscopy;  Laterality: N/A;    Prior to Admission medications   Medication Sig Start Date End Date Taking? Authorizing Provider  albuterol (PROVENTIL HFA;VENTOLIN HFA) 108 (90 Base) MCG/ACT inhaler Inhale 2 puffs into the lungs  every 4 (four) hours as needed for wheezing or shortness of breath. 07/26/18   Cuthriell, Charline Bills, PA-C  azithromycin (ZITHROMAX Z-PAK) 250 MG tablet Take 2 tablets (500 mg) on  Day 1,  followed by 1 tablet (250 mg) once daily on Days 2 through 5. Patient not taking: Reported on 09/17/2018 07/26/18   Cuthriell, Charline Bills, PA-C  furosemide (LASIX) 20 MG tablet Take 20 mg by mouth daily.    [provider]  glimepiride (AMARYL) 2 MG tablet Take 2 mg by mouth 2 (two) times daily.     [provider]  lisinopril (ZESTRIL) 20 MG tablet Take 20 mg by mouth daily.    [provider]  metFORMIN (GLUCOPHAGE) 1000 MG tablet Take 1,000 mg by mouth 2 (two) times daily.     [provider]  NONFORMULARY OR COMPOUNDED ITEM [facility]  Peripheral Neuropathy Cream- Bupivacaine 1%, Doxepin 3%, Gabapentin 6%, Pentoxifylline 3%, Topiramate 1% Apply 1-2 grams to affected area 3-4 times daily Qty. 120 gm 3 refills Patient not taking: Reported on 09/17/2018 10/26/16   Edrick Kins, DPM  predniSONE (DELTASONE) 50 MG tablet Take 1 tablet (50 mg total) by mouth daily with breakfast. Patient not taking: Reported on 09/17/2018 07/26/18   Cuthriell, Charline Bills, PA-C  pregabalin (LYRICA) 50 MG capsule TAKE 1 CAPSULE BY MOUTH TWICE DAILY 09/29/19   Cletis Athens, MD  sitaGLIPtin (JANUVIA) 100 MG tablet  Take 100 mg by mouth daily.    [provider]    Allergies Patient has no known allergies.  Family History  Problem Relation Age of Onset  . Lung cancer Father   . Leukemia Sister   . Diabetes Sister   . Asthma Child     Social History Social History   Tobacco Use  . Smoking status: Former Research scientist (life sciences)  . Smokeless tobacco: Never Used  Substance Use Topics  . Alcohol use: No    Alcohol/week: 0.0 standard drinks  . Drug use: No      Review of Systems Constitutional: No fever/chills Eyes: No visual changes. ENT: No sore throat. Cardiovascular: Denies chest  pain. Respiratory: Chronic shortness of breath Gastrointestinal: No abdominal pain.  No nausea, no vomiting.  No diarrhea.  No constipation. Genitourinary: Negative for dysuria. Musculoskeletal: Negative for back pain.  Leg swelling left greater than right Skin: Negative for rash. Neurological: Negative for headaches, focal weakness or numbness. All other ROS negative ____________________________________________   PHYSICAL EXAM:  VITAL SIGNS: ED Triage Vitals  Enc Vitals Group     BP 09/29/19 1017 (!) 163/56     Pulse Rate 09/29/19 1017 85     Resp 09/29/19 1017 18     Temp 09/29/19 1017 98.1 F (36.7 C)     Temp Source 09/29/19 1017 Oral     SpO2 09/29/19 1017 95 %     Weight 09/29/19 1031 242 lb 6.4 oz (110 kg)     Height 09/29/19 1017 5\' 2"  (1.575 m)     Head Circumference --      Peak Flow --      Pain Score 09/29/19 1017 0     Pain Loc --      Pain Edu? --      Excl. in [place]? --     Constitutional: Alert and oriented. Well appearing and in no acute distress. Eyes: Conjunctivae are normal. EOMI. Head: Atraumatic. Nose: No congestion/rhinnorhea. Mouth/Throat: Mucous membranes are moist.   Neck: No stridor. Trachea Midline. FROM Cardiovascular: Normal rate, regular rhythm. Grossly normal heart sounds.  Good peripheral circulation. Respiratory: Normal respiratory effort.  No retractions. Lungs CTAB. Gastrointestinal: Soft and nontender. No distention. No abdominal bruits.  Musculoskeletal: Slight swelling noted to the left knee but range of motion intact.  No warmth or erythema.  Good distal pulses bilaterally no joint effusions. Neurologic:  Normal speech and language. No gross focal neurologic deficits are appreciated.  Skin:  Skin is warm, dry and intact. No rash noted. Psychiatric: Mood and affect are normal. Speech and behavior are normal. GU: Deferred   ____________________________________________   LABS (all labs ordered are listed, but only abnormal results  are displayed)  Labs Reviewed  BASIC METABOLIC PANEL - Abnormal; Notable for the following components:      Result Value   Potassium 3.3 (*)    Glucose, Bld 266 (*)    All other components within normal limits  CBC - Abnormal; Notable for the following components:   RBC 3.80 (*)    Hemoglobin 11.8 (*)    All other components within normal limits  HEPATIC FUNCTION PANEL  BRAIN NATRIURETIC PEPTIDE  TROPONIN I (HIGH SENSITIVITY)  TROPONIN I (HIGH SENSITIVITY)   ____________________________________________   ED ECG REPORT I, Vanessa Biron, the attending physician, personally viewed and interpreted this ECG.  EKG shows normal sinus rate of 80 with a intraventricular conduction block most likely left bundle branch block, no ST elevation, T wave  inversion in aVL.  Looks similar to prior EKG ____________________________________________  RADIOLOGY Robert Bellow, personally viewed and evaluated these images (plain radiographs) as part of my medical decision making, as well as reviewing the written report by the radiologist.  ED MD interpretation: No signs of effusion  Official radiology report(s): DG Chest 2 View  Result Date: 09/29/2019 CLINICAL DATA:  Shortness of breath. Leg swelling. EXAM: CHEST - 2 VIEW COMPARISON:  Two-view chest x-ray 09/06/2018 FINDINGS: The heart size is normal. Atherosclerotic calcifications are present in the aortic arch. No edema or effusion is present. No focal airspace disease is evident. Mild degenerative changes in the thoracic spine are stable. IMPRESSION: 1. No acute cardiopulmonary disease or significant interval change. 2. Atherosclerosis. Electronically Signed   By: San Morelle M.D.   On: 09/29/2019 10:58    ____________________________________________   PROCEDURES  Procedure(s) performed (including Critical Care):  .1-3 Lead EKG Interpretation Performed by: Vanessa Dougherty, MD Authorized by: Vanessa Martelle, MD     Interpretation:  normal     ECG rate:  80s   ECG rate assessment: normal     Rhythm: sinus rhythm     Ectopy: none     Conduction: normal       ____________________________________________   INITIAL IMPRESSION / ASSESSMENT AND PLAN / ED COURSE  Vanessa Vazquez was evaluated in Emergency Department on 09/29/2019 for the symptoms described in the history of present illness. She was evaluated in the context of the global COVID-19 pandemic, which necessitated consideration that the patient might be at risk for infection with the SARS-CoV-2 virus that causes COVID-19. Institutional protocols and algorithms that pertain to the evaluation of patients at risk for COVID-19 are in a state of rapid change based on information released by regulatory bodies including the CDC and federal and state organizations. These policies and algorithms were followed during the patient's care in the ED.    Patient is a 85 year old who comes in with most notable concern for leg swelling left greater than right.  Will get ultrasound to evaluate for DVT.  Will get labs to evaluate for Electra abnormalities, AKI.  BNP to evaluate for heart failure.  Will get chest x-ray to evaluate for pleural effusion.  I have lower suspicion for PE given her shortness of breath seems to be more chronic in nature and she satting 98 to 100% with ambulating.  She is got no wheezing on exam.  DVT ultrasounds are positive will consider doing a PE scan at that time.  Patient is knee does not show signs of septic joint she had good distal pulse.  We will keep patient on the cardiac monitor due to concerns for shortness of breath  Potassium slightly low at 3.3 Hemoglobin slightly lower than baseline 11.8 the last check was over a year ago and she is had some previous checks prior to that in the 11 range Chest x-ray no evidence of edema  Patient's ultrasound was consistent with Baker's cyst.  X-ray shows small effusion.  This time do not think it is infected.  We  discussed symptomatic treatment and following up with orthopedic surgery.  Her labs otherwise are reassuring, cardiac markers rule out for ACS,, Bnp was normal  Patient feels comfortable to be discharged at this time.  I discussed the provisional nature of ED diagnosis, the treatment so far, the ongoing plan of care, follow up appointments and return precautions with the patient and any family or support people present.  They expressed understanding and agreed with the plan, discharged home.         ____________________________________________   FINAL CLINICAL IMPRESSION(S) / ED DIAGNOSES   Final diagnoses:  Baker cyst, left      MEDICATIONS GIVEN DURING THIS VISIT:  Medications  sodium chloride flush (NS) 0.9 % injection 3 mL (has no administration in time range)  potassium chloride SA (KLOR-CON) CR tablet 40 mEq (40 mEq Oral Given 09/29/19 1406)     ED Discharge Orders    None       Note:  This document was prepared using Dragon voice recognition software and may include unintentional dictation errors.   Vanessa Pickett, MD 09/29/19 1537

## 2019-09-29 NOTE — Discharge Instructions (Addendum)
Your ultrasound shows a Baker's cyst.  You should follow-up with orthopedics.  The meantime you can take Tylenol 1 g every 8 hours and ibuprofen 400 every 8 hours with food for the next 5 days until he can get follow-up.  Patient return to ER if develop redness, swelling or any other concerns.  Other Findings: Note is made of an approximately 4.9 x 1.2 x 3.2 cm  serpiginous fluid collection within the left popliteal fossa  compatible with a Baker's cyst.     IMPRESSION:  No evidence of DVT within either lower extremity.

## 2019-09-29 NOTE — ED Triage Notes (Addendum)
C/O bilateral foot and ankle swelling "for a minute" and left knee swelling this morning.  Also c/o persistent SOB  AAOx3.  Skin warm and dry. NAD

## 2019-09-29 NOTE — ED Notes (Signed)
Pt ambulatory with pulse ox monitoring with walker. Pt maintained oxygen saturation of 99-100% while ambulating. Pt did seem to become slightly SOB. Pt reported SOB is within her normal range.

## 2019-09-29 NOTE — ED Notes (Signed)
Up to toilet to void with walker.  Ambulates well.

## 2019-10-12 ENCOUNTER — Ambulatory Visit (INDEPENDENT_AMBULATORY_CARE_PROVIDER_SITE_OTHER): Payer: Medicare Other | Admitting: Internal Medicine

## 2019-10-12 ENCOUNTER — Other Ambulatory Visit: Payer: Self-pay

## 2019-10-12 DIAGNOSIS — M1712 Unilateral primary osteoarthritis, left knee: Secondary | ICD-10-CM | POA: Diagnosis not present

## 2019-10-12 DIAGNOSIS — Z538 Procedure and treatment not carried out for other reasons: Secondary | ICD-10-CM

## 2019-10-13 NOTE — Progress Notes (Signed)
Appointment cancelled

## 2019-10-15 ENCOUNTER — Other Ambulatory Visit: Payer: Self-pay | Admitting: Internal Medicine

## 2019-10-21 ENCOUNTER — Encounter: Payer: Self-pay | Admitting: Internal Medicine

## 2019-10-21 ENCOUNTER — Ambulatory Visit (INDEPENDENT_AMBULATORY_CARE_PROVIDER_SITE_OTHER): Payer: Medicare Other | Admitting: Internal Medicine

## 2019-10-21 ENCOUNTER — Other Ambulatory Visit: Payer: Self-pay

## 2019-10-21 VITALS — BP 158/69 | HR 78 | Wt 238.6 lb

## 2019-10-21 DIAGNOSIS — E7800 Pure hypercholesterolemia, unspecified: Secondary | ICD-10-CM | POA: Diagnosis not present

## 2019-10-21 DIAGNOSIS — E119 Type 2 diabetes mellitus without complications: Secondary | ICD-10-CM

## 2019-10-21 DIAGNOSIS — J301 Allergic rhinitis due to pollen: Secondary | ICD-10-CM | POA: Diagnosis not present

## 2019-10-21 DIAGNOSIS — I10 Essential (primary) hypertension: Secondary | ICD-10-CM | POA: Diagnosis not present

## 2019-10-21 LAB — GLUCOSE, POCT (MANUAL RESULT ENTRY): POC Glucose: 238 mg/dl — AB (ref 70–99)

## 2019-10-21 NOTE — Assessment & Plan Note (Signed)
Patient need to follow her diet to control the blood sugar.

## 2019-10-21 NOTE — Assessment & Plan Note (Signed)
She was instructed to lose weight.

## 2019-10-21 NOTE — Assessment & Plan Note (Signed)
Labile 

## 2019-10-21 NOTE — Patient Instructions (Signed)

## 2019-10-21 NOTE — Assessment & Plan Note (Signed)
Diet explained 

## 2019-10-21 NOTE — Progress Notes (Signed)
Established Patient Office Visit  Subjective:  Patient ID: Vanessa Vazquez, female    DOB: [birth year]  Age: 85 y.o. MRN: [id number]  CC:  Chief Complaint  Patient presents with  . Diabetes  . Hypertension    HPI  [name] presents for blood sugar and high blood pressure check.  Vanessa Vazquez denies any chest pain shortness of breath.  Vanessa Vazquez has a problem with obesity is instructed on low-cholesterol and low carbohydrate diet.  Vanessa Vazquez is limited on her walking because of her knee pain due to osteoarthritis.,  Vanessa Vazquez also complains of swelling of the feet.  Patient does not follow any special kind of diet.  Her blood sugar was found to be 238 today.  Past Medical History:  Diagnosis Date  . Asthma   . Diabetes mellitus without complication ([place])   . GERD (gastroesophageal reflux disease)   . Hypercholesteremia   . Hypertension     Past Surgical History:  Procedure Laterality Date  . CESAREAN SECTION    . COLONOSCOPY WITH PROPOFOL N/A 03/19/2016   Procedure: COLONOSCOPY WITH PROPOFOL;  Surgeon: Manya Silvas, MD;  Location: [facility] ENDOSCOPY;  Service: Endoscopy;  Laterality: N/A;    Family History  Problem Relation Age of Onset  . Lung cancer Father   . Leukemia Sister   . Diabetes Sister   . Asthma Child     Social History   Socioeconomic History  . Marital status: Widowed    Spouse name: Not on file  . Number of children: Not on file  . Years of education: Not on file  . Highest education level: Not on file  Occupational History  . Not on file  Tobacco Use  . Smoking status: Former Research scientist (life sciences)  . Smokeless tobacco: Never Used  Substance and Sexual Activity  . Alcohol use: No    Alcohol/week: 0.0 standard drinks  . Drug use: No  . Sexual activity: Never  Other Topics Concern  . Not on file  Social History Narrative  . Not on file   Social Determinants of Health   Financial Resource Strain:   . Difficulty of Paying Living Expenses:   Food Insecurity:   . Worried About  Charity fundraiser in the Last Year:   . Arboriculturist in the Last Year:   Transportation Needs:   . Film/video editor (Medical):   Marland Kitchen Lack of Transportation (Non-Medical):   Physical Activity:   . Days of Exercise per Week:   . Minutes of Exercise per Session:   Stress:   . Feeling of Stress :   Social Connections:   . Frequency of Communication with Friends and Family:   . Frequency of Social Gatherings with Friends and Family:   . Attends Religious Services:   . Active Member of Clubs or Organizations:   . Attends Archivist Meetings:   Marland Kitchen Marital Status:   Intimate Partner Violence:   . Fear of Current or Ex-Partner:   . Emotionally Abused:   Marland Kitchen Physically Abused:   . Sexually Abused:      Current Outpatient Medications:  .  albuterol (PROVENTIL HFA;VENTOLIN HFA) 108 (90 Base) MCG/ACT inhaler, Inhale 2 puffs into the lungs every 4 (four) hours as needed for wheezing or shortness of breath., Disp: 1 Inhaler, Rfl: 0 .  atenolol (TENORMIN) 25 MG tablet, Take 25 mg by mouth daily., Disp: , Rfl:  .  atorvastatin (LIPITOR) 40 MG tablet, Take 40 mg by mouth daily.,  Disp: , Rfl:  .  furosemide (LASIX) 20 MG tablet, Take 20 mg by mouth every other day. , Disp: , Rfl:  .  glimepiride (AMARYL) 2 MG tablet, TAKE 1 TABLET BY MOUTH TWICE DAILY, Disp: 180 tablet, Rfl: 3 .  losartan-hydrochlorothiazide (HYZAAR) 100-25 MG tablet, Take 1 tablet by mouth daily., Disp: , Rfl:  .  metFORMIN (GLUCOPHAGE) 1000 MG tablet, Take 1,000 mg by mouth 2 (two) times daily. , Disp: , Rfl:  .  pregabalin (LYRICA) 50 MG capsule, TAKE 1 CAPSULE BY MOUTH TWICE DAILY (Patient taking differently: Take 50 mg by mouth 2 (two) times daily. ), Disp: 60 capsule, Rfl: 6 .  sitaGLIPtin (JANUVIA) 100 MG tablet, Take 100 mg by mouth daily., Disp: , Rfl:    No Known Allergies  ROS Review of Systems  Constitutional: Negative.   HENT: Negative.   Eyes: Negative.  Negative for pain.  Respiratory: Positive  for wheezing. Negative for cough, choking and shortness of breath.   Cardiovascular: Negative for chest pain and leg swelling.  Gastrointestinal: Negative for constipation.  Genitourinary: Negative for urgency.  Musculoskeletal: Negative.   Allergic/Immunologic: Negative for environmental allergies.  Neurological: Negative.       Objective:    Physical Exam  Constitutional: Vanessa Vazquez appears well-developed and well-nourished.  HENT:  Head: Normocephalic and atraumatic.  Eyes: Pupils are equal, round, and reactive to light.  Cardiovascular: Normal rate and normal heart sounds.  Pulmonary/Chest: Effort normal. Vanessa Vazquez has no wheezes.  Abdominal: Soft. There is no abdominal tenderness.  Musculoskeletal:        General: Edema (1+) present.     Cervical back: Normal range of motion.  Skin: No erythema.  Psychiatric: Her behavior is normal.    BP (!) 158/69   Pulse 78   Wt 238 lb 9.6 oz (108.2 kg)   BMI 43.64 kg/m  Wt Readings from Last 3 Encounters:  10/21/19 238 lb 9.6 oz (108.2 kg)  09/29/19 242 lb 6.4 oz (110 kg)  09/17/18 229 lb 4.5 oz (104 kg)     Health Maintenance Due  Topic Date Due  . HEMOGLOBIN A1C  Never done  . FOOT EXAM  Never done  . OPHTHALMOLOGY EXAM  Never done  . COVID-19 Vaccine (1) Never done  . TETANUS/TDAP  Never done  . PNA vac Low Risk Adult (2 of 2 - PCV13) 04/11/2004    There are no preventive care reminders to display for this patient.  No results found for: TSH Lab Results  Component Value Date   WBC 6.2 09/29/2019   HGB 11.8 (L) 09/29/2019   HCT 36.0 09/29/2019   MCV 94.7 09/29/2019   PLT 203 09/29/2019   Lab Results  Component Value Date   NA 138 09/29/2019   K 3.3 (L) 09/29/2019   CO2 24 09/29/2019   GLUCOSE 266 (H) 09/29/2019   BUN 19 09/29/2019   CREATININE 0.72 09/29/2019   BILITOT 1.0 09/29/2019   ALKPHOS 91 09/29/2019   AST 22 09/29/2019   ALT 18 09/29/2019   PROT 7.3 09/29/2019   ALBUMIN 3.8 09/29/2019   CALCIUM 9.6  09/29/2019   ANIONGAP 7 09/29/2019   No results found for: CHOL No results found for: HDL No results found for: LDLCALC No results found for: TRIG No results found for: CHOLHDL No results found for: HGBA1C    Assessment & Plan:   Problem List Items Addressed This Visit      Cardiovascular and Mediastinum   Hypertension  Labile.        Respiratory   Seasonal allergic rhinitis due to pollen     Endocrine   Type 2 diabetes mellitus without complication, without long-term current use of insulin ([place]) - Primary    Patient need to follow her diet to control the blood sugar.      Relevant Orders   POCT glucose (manual entry) (Completed)   POCT CBG (Fasting - Glucose)     Other   Hypercholesteremia    Diet explained.      Class 2 severe obesity due to excess calories with serious comorbidity in adult [facility])    Vanessa Vazquez was instructed to lose weight.          1. Type 2 diabetes mellitus without complication, without long-term current use of insulin ([place]) Patient was instructed to control her diabetes - POCT glucose (238)   2. Essential hypertension Blood pressure is labile Vanessa Vazquez was advised to follow a low-salt diet  3. Seasonal allergic rhinitis due to pollen Take Claritin as needed allergy and nasal stuffiness.  4. Class 2 severe obesity due to excess calories with serious comorbidity in adult, unspecified BMI ([place]) Patient was advised to keep on working to lose weight.  5. Hypercholesteremia Controlled lipid with diet at the present time. Follow-up: Return in 4 weeks (on 11/18/2019).    Cletis Athens, MD

## 2019-11-23 ENCOUNTER — Encounter: Payer: Self-pay | Admitting: Internal Medicine

## 2019-11-23 ENCOUNTER — Other Ambulatory Visit: Payer: Self-pay

## 2019-11-23 ENCOUNTER — Ambulatory Visit (INDEPENDENT_AMBULATORY_CARE_PROVIDER_SITE_OTHER): Payer: Medicare Other | Admitting: Internal Medicine

## 2019-11-23 VITALS — BP 108/79 | HR 74 | Ht 64.0 in | Wt 243.0 lb

## 2019-11-23 DIAGNOSIS — E119 Type 2 diabetes mellitus without complications: Secondary | ICD-10-CM | POA: Diagnosis not present

## 2019-11-23 DIAGNOSIS — Z6841 Body Mass Index (BMI) 40.0 and over, adult: Secondary | ICD-10-CM | POA: Diagnosis not present

## 2019-11-23 DIAGNOSIS — I10 Essential (primary) hypertension: Secondary | ICD-10-CM

## 2019-11-23 LAB — GLUCOSE, POCT (MANUAL RESULT ENTRY): POC Glucose: 208 mg/dl — AB (ref 70–99)

## 2019-11-23 NOTE — Assessment & Plan Note (Signed)
-   Today, the patient's blood pressure is well managed on losartan and atenolol - The patient will continue the current treatment regimen.  - I encouraged the patient to eat a low-sodium diet to help control blood pressure. - I encouraged the patient to live an active lifestyle and complete activities that increases heart rate to 85% target heart rate at least 5 times per week for one hour.

## 2019-11-23 NOTE — Addendum Note (Signed)
Addended by: Anson [place] R on: 11/23/2019 10:59 AM   Modules accepted: Orders

## 2019-11-23 NOTE — Progress Notes (Signed)
Established Patient Office Visit  SUBJECTIVE:  Subjective  Patient ID: Vanessa Vazquez, female    DOB: 1935-05-07  Age: 85 y.o. MRN: [id number]  CC:  Chief Complaint  Patient presents with   Diabetes    1 month follow up    HPI Vanessa Vazquez is a 85 y.o. female presenting today for 1 month diabetes follow-up.  Today she reports that her blood sugar is elevated. She checks her blood sugar at home, though not daily. Both of her ankles have been swollen, after which she will take Lasix and the swelling will go down. She has neuropathy in her bilat feet and reports that the Lyrica is not helping. She reports occasional fatigue during the day. She tries to drink plenty of fluids daily and has been cutting down on her salt intake. Otherwise she denies any issues with her vision, ears, swallowing issues, heartburn, CP, abdominal pain, hematochezia or hematuria.  She had a colonoscopy in 02/2016. She is compliant with her meds. She no longer smokes.  Past Medical History:  Diagnosis Date   Asthma    Diabetes mellitus without complication ([place])    GERD (gastroesophageal reflux disease)    Hypercholesteremia    Hypertension     Past Surgical History:  Procedure Laterality Date   CESAREAN SECTION     COLONOSCOPY WITH PROPOFOL N/A 03/19/2016   Procedure: COLONOSCOPY WITH PROPOFOL;  Surgeon: Manya Silvas, MD;  Location: [facility];  Service: Endoscopy;  Laterality: N/A;    Family History  Problem Relation Age of Onset   Lung cancer Father    Leukemia Sister    Diabetes Sister    Asthma Child     Social History   Socioeconomic History   Marital status: Widowed    Spouse name: Not on file   Number of children: Not on file   Years of education: Not on file   Highest education level: Not on file  Occupational History   Not on file  Tobacco Use   Smoking status: Former Smoker   Smokeless tobacco: Never Used  Substance and Sexual Activity   Alcohol  use: No    Alcohol/week: 0.0 standard drinks   Drug use: No   Sexual activity: Never  Other Topics Concern   Not on file  Social History Narrative   Not on file   Social Determinants of Health   Financial Resource Strain:    Difficulty of Paying Living Expenses:   Food Insecurity:    Worried About Charity fundraiser in the Last Year:    Arboriculturist in the Last Year:   Transportation Needs:    Film/video editor (Medical):    Lack of Transportation (Non-Medical):   Physical Activity:    Days of Exercise per Week:    Minutes of Exercise per Session:   Stress:    Feeling of Stress :   Social Connections:    Frequency of Communication with Friends and Family:    Frequency of Social Gatherings with Friends and Family:    Attends Religious Services:    Active Member of Clubs or Organizations:    Attends Music therapist:    Marital Status:   Intimate Partner Violence:    Fear of Current or Ex-Partner:    Emotionally Abused:    Physically Abused:    Sexually Abused:      Current Outpatient Medications:    albuterol (PROVENTIL HFA;VENTOLIN HFA) 108 (90 Base) MCG/ACT inhaler,  Inhale 2 puffs into the lungs every 4 (four) hours as needed for wheezing or shortness of breath., Disp: 1 Inhaler, Rfl: 0   atenolol (TENORMIN) 25 MG tablet, Take 25 mg by mouth daily., Disp: , Rfl:    atorvastatin (LIPITOR) 40 MG tablet, Take 40 mg by mouth daily., Disp: , Rfl:    furosemide (LASIX) 20 MG tablet, Take 20 mg by mouth every other day. , Disp: , Rfl:    glimepiride (AMARYL) 2 MG tablet, TAKE 1 TABLET BY MOUTH TWICE DAILY, Disp: 180 tablet, Rfl: 3   losartan-hydrochlorothiazide (HYZAAR) 100-25 MG tablet, Take 1 tablet by mouth daily., Disp: , Rfl:    metFORMIN (GLUCOPHAGE) 1000 MG tablet, Take 1,000 mg by mouth 2 (two) times daily. , Disp: , Rfl:    pregabalin (LYRICA) 50 MG capsule, TAKE 1 CAPSULE BY MOUTH TWICE DAILY (Patient taking  differently: Take 50 mg by mouth 2 (two) times daily. ), Disp: 60 capsule, Rfl: 6   sitaGLIPtin (JANUVIA) 100 MG tablet, Take 100 mg by mouth daily., Disp: , Rfl:    No Known Allergies  ROS Review of Systems  Constitutional: Negative.   HENT: Negative.  Negative for ear pain and trouble swallowing.   Eyes: Negative.  Negative for visual disturbance.  Respiratory: Negative.   Cardiovascular: Positive for leg swelling (bilat ankles). Negative for chest pain.  Gastrointestinal: Negative.  Negative for abdominal pain and blood in stool.  Endocrine: Negative.   Genitourinary: Negative.  Negative for hematuria.  Musculoskeletal: Negative.   Skin: Negative.   Allergic/Immunologic: Negative.   Neurological: Negative.   Hematological: Negative.   Psychiatric/Behavioral: Negative.   All other systems reviewed and are negative.    OBJECTIVE:    Physical Exam Vitals reviewed.  Constitutional:      Appearance: Normal appearance.  HENT:     Mouth/Throat:     Mouth: Mucous membranes are moist.  Eyes:     Pupils: Pupils are equal, round, and reactive to light.  Neck:     Vascular: No carotid bruit.  Cardiovascular:     Rate and Rhythm: Normal rate and regular rhythm.     Pulses: Normal pulses.          Dorsalis pedis pulses are 2+ on the right side and 2+ on the left side.     Heart sounds: Normal heart sounds.  Pulmonary:     Effort: Pulmonary effort is normal.     Breath sounds: Normal breath sounds. No wheezing.  Abdominal:     Palpations: Abdomen is soft. There is no hepatomegaly, splenomegaly or mass.     Tenderness: There is no abdominal tenderness.  Musculoskeletal:     Right lower leg: Edema (1.5+) present.     Left lower leg: Edema (1.5+) present.  Neurological:     General: No focal deficit present.     Mental Status: She is alert and oriented to person, place, and time.  Psychiatric:        Mood and Affect: Mood and affect normal.        Behavior: Behavior normal.       BP 108/79    Pulse 74    Ht 5\' 4"  (1.626 m)    Wt 243 lb (110.2 kg)    BMI 41.71 kg/m  Wt Readings from Last 3 Encounters:  11/23/19 243 lb (110.2 kg)  10/21/19 238 lb 9.6 oz (108.2 kg)  09/29/19 242 lb 6.4 oz (110 kg)    Health Maintenance Due  Topic Date Due   HEMOGLOBIN A1C  Never done   FOOT EXAM  Never done   OPHTHALMOLOGY EXAM  Never done   COVID-19 Vaccine (1) Never done   TETANUS/TDAP  Never done   PNA vac Low Risk Adult (2 of 2 - PCV13) 04/11/2004    There are no preventive care reminders to display for this patient.  CBC Latest Ref Rng & Units 09/29/2019 09/17/2018 09/06/2018  WBC 4.0 - 10.5 K/uL 6.2 5.5 5.7  Hemoglobin 12.0 - 15.0 g/dL 11.8(L) 13.1 12.4  Hematocrit 36 - 46 % 36.0 41.3 39.0  Platelets 150 - 400 K/uL 203 219 222   CMP Latest Ref Rng & Units 09/29/2019 09/17/2018 09/06/2018  Glucose 70 - 99 mg/dL 266(H) 167(H) 281(H)  BUN 8 - 23 mg/dL 19 15 14   Creatinine 0.44 - 1.00 mg/dL 0.72 0.88 0.85  Sodium 135 - 145 mmol/L 138 141 139  Potassium 3.5 - 5.1 mmol/L 3.3(L) 3.8 3.8  Chloride 98 - 111 mmol/L 107 108 104  CO2 22 - 32 mmol/L 24 25 26   Calcium 8.9 - 10.3 mg/dL 9.6 9.6 9.5  Total Protein 6.5 - 8.1 g/dL 7.3 6.7 6.8  Total Bilirubin 0.3 - 1.2 mg/dL 1.0 0.7 0.6  Alkaline Phos 38 - 126 U/L 91 73 88  AST 15 - 41 U/L 22 18 19   ALT 0 - 44 U/L 18 18 14     No results found for: TSH Lab Results  Component Value Date   ALBUMIN 3.8 09/29/2019   ANIONGAP 7 09/29/2019   No results found for: CHOL, HDL, LDLCALC, CHOLHDL No results found for: TRIG No results found for: HGBA1C    ASSESSMENT & PLAN:   Problem List Items Addressed This Visit      Cardiovascular and Mediastinum   Hypertension    - Today, the patient's blood pressure is well managed on losartan and atenolol - The patient will continue the current treatment regimen.  - I encouraged the patient to eat a low-sodium diet to help control blood pressure. - I encouraged the patient to live  an active lifestyle and complete activities that increases heart rate to 85% target heart rate at least 5 times per week for one hour.           Endocrine   Type 2 diabetes mellitus without complication, without long-term current use of insulin ([place]) - Primary    - The patient's blood sugar is not under control on metformin , januviai and   glimpride - The patient will continue the current treatment regimen.  - I encouraged the patient to regularly check blood sugar.  - I encouraged the patient to monitor diet. I encouraged the patient to eat low-carb and low-sugar to help prevent blood sugar spikes.  - I encouraged the patient to continue following their prescribed treatment plan for diabetes - I informed the patient to get help if blood sugar drops below 54mg /dL, or if suddenly have trouble thinking clearly or breathing.         Relevant Orders   POCT glucose (manual entry) (Completed)     Other   Class 3 severe obesity due to excess calories with serious comorbidity and body mass index (BMI) of 40.0 to 44.9 in adult [facility])    - I encouraged the patient to lose weight.  - I educated them on making healthy dietary choices including eating more fruits and vegetables and less fried foods. - I encouraged the patient to exercise more, and  educated on the benefits of exercise including weight loss, diabetes management, and hypertension management.          No orders of the defined types were placed in this encounter.   Type 2 diabetes mellitus without complication, without long-term current use of insulin ([place]) - Plan: POCT glucose (manual entry)  Essential hypertension  Class 3 severe obesity due to excess calories with serious comorbidity and body mass index (BMI) of 40.0 to 44.9 in adult [facility])  Follow-up: Return in about 4 weeks (around 12/21/2019).    Dr. Jane Canary [facility] [address] [place], [place] [address]   By signing my name below, I,  Milinda Antis, attest that this documentation has been prepared under the direction and in the presence of Cletis Athens, MD. Electronically Signed: Cletis Athens, MD 11/23/19, 10:32 AM   I personally performed the services described in this documentation, which was SCRIBED in my presence. The recorded information has been reviewed and considered accurate. It has been edited as necessary during review. Cletis Athens, MD

## 2019-11-23 NOTE — Assessment & Plan Note (Signed)
-   The patient's blood sugar is not under control on metformin , januviai and   glimpride - The patient will continue the current treatment regimen.  - I encouraged the patient to regularly check blood sugar.  - I encouraged the patient to monitor diet. I encouraged the patient to eat low-carb and low-sugar to help prevent blood sugar spikes.  - I encouraged the patient to continue following their prescribed treatment plan for diabetes - I informed the patient to get help if blood sugar drops below 54mg /dL, or if suddenly have trouble thinking clearly or breathing.

## 2019-11-23 NOTE — Assessment & Plan Note (Signed)
-   I encouraged the patient to lose weight.  - I educated them on making healthy dietary choices including eating more fruits and vegetables and less fried foods. - I encouraged the patient to exercise more, and educated on the benefits of exercise including weight loss, diabetes management, and hypertension management.   

## 2019-11-24 LAB — CBC WITH DIFFERENTIAL/PLATELET
Absolute Monocytes: 462 cells/uL (ref 200–950)
Basophils Absolute: 63 cells/uL (ref 0–200)
Basophils Relative: 1.1 %
Eosinophils Absolute: 103 cells/uL (ref 15–500)
Eosinophils Relative: 1.8 %
HCT: 34.2 % — ABNORMAL LOW (ref 35.0–45.0)
Hemoglobin: 11.1 g/dL — ABNORMAL LOW (ref 11.7–15.5)
Lymphs Abs: 1756 cells/uL (ref 850–3900)
MCH: 31.4 pg (ref 27.0–33.0)
MCHC: 32.5 g/dL (ref 32.0–36.0)
MCV: 96.6 fL (ref 80.0–100.0)
MPV: 12.4 fL (ref 7.5–12.5)
Monocytes Relative: 8.1 %
Neutro Abs: 3317 cells/uL (ref 1500–7800)
Neutrophils Relative %: 58.2 %
Platelets: 214 10*3/uL (ref 140–400)
RBC: 3.54 10*6/uL — ABNORMAL LOW (ref 3.80–5.10)
RDW: 11.6 % (ref 11.0–15.0)
Total Lymphocyte: 30.8 %
WBC: 5.7 10*3/uL (ref 3.8–10.8)

## 2019-11-24 LAB — COMPLETE METABOLIC PANEL WITH GFR
AG Ratio: 1.4 (calc) (ref 1.0–2.5)
ALT: 17 U/L (ref 6–29)
AST: 22 U/L (ref 10–35)
Albumin: 3.8 g/dL (ref 3.6–5.1)
Alkaline phosphatase (APISO): 74 U/L (ref 37–153)
BUN: 21 mg/dL (ref 7–25)
CO2: 23 mmol/L (ref 20–32)
Calcium: 9.6 mg/dL (ref 8.6–10.4)
Chloride: 105 mmol/L (ref 98–110)
Creat: 0.87 mg/dL (ref 0.60–0.88)
GFR, Est African American: 72 mL/min/{1.73_m2} (ref >=60)
GFR, Est Non African American: 62 mL/min/{1.73_m2} (ref >=60)
Globulin: 2.8 g/dL (calc) (ref 1.9–3.7)
Glucose, Bld: 133 mg/dL — ABNORMAL HIGH (ref 65–99)
Potassium: 3.6 mmol/L (ref 3.5–5.3)
Sodium: 142 mmol/L (ref 135–146)
Total Bilirubin: 0.8 mg/dL (ref 0.2–1.2)
Total Protein: 6.6 g/dL (ref 6.1–8.1)

## 2019-11-24 LAB — HEMOGLOBIN A1C
Hgb A1c MFr Bld: 7.1 % of total Hgb — ABNORMAL HIGH (ref <5.7)
Mean Plasma Glucose: 157 (calc)
eAG (mmol/L): 8.7 (calc)

## 2019-11-24 LAB — TSH: TSH: 0.56 mIU/L (ref 0.40–4.50)

## 2019-12-05 ENCOUNTER — Other Ambulatory Visit: Payer: Self-pay | Admitting: Internal Medicine

## 2019-12-07 ENCOUNTER — Ambulatory Visit (INDEPENDENT_AMBULATORY_CARE_PROVIDER_SITE_OTHER): Payer: Medicare Other | Admitting: Internal Medicine

## 2019-12-07 ENCOUNTER — Other Ambulatory Visit: Payer: Self-pay

## 2019-12-07 ENCOUNTER — Encounter: Payer: Self-pay | Admitting: Internal Medicine

## 2019-12-07 VITALS — BP 136/73 | HR 74 | Ht 64.0 in | Wt 242.1 lb

## 2019-12-07 DIAGNOSIS — E66813 Obesity, class 3: Secondary | ICD-10-CM

## 2019-12-07 DIAGNOSIS — Z6841 Body Mass Index (BMI) 40.0 and over, adult: Secondary | ICD-10-CM

## 2019-12-07 DIAGNOSIS — E119 Type 2 diabetes mellitus without complications: Secondary | ICD-10-CM | POA: Diagnosis not present

## 2019-12-07 DIAGNOSIS — J4521 Mild intermittent asthma with (acute) exacerbation: Secondary | ICD-10-CM | POA: Diagnosis not present

## 2019-12-07 DIAGNOSIS — I10 Essential (primary) hypertension: Secondary | ICD-10-CM | POA: Diagnosis not present

## 2019-12-07 MED ORDER — FUROSEMIDE 20 MG PO TABS: 20.0000 mg | ORAL_TABLET | ORAL | 6 refills | Status: DC

## 2019-12-07 MED ORDER — DAPAGLIFLOZIN PROPANEDIOL 10 MG PO TABS: 10.0000 mg | ORAL_TABLET | Freq: Every day | ORAL | 6 refills | Status: DC

## 2019-12-07 NOTE — Assessment & Plan Note (Signed)
-   I encouraged the patient to lose weight.  - I educated them on making healthy dietary choices including eating more fruits and vegetables and less fried foods. - I encouraged the patient to exercise more, and educated on the benefits of exercise including weight loss, diabetes management, and hypertension management.   

## 2019-12-07 NOTE — Assessment & Plan Note (Signed)
-   Today, the patient's blood pressure is well managed on losartan. - The patient will change the current treatment regimen.  - I encouraged the patient to eat a low-sodium diet to help control blood pressure. - I encouraged the patient to live an active lifestyle and complete activities that increases heart rate to 85% target heart rate at least 5 times per week for one hour.

## 2019-12-07 NOTE — Progress Notes (Signed)
Established Patient Office Visit  SUBJECTIVE:  Subjective  Patient ID: Vanessa Vazquez, female    DOB: [birth year]  Age: 85 y.o. MRN: [id number]  CC:  Chief Complaint  Patient presents with  . lab results    patient here today for labs dran on 11/23/19    HPI Vanessa Vazquez is a 85 y.o. female presenting today to discuss her recent lab results   The patient has lab work completed on 11/23/2019. Her CBC was normal except for RBC 3.54, Hgb 11.1, and HCT 34.2. CMP was normal. Glucose was abnormal at 133 fasting. Hgb A1C was 7.1%.   She takes Metformin and Januvia for her diabetes. She takes all of her medication as prescribed.   Past Medical History:  Diagnosis Date  . Asthma   . Diabetes mellitus without complication ([place])   . GERD (gastroesophageal reflux disease)   . Hypercholesteremia   . Hypertension     Past Surgical History:  Procedure Laterality Date  . CESAREAN SECTION    . COLONOSCOPY WITH PROPOFOL N/A 03/19/2016   Procedure: COLONOSCOPY WITH PROPOFOL;  Surgeon: Manya Silvas, MD;  [facility] ENDOSCOPY;  Service: Endoscopy;  Laterality: N/A;    Family History  Problem Relation Age of Onset  . Lung cancer Father   . Leukemia Sister   . Diabetes Sister   . Asthma Child     Social History   Socioeconomic History  . Marital status: Widowed    Spouse name: Not on file  . Number of children: Not on file  . Years of education: Not on file  . Highest education level: Not on file  Occupational History  . Not on file  Tobacco Use  . Smoking status: Former Research scientist (life sciences)  . Smokeless tobacco: Never Used  Substance and Sexual Activity  . Alcohol use: No    Alcohol/week: 0.0 standard drinks  . Drug use: No  . Sexual activity: Never  Other Topics Concern  . Not on file  Social History Narrative  . Not on file   Social Determinants of Health   Financial Resource Strain:   . Difficulty of Paying Living Expenses:   Food Insecurity:   . Worried About Paediatric nurse in the Last Year:   . Arboriculturist in the Last Year:   Transportation Needs:   . Film/video editor (Medical):   Marland Kitchen Lack of Transportation (Non-Medical):   Physical Activity:   . Days of Exercise per Week:   . Minutes of Exercise per Session:   Stress:   . Feeling of Stress :   Social Connections:   . Frequency of Communication with Friends and Family:   . Frequency of Social Gatherings with Friends and Family:   . Attends Religious Services:   . Active Member of Clubs or Organizations:   . Attends Archivist Meetings:   Marland Kitchen Marital Status:   Intimate Partner Violence:   . Fear of Current or Ex-Partner:   . Emotionally Abused:   Marland Kitchen Physically Abused:   . Sexually Abused:      Current Outpatient Medications:  .  albuterol (PROVENTIL HFA;VENTOLIN HFA) 108 (90 Base) MCG/ACT inhaler, Inhale 2 puffs into the lungs every 4 (four) hours as needed for wheezing or shortness of breath., Disp: 1 Inhaler, Rfl: 0 .  atenolol (TENORMIN) 25 MG tablet, TAKE 1 TABLET BY MOUTH ONCE DAILY, Disp: 30 tablet, Rfl: 6 .  atorvastatin (LIPITOR) 40 MG tablet, Take 40 mg  by mouth daily., Disp: , Rfl:  .  furosemide (LASIX) 20 MG tablet, Take 1 tablet (20 mg total) by mouth every other day., Disp: 30 tablet, Rfl: 6 .  glimepiride (AMARYL) 2 MG tablet, TAKE 1 TABLET BY MOUTH TWICE DAILY, Disp: 180 tablet, Rfl: 3 .  losartan-hydrochlorothiazide (HYZAAR) 100-25 MG tablet, Take 1 tablet by mouth daily., Disp: , Rfl:  .  metFORMIN (GLUCOPHAGE) 1000 MG tablet, Take 1,000 mg by mouth 2 (two) times daily. , Disp: , Rfl:  .  pregabalin (LYRICA) 50 MG capsule, TAKE 1 CAPSULE BY MOUTH TWICE DAILY (Patient taking differently: Take 50 mg by mouth 2 (two) times daily. ), Disp: 60 capsule, Rfl: 6 .  sitaGLIPtin (JANUVIA) 100 MG tablet, Take 100 mg by mouth daily., Disp: , Rfl:  .  dapagliflozin propanediol (FARXIGA) 10 MG TABS tablet, Take 1 tablet (10 mg total) by mouth daily., Disp: 30 tablet,  Rfl: 6   No Known Allergies  ROS Review of Systems  Constitutional: Negative.   HENT: Negative.   Eyes: Negative.   Respiratory: Negative.   Cardiovascular: Negative.   Gastrointestinal: Negative.   Endocrine: Negative.   Genitourinary: Negative.   Musculoskeletal: Negative.   Skin: Negative.   Allergic/Immunologic: Negative.   Neurological: Negative.   Hematological: Negative.   Psychiatric/Behavioral: Negative.   All other systems reviewed and are negative.    OBJECTIVE:    Physical Exam Vitals reviewed.  Constitutional:      Appearance: Normal appearance.  HENT:     Mouth/Throat:     Mouth: Mucous membranes are moist.  Eyes:     Pupils: Pupils are equal, round, and reactive to light.  Cardiovascular:     Rate and Rhythm: Normal rate and regular rhythm.     Pulses: Normal pulses.     Heart sounds: Normal heart sounds.  Pulmonary:     Effort: Pulmonary effort is normal.     Breath sounds: Normal breath sounds.  Abdominal:     Palpations: There is no hepatomegaly, splenomegaly or mass.     Tenderness: There is no abdominal tenderness.  Musculoskeletal:     Right lower leg: Edema (trace) present.     Left lower leg: Edema (trace) present.  Neurological:     Mental Status: She is alert and oriented to person, place, and time.  Psychiatric:        Mood and Affect: Mood and affect normal.        Behavior: Behavior normal.     BP 136/73   Pulse 74   Ht 5\' 4"  (1.626 m)   Wt 242 lb 1.6 oz (109.8 kg)   BMI 41.56 kg/m  Wt Readings from Last 3 Encounters:  12/07/19 242 lb 1.6 oz (109.8 kg)  11/23/19 243 lb (110.2 kg)  10/21/19 238 lb 9.6 oz (108.2 kg)    Health Maintenance Due  Topic Date Due  . FOOT EXAM  Never done  . OPHTHALMOLOGY EXAM  Never done  . COVID-19 Vaccine (1) Never done  . TETANUS/TDAP  Never done  . PNA vac Low Risk Adult (2 of 2 - PCV13) 04/11/2004    There are no preventive care reminders to display for this patient.  CBC Latest  Ref Rng & Units 11/23/2019 09/29/2019 09/17/2018  WBC 3.8 - 10.8 Thousand/uL 5.7 6.2 5.5  Hemoglobin 11.7 - 15.5 g/dL 11.1(L) 11.8(L) 13.1  Hematocrit 35 - 45 % 34.2(L) 36.0 41.3  Platelets 140 - 400 Thousand/uL 214 203 219   CMP  Latest Ref Rng & Units 11/23/2019 09/29/2019 09/17/2018  Glucose 65 - 99 mg/dL 133(H) 266(H) 167(H)  BUN 7 - 25 mg/dL 21 19 15   Creatinine 0.60 - 0.88 mg/dL 0.87 0.72 0.88  Sodium 135 - 146 mmol/L 142 138 141  Potassium 3.5 - 5.3 mmol/L 3.6 3.3(L) 3.8  Chloride 98 - 110 mmol/L 105 107 108  CO2 20 - 32 mmol/L 23 24 25   Calcium 8.6 - 10.4 mg/dL 9.6 9.6 9.6  Total Protein 6.1 - 8.1 g/dL 6.6 7.3 6.7  Total Bilirubin 0.2 - 1.2 mg/dL 0.8 1.0 0.7  Alkaline Phos 38 - 126 U/L - 91 73  AST 10 - 35 U/L 22 22 18   ALT 6 - 29 U/L 17 18 18     Lab Results  Component Value Date   TSH 0.56 11/23/2019   Lab Results  Component Value Date   ALBUMIN 3.8 09/29/2019   ANIONGAP 7 09/29/2019   No results found for: CHOL, HDL, LDLCALC, CHOLHDL No results found for: TRIG Lab Results  Component Value Date   HGBA1C 7.1 (H) 11/23/2019      ASSESSMENT & PLAN:   Problem List Items Addressed This Visit      Cardiovascular and Mediastinum   Hypertension    - Today, the patient's blood pressure is well managed on losartan. - The patient will change the current treatment regimen.  - I encouraged the patient to eat a low-sodium diet to help control blood pressure. - I encouraged the patient to live an active lifestyle and complete activities that increases heart rate to 85% target heart rate at least 5 times per week for one hour.          Relevant Medications   furosemide (LASIX) 20 MG tablet     Respiratory   Asthma    Mild stable at the present time she was not found to have any wheezing.  She was advised to lose weight.  She was also advised to walk on a daily basis.        Endocrine   Type 2 diabetes mellitus without complication, without long-term current use of  insulin ([place]) - Primary    Patient last hemoglobin AIC was 7.1.  Patient was started on Farxiga 10 mg p.o. daily.      Relevant Medications   dapagliflozin propanediol (FARXIGA) 10 MG TABS tablet     Other   Class 3 severe obesity due to excess calories with serious comorbidity and body mass index (BMI) of 40.0 to 44.9 in adult [facility])    - I encouraged the patient to lose weight.  - I educated them on making healthy dietary choices including eating more fruits and vegetables and less fried foods. - I encouraged the patient to exercise more, and educated on the benefits of exercise including weight loss, diabetes management, and hypertension management.       Relevant Medications   dapagliflozin propanediol (FARXIGA) 10 MG TABS tablet      Meds ordered this encounter  Medications  . furosemide (LASIX) 20 MG tablet    Sig: Take 1 tablet (20 mg total) by mouth every other day.    Dispense:  30 tablet    Refill:  6  . dapagliflozin propanediol (FARXIGA) 10 MG TABS tablet    Sig: Take 1 tablet (10 mg total) by mouth daily.    Dispense:  30 tablet    Refill:  6       Follow-up: Return in about 4 weeks (  around 01/04/2020).    Dr. Jane Canary [facility] [address], [place], [place] [address]   By signing my name below, I, [employer], attest that this documentation has been prepared under the direction and in the presence of Cletis Athens, MD. Electronically Signed: Cletis Athens, MD 12/07/19, 11:24 AM   I personally performed the services described in this documentation, which was SCRIBED in my presence. The recorded information has been reviewed and considered accurate. It has been edited as necessary during review. Cletis Athens, MD

## 2019-12-07 NOTE — Assessment & Plan Note (Signed)
Patient last hemoglobin AIC was 7.1.  Patient was started on Farxiga 10 mg p.o. daily.

## 2019-12-07 NOTE — Assessment & Plan Note (Signed)
Mild stable at the present time she was not found to have any wheezing.  She was advised to lose weight.  She was also advised to walk on a daily basis.

## 2019-12-21 DIAGNOSIS — M1712 Unilateral primary osteoarthritis, left knee: Secondary | ICD-10-CM | POA: Diagnosis not present

## 2020-01-04 DIAGNOSIS — M1712 Unilateral primary osteoarthritis, left knee: Secondary | ICD-10-CM | POA: Diagnosis not present

## 2020-01-04 DIAGNOSIS — M25562 Pain in left knee: Secondary | ICD-10-CM | POA: Diagnosis not present

## 2020-01-06 ENCOUNTER — Ambulatory Visit (INDEPENDENT_AMBULATORY_CARE_PROVIDER_SITE_OTHER): Payer: Medicare Other | Admitting: Internal Medicine

## 2020-01-06 ENCOUNTER — Other Ambulatory Visit: Payer: Self-pay

## 2020-01-06 ENCOUNTER — Encounter: Payer: Self-pay | Admitting: Internal Medicine

## 2020-01-06 VITALS — BP 133/66 | HR 70 | Ht 64.0 in | Wt 238.1 lb

## 2020-01-06 DIAGNOSIS — D508 Other iron deficiency anemias: Secondary | ICD-10-CM

## 2020-01-06 DIAGNOSIS — Z0000 Encounter for general adult medical examination without abnormal findings: Secondary | ICD-10-CM | POA: Diagnosis not present

## 2020-01-06 DIAGNOSIS — I10 Essential (primary) hypertension: Secondary | ICD-10-CM | POA: Diagnosis not present

## 2020-01-06 DIAGNOSIS — E119 Type 2 diabetes mellitus without complications: Secondary | ICD-10-CM | POA: Diagnosis not present

## 2020-01-06 DIAGNOSIS — E7800 Pure hypercholesterolemia, unspecified: Secondary | ICD-10-CM

## 2020-01-06 LAB — GLUCOSE, POCT (MANUAL RESULT ENTRY): POC Glucose: 133 mg/dl — AB (ref 70–99)

## 2020-01-06 MED ORDER — FERROUS SULFATE 300 (60 FE) MG/5ML PO SYRP: 300.0000 mg | ORAL_SOLUTION | Freq: Every day | ORAL | 3 refills | Status: DC

## 2020-01-06 NOTE — Progress Notes (Signed)
Established Patient Office Visit  SUBJECTIVE:  Subjective  Patient ID: Vanessa Vazquez, female    DOB: [birth year]  Age: 85 y.o. MRN: [id number]  CC:  Chief Complaint  Patient presents with  . Annual Exam    HPI Vanessa Vazquez is a 85 y.o. female presenting today for her annual exam.   She last received a mammogram on 11/04/2014. Her last colonoscopy was on 03/19/2016 and they found two polyps.  She last saw her optometrist on 05/2019.   She notes that she has some fatigue with associated weakness on occasion. She was mildly anemic on her recent blood work, which may explain this.   She is currently receiving physical therapy for her left knee; she started on 01/04/2020.   She is not vaccinated against COVID19 and she is not interested in getting the vaccine.    Past Medical History:  Diagnosis Date  . Asthma   . Diabetes mellitus without complication ([place])   . GERD (gastroesophageal reflux disease)   . Hypercholesteremia   . Hypertension     Past Surgical History:  Procedure Laterality Date  . CESAREAN SECTION    . COLONOSCOPY WITH PROPOFOL N/A 03/19/2016   Procedure: COLONOSCOPY WITH PROPOFOL;  Surgeon: Manya Silvas, MD;  Location: [facility] ENDOSCOPY;  Service: Endoscopy;  Laterality: N/A;    Family History  Problem Relation Age of Onset  . Lung cancer Father   . Leukemia Sister   . Diabetes Sister   . Asthma Child     Social History   Socioeconomic History  . Marital status: Widowed    Spouse name: Not on file  . Number of children: Not on file  . Years of education: Not on file  . Highest education level: Not on file  Occupational History  . Not on file  Tobacco Use  . Smoking status: Former Research scientist (life sciences)  . Smokeless tobacco: Never Used  Substance and Sexual Activity  . Alcohol use: No    Alcohol/week: 0.0 standard drinks  . Drug use: No  . Sexual activity: Never  Other Topics Concern  . Not on file  Social History Narrative  . Not on file   Social  Determinants of Health   Financial Resource Strain:   . Difficulty of Paying Living Expenses:   Food Insecurity:   . Worried About Charity fundraiser in the Last Year:   . Arboriculturist in the Last Year:   Transportation Needs:   . Film/video editor (Medical):   Marland Kitchen Lack of Transportation (Non-Medical):   Physical Activity:   . Days of Exercise per Week:   . Minutes of Exercise per Session:   Stress:   . Feeling of Stress :   Social Connections:   . Frequency of Communication with Friends and Family:   . Frequency of Social Gatherings with Friends and Family:   . Attends Religious Services:   . Active Member of Clubs or Organizations:   . Attends Archivist Meetings:   Marland Kitchen Marital Status:   Intimate Partner Violence:   . Fear of Current or Ex-Partner:   . Emotionally Abused:   Marland Kitchen Physically Abused:   . Sexually Abused:      Current Outpatient Medications:  .  albuterol (PROVENTIL HFA;VENTOLIN HFA) 108 (90 Base) MCG/ACT inhaler, Inhale 2 puffs into the lungs every 4 (four) hours as needed for wheezing or shortness of breath., Disp: 1 Inhaler, Rfl: 0 .  atenolol (TENORMIN) 25 MG tablet,  TAKE 1 TABLET BY MOUTH ONCE DAILY, Disp: 30 tablet, Rfl: 6 .  atorvastatin (LIPITOR) 40 MG tablet, Take 40 mg by mouth daily., Disp: , Rfl:  .  dapagliflozin propanediol (FARXIGA) 10 MG TABS tablet, Take 1 tablet (10 mg total) by mouth daily., Disp: 30 tablet, Rfl: 6 .  furosemide (LASIX) 20 MG tablet, Take 1 tablet (20 mg total) by mouth every other day., Disp: 30 tablet, Rfl: 6 .  glimepiride (AMARYL) 2 MG tablet, TAKE 1 TABLET BY MOUTH TWICE DAILY, Disp: 180 tablet, Rfl: 3 .  losartan-hydrochlorothiazide (HYZAAR) 100-25 MG tablet, Take 1 tablet by mouth daily., Disp: , Rfl:  .  metFORMIN (GLUCOPHAGE) 1000 MG tablet, Take 1,000 mg by mouth 2 (two) times daily. , Disp: , Rfl:  .  pregabalin (LYRICA) 50 MG capsule, TAKE 1 CAPSULE BY MOUTH TWICE DAILY (Patient taking differently: Take  50 mg by mouth 2 (two) times daily. ), Disp: 60 capsule, Rfl: 6 .  sitaGLIPtin (JANUVIA) 100 MG tablet, Take 100 mg by mouth daily., Disp: , Rfl:    No Known Allergies  ROS Review of Systems  Constitutional: Positive for fatigue.  HENT: Negative.   Eyes: Negative.   Respiratory: Negative.  Negative for shortness of breath.   Cardiovascular: Negative.  Negative for chest pain.  Gastrointestinal: Negative.   Endocrine: Negative.   Genitourinary: Negative.   Musculoskeletal: Positive for myalgias.  Skin: Negative.   Allergic/Immunologic: Negative.   Neurological: Positive for weakness.  Hematological: Negative.   Psychiatric/Behavioral: Negative.   All other systems reviewed and are negative.    OBJECTIVE:    Physical Exam Vitals reviewed.  Constitutional:      Appearance: Normal appearance.  HENT:     Mouth/Throat:     Mouth: Mucous membranes are moist.  Eyes:     Pupils: Pupils are equal, round, and reactive to light.  Neck:     Vascular: No carotid bruit.  Cardiovascular:     Rate and Rhythm: Normal rate and regular rhythm.     Pulses: Normal pulses.     Heart sounds: Normal heart sounds.  Pulmonary:     Effort: Pulmonary effort is normal.     Breath sounds: Normal breath sounds.  Abdominal:     General: Bowel sounds are normal.     Palpations: Abdomen is soft. There is no hepatomegaly, splenomegaly or mass.     Tenderness: There is no abdominal tenderness.     Hernia: No hernia is present.  Musculoskeletal:     Cervical back: Neck supple.     Left knee: Tenderness present.     Right lower leg: No edema.     Left lower leg: No edema.     Left ankle: Tenderness present.  Skin:    Findings: No rash.  Neurological:     Mental Status: She is alert and oriented to person, place, and time.     Motor: No weakness.  Psychiatric:        Mood and Affect: Mood and affect normal.        Behavior: Behavior normal.     BP 133/66   Pulse 70   Ht 5\' 4"  (1.626 m)    Wt 238 lb 1.6 oz (108 kg)   BMI 40.87 kg/m  Wt Readings from Last 3 Encounters:  01/06/20 238 lb 1.6 oz (108 kg)  12/07/19 242 lb 1.6 oz (109.8 kg)  11/23/19 243 lb (110.2 kg)    Health Maintenance Due  Topic Date Due  .  FOOT EXAM  Never done  . OPHTHALMOLOGY EXAM  Never done  . COVID-19 Vaccine (1) Never done  . TETANUS/TDAP  Never done  . PNA vac Low Risk Adult (2 of 2 - PCV13) 04/11/2004  . INFLUENZA VACCINE  12/27/2019    There are no preventive care reminders to display for this patient.  CBC Latest Ref Rng & Units 11/23/2019 09/29/2019 09/17/2018  WBC 3.8 - 10.8 Thousand/uL 5.7 6.2 5.5  Hemoglobin 11.7 - 15.5 g/dL 11.1(L) 11.8(L) 13.1  Hematocrit 35 - 45 % 34.2(L) 36.0 41.3  Platelets 140 - 400 Thousand/uL 214 203 219   CMP Latest Ref Rng & Units 11/23/2019 09/29/2019 09/17/2018  Glucose 65 - 99 mg/dL 133(H) 266(H) 167(H)  BUN 7 - 25 mg/dL 21 19 15   Creatinine 0.60 - 0.88 mg/dL 0.87 0.72 0.88  Sodium 135 - 146 mmol/L 142 138 141  Potassium 3.5 - 5.3 mmol/L 3.6 3.3(L) 3.8  Chloride 98 - 110 mmol/L 105 107 108  CO2 20 - 32 mmol/L 23 24 25   Calcium 8.6 - 10.4 mg/dL 9.6 9.6 9.6  Total Protein 6.1 - 8.1 g/dL 6.6 7.3 6.7  Total Bilirubin 0.2 - 1.2 mg/dL 0.8 1.0 0.7  Alkaline Phos 38 - 126 U/L - 91 73  AST 10 - 35 U/L 22 22 18   ALT 6 - 29 U/L 17 18 18     Lab Results  Component Value Date   TSH 0.56 11/23/2019   Lab Results  Component Value Date   ALBUMIN 3.8 09/29/2019   ANIONGAP 7 09/29/2019   No results found for: CHOL, HDL, LDLCALC, CHOLHDL No results found for: TRIG Lab Results  Component Value Date   HGBA1C 7.1 (H) 11/23/2019      ASSESSMENT & PLAN:   Problem List Items Addressed This Visit      Cardiovascular and Mediastinum   Essential hypertension    - Today, the patient's blood pressure is well managed on present  med. - The patient will continue the current treatment regimen.  - I encouraged the patient to eat a low-sodium diet to help control  blood pressure. - I encouraged the patient to live an active lifestyle and complete activities that increases heart rate to 85% target heart rate at least 5 times per week for one hour.            Endocrine   Type 2 diabetes mellitus without complication, without long-term current use of insulin ([place])    - The patient's blood sugar is under control on glimpride. - The patient will continue the current treatment regimen.  - I encouraged the patient to regularly check blood sugar.  - I encouraged the patient to monitor diet. I encouraged the patient to eat low-carb and low-sugar to help prevent blood sugar spikes.  - I encouraged the patient to continue following their prescribed treatment plan for diabetes - I informed the patient to get help if blood sugar drops below 54mg /dL, or if suddenly have trouble thinking clearly or breathing.         Relevant Orders   POCT glucose (manual entry) (Completed)     Other   Hypercholesteremia    onlipitotor  40 mg po daily      Iron deficiency anemia secondary to inadequate dietary iron intake - Primary    Pt is border line aneamic  She was advised  To take feosol  65 mg  daily      Annual physical exam    dont  want  Mammogram or colon exam         No orders of the defined types were placed in this encounter.   Follow-up: Return in about 3 months (around 04/07/2020).    Dr. Jane Canary [facility] [address] [place], [place] [address]   By signing my name below, I, [employer], attest that this documentation has been prepared under the direction and in the presence of Cletis Athens, MD. Electronically Signed: Cletis Athens, MD 01/06/20, 10:15 AM   I personally performed the services described in this documentation, which was SCRIBED in my presence. The recorded information has been reviewed and considered accurate. It has been edited as necessary during review. Cletis Athens, MD

## 2020-01-06 NOTE — Assessment & Plan Note (Signed)
dont want  Mammogram or colon exam

## 2020-01-06 NOTE — Assessment & Plan Note (Signed)
-   Today, the patient's blood pressure is well managed on present med. - The patient will continue the current treatment regimen.  - I encouraged the patient to eat a low-sodium diet to help control blood pressure. - I encouraged the patient to live an active lifestyle and complete activities that increases heart rate to 85% target heart rate at least 5 times per week for one hour.     

## 2020-01-06 NOTE — Assessment & Plan Note (Signed)
onlipitotor  40 mg po daily

## 2020-01-06 NOTE — Assessment & Plan Note (Signed)
Pt is border line aneamic  She was advised  To take feosol  65 mg  daily

## 2020-01-06 NOTE — Patient Instructions (Addendum)
To schedule routine mammography:  [facility] [address] [place], [place] [address] [place] [facility] [place], [place] [address] [place] [facility] [address] [place], [place] [address] [place] at [facility] [address] [place], [place] [address] [place] [facility] [place], [place] [address] [phone]  [facility] [place] [facility] [place], [place] [address] [phone]     Breast Self-Awareness Breast self-awareness is knowing how your breasts look and feel. Doing breast self-awareness is important. It allows you to catch a breast problem early while it is still small and can be treated. All women should do breast self-awareness, including women who have had breast implants. Tell your doctor if you notice a change in your breasts. What you need:  A mirror.  A well-lit room. How to do a breast self-exam A breast self-exam is one way to learn what is normal for your breasts and to check for changes. To do a breast self-exam: Look for changes  1. Take off all the clothes above your waist. 2. Stand in front of a mirror in a room with good lighting. 3. Put your hands on your hips. 4. Push your hands down. 5. Look at your breasts and nipples in the mirror to see if one breast or nipple looks different from the other. Check to see if: ? The shape of one breast is different. ? The size of one breast is different. ? There are wrinkles, dips, and bumps in one breast and not the other. 6. Look at each breast for changes in the skin, such as: ? Redness. ? Scaly areas. 7. Look for changes in your nipples, such as: ? Liquid around the nipples. ? Bleeding. ? Dimpling. ? Redness. ? A change in  where the nipples are. Feel for changes  1. Lie on your back on the floor. 2. Feel each breast. To do this, follow these steps: ? Pick a breast to feel. ? Put the arm closest to that breast above your head. ? Use your other arm to feel the nipple area of your breast. Feel the area with the pads of your three middle fingers by making small circles with your fingers. For the first circle, press lightly. For the second circle, press harder. For the third circle, press even harder. ? Keep making circles with your fingers at the different pressures as you move down your breast. Stop when you feel your ribs. ? Move your fingers a little toward the center of your body. ? Start making circles with your fingers again, this time going up until you reach your collarbone. ? Keep making up-and-down circles until you reach your armpit. Remember to keep using the three pressures. ? Feel the other breast in the same way. 3. Sit or stand in the tub or shower. 4. With soapy water on your skin, feel each breast the same way you did in step 2 when you were lying on the floor.  Write down what you find Writing down what you find can help you remember what to tell your doctor. Write down: 1. What is normal for each breast. 2. Any changes you find in each breast, including: ? The kind of changes you find. ? Whether you have pain. ? Size and  location of any lumps. 3. When you last had your menstrual period.  General tips  Check your breasts every month.  If you are breastfeeding, the best time to check your breasts is after you feed your baby or after you use a breast pump.  If you get menstrual periods, the best time to check your breasts is 5-7 days after your menstrual period is over.  With time, you will become comfortable with the self-exam, and you will begin to know if there are changes in your breasts.  Contact a doctor if you:  See a change in the shape or size of your breasts or  nipples.  See a change in the skin of your breast or nipples, such as red or scaly skin.  Have fluid coming from your nipples that is not normal.  Find a lump or thick area that was not there before.  Have pain in your breasts.  Have any concerns about your breast health.  Summary  Breast self-awareness includes looking for changes in your breasts, as well as feeling for changes within your breasts.  Breast self-awareness should be done in front of a mirror in a well-lit room.  You should check your breasts every month. If you get menstrual periods, the best time to check your breasts is 5-7 days after your menstrual period is over.  Let your doctor know of any changes you see in your breasts, including changes in size, changes on the skin, pain or tenderness, or fluid from your nipples that is not normal.  This information is not intended to replace advice given to you by your health care provider. Make sure you discuss any questions you have with your health care provider.  Document Revised: 12/31/2017 Document Reviewed: 12/31/2017 Elsevier Patient Education  [place].

## 2020-01-06 NOTE — Assessment & Plan Note (Signed)
-   The patient's blood sugar is under control on glimpride. - The patient will continue the current treatment regimen.  - I encouraged the patient to regularly check blood sugar.  - I encouraged the patient to monitor diet. I encouraged the patient to eat low-carb and low-sugar to help prevent blood sugar spikes.  - I encouraged the patient to continue following their prescribed treatment plan for diabetes - I informed the patient to get help if blood sugar drops below 54mg /dL, or if suddenly have trouble thinking clearly or breathing.

## 2020-01-07 ENCOUNTER — Encounter: Payer: Medicare Other | Admitting: Internal Medicine

## 2020-01-26 DIAGNOSIS — M1712 Unilateral primary osteoarthritis, left knee: Secondary | ICD-10-CM | POA: Diagnosis not present

## 2020-01-28 DIAGNOSIS — M1712 Unilateral primary osteoarthritis, left knee: Secondary | ICD-10-CM | POA: Diagnosis not present

## 2020-01-28 DIAGNOSIS — M25562 Pain in left knee: Secondary | ICD-10-CM | POA: Diagnosis not present

## 2020-02-02 DIAGNOSIS — Z20822 Contact with and (suspected) exposure to covid-19: Secondary | ICD-10-CM | POA: Diagnosis not present

## 2020-02-05 DIAGNOSIS — M1712 Unilateral primary osteoarthritis, left knee: Secondary | ICD-10-CM | POA: Diagnosis not present

## 2020-02-05 DIAGNOSIS — M25562 Pain in left knee: Secondary | ICD-10-CM | POA: Diagnosis not present

## 2020-02-11 ENCOUNTER — Ambulatory Visit (INDEPENDENT_AMBULATORY_CARE_PROVIDER_SITE_OTHER): Payer: Medicare Other | Admitting: Internal Medicine

## 2020-02-11 ENCOUNTER — Other Ambulatory Visit: Payer: Self-pay

## 2020-02-11 ENCOUNTER — Encounter: Payer: Self-pay | Admitting: Internal Medicine

## 2020-02-11 VITALS — BP 146/75 | HR 62 | Ht 65.0 in | Wt 235.6 lb

## 2020-02-11 DIAGNOSIS — R5383 Other fatigue: Secondary | ICD-10-CM | POA: Diagnosis not present

## 2020-02-11 DIAGNOSIS — D508 Other iron deficiency anemias: Secondary | ICD-10-CM | POA: Diagnosis not present

## 2020-02-11 DIAGNOSIS — Z0000 Encounter for general adult medical examination without abnormal findings: Secondary | ICD-10-CM

## 2020-02-11 DIAGNOSIS — I10 Essential (primary) hypertension: Secondary | ICD-10-CM

## 2020-02-11 DIAGNOSIS — Z6841 Body Mass Index (BMI) 40.0 and over, adult: Secondary | ICD-10-CM

## 2020-02-11 DIAGNOSIS — E119 Type 2 diabetes mellitus without complications: Secondary | ICD-10-CM

## 2020-02-11 DIAGNOSIS — J301 Allergic rhinitis due to pollen: Secondary | ICD-10-CM

## 2020-02-11 MED ORDER — FERROUS SULFATE 325 (65 FE) MG PO TABS: 325.0000 mg | ORAL_TABLET | Freq: Two times a day (BID) | ORAL | Status: AC

## 2020-02-11 NOTE — Assessment & Plan Note (Signed)
Pt's main problem on physical is exogenous obesity, tiredness, fatigue, uncontrolled BP. She has a slight unsteady gait and uses a cain to walk. She feels tired and fatigued due to anemia and we've increased her iron supplementation.

## 2020-02-11 NOTE — Assessment & Plan Note (Signed)
-   Today, the patient's blood pressure is not well managed on losartan HCTZ. - The patient will continue the current treatment regimen.  - She was advised to take her medication regularly.  - I encouraged the patient to eat a low-sodium diet to help control blood pressure. - I encouraged the patient to live an active lifestyle and complete activities that increases heart rate to 85% target heart rate at least 5 times per week for one hour.

## 2020-02-11 NOTE — Assessment & Plan Note (Signed)
Pt's Hgb is low. I changed her iron pill to ferrous sulfate 325 BID. She is not very keen on getting colonoscopy or mammography. I encouraged her not to take liquid iron as it could stain her teeth.

## 2020-02-11 NOTE — Progress Notes (Signed)
Established Patient Office Visit  SUBJECTIVE:  Subjective  Patient ID: Vanessa Vazquez, female    DOB: March 21, 1935  Age: 85 y.o. MRN: [id number]  CC:  Chief Complaint  Patient presents with  . Fatigue    HPI Vanessa Vazquez is a 85 y.o. female presenting today for an evaluation of her fatigue.  She notes that she has been excessively fatigued; she states that she feels weak and hasn't been sleeping well. She also notes an occasional chest pain right in the center of her chest.  She drinks Pedialyte to help with her energy levels when she feels down.   Her Hgb is 11.1 on 11/23/2019. She has been taking oral iron; ferrous sulfate 300 (60 Fe) MG/5ML syrup. She had a colonoscopy on 03/19/2016.   Past Medical History:  Diagnosis Date  . Asthma   . Diabetes mellitus without complication ([place])   . GERD (gastroesophageal reflux disease)   . Hypercholesteremia   . Hypertension     Past Surgical History:  Procedure Laterality Date  . CESAREAN SECTION    . COLONOSCOPY WITH PROPOFOL N/A 03/19/2016   Procedure: COLONOSCOPY WITH PROPOFOL;  Surgeon: Manya Silvas, MD;  Location: [facility];  Service: Endoscopy;  Laterality: N/A;    Family History  Problem Relation Age of Onset  . Lung cancer Father   . Leukemia Sister   . Diabetes Sister   . Asthma Child     Social History   Socioeconomic History  . Marital status: Widowed    Spouse name: Not on file  . Number of children: Not on file  . Years of education: Not on file  . Highest education level: Not on file  Occupational History  . Not on file  Tobacco Use  . Smoking status: Former Research scientist (life sciences)  . Smokeless tobacco: Never Used  Substance and Sexual Activity  . Alcohol use: No    Alcohol/week: 0.0 standard drinks  . Drug use: No  . Sexual activity: Never  Other Topics Concern  . Not on file  Social History Narrative  . Not on file   Social Determinants of Health   Financial Resource Strain:   . Difficulty of Paying  Living Expenses: Not on file  Food Insecurity:   . Worried About Charity fundraiser in the Last Year: Not on file  . Ran Out of Food in the Last Year: Not on file  Transportation Needs:   . Lack of Transportation (Medical): Not on file  . Lack of Transportation (Non-Medical): Not on file  Physical Activity:   . Days of Exercise per Week: Not on file  . Minutes of Exercise per Session: Not on file  Stress:   . Feeling of Stress : Not on file  Social Connections:   . Frequency of Communication with Friends and Family: Not on file  . Frequency of Social Gatherings with Friends and Family: Not on file  . Attends Religious Services: Not on file  . Active Member of Clubs or Organizations: Not on file  . Attends Archivist Meetings: Not on file  . Marital Status: Not on file  Intimate Partner Violence:   . Fear of Current or Ex-Partner: Not on file  . Emotionally Abused: Not on file  . Physically Abused: Not on file  . Sexually Abused: Not on file     Current Outpatient Medications:  .  albuterol (PROVENTIL HFA;VENTOLIN HFA) 108 (90 Base) MCG/ACT inhaler, Inhale 2 puffs into the lungs every  4 (four) hours as needed for wheezing or shortness of breath., Disp: 1 Inhaler, Rfl: 0 .  atenolol (TENORMIN) 25 MG tablet, TAKE 1 TABLET BY MOUTH ONCE DAILY, Disp: 30 tablet, Rfl: 6 .  atorvastatin (LIPITOR) 40 MG tablet, Take 40 mg by mouth daily., Disp: , Rfl:  .  furosemide (LASIX) 20 MG tablet, Take 1 tablet (20 mg total) by mouth every other day., Disp: 30 tablet, Rfl: 6 .  glimepiride (AMARYL) 2 MG tablet, TAKE 1 TABLET BY MOUTH TWICE DAILY, Disp: 180 tablet, Rfl: 3 .  losartan-hydrochlorothiazide (HYZAAR) 100-25 MG tablet, Take 1 tablet by mouth daily., Disp: , Rfl:  .  metFORMIN (GLUCOPHAGE) 1000 MG tablet, Take 1,000 mg by mouth 2 (two) times daily. , Disp: , Rfl:  .  pregabalin (LYRICA) 50 MG capsule, TAKE 1 CAPSULE BY MOUTH TWICE DAILY (Patient taking differently: Take 50 mg by  mouth 2 (two) times daily. ), Disp: 60 capsule, Rfl: 6 .  sitaGLIPtin (JANUVIA) 100 MG tablet, Take 100 mg by mouth daily., Disp: , Rfl:  .  ferrous sulfate 300 (60 Fe) MG/5ML syrup, Take 5 mLs (300 mg total) by mouth daily., Disp: 150 mL, Rfl: 3  Current Facility-Administered Medications:  .  [START ON 02/12/2020] ferrous sulfate tablet 325 mg, 325 mg, Oral, BID WC, Roel Douthat, MD   No Known Allergies  ROS Review of Systems  Constitutional: Positive for fatigue. Negative for chills and fever.  HENT: Negative.   Eyes: Negative.   Respiratory: Negative.  Negative for shortness of breath.   Cardiovascular: Positive for chest pain (sternal, occasional).  Gastrointestinal: Negative.  Negative for abdominal pain, anal bleeding, blood in stool, constipation, diarrhea and nausea.  Endocrine: Negative.   Genitourinary: Positive for frequency (Fluid pill).  Musculoskeletal: Negative.   Skin: Negative.   Allergic/Immunologic: Negative.   Neurological: Positive for weakness.  Hematological: Negative.   Psychiatric/Behavioral: Positive for sleep disturbance.  All other systems reviewed and are negative.    OBJECTIVE:    Physical Exam Vitals reviewed.  Constitutional:      Appearance: Normal appearance.  HENT:     Mouth/Throat:     Mouth: Mucous membranes are moist.  Eyes:     Pupils: Pupils are equal, round, and reactive to light.  Neck:     Vascular: No carotid bruit.  Cardiovascular:     Rate and Rhythm: Normal rate and regular rhythm.     Pulses: Normal pulses.     Heart sounds: Normal heart sounds.  Pulmonary:     Effort: Pulmonary effort is normal.     Breath sounds: Normal breath sounds.  Abdominal:     General: Bowel sounds are normal.     Palpations: Abdomen is soft. There is no hepatomegaly, splenomegaly or mass.     Tenderness: There is no abdominal tenderness.     Hernia: No hernia is present.  Musculoskeletal:        General: No tenderness.     Cervical back:  Neck supple.     Right lower leg: No edema.     Left lower leg: No edema.  Skin:    Findings: No rash.  Neurological:     Mental Status: She is alert and oriented to person, place, and time.     Motor: No weakness.  Psychiatric:        Mood and Affect: Mood and affect normal.        Behavior: Behavior normal.     BP (!) 146/75  Pulse 62   Ht 5\' 5"  (1.651 m)   Wt 235 lb 9.6 oz (106.9 kg)   BMI 39.21 kg/m  Wt Readings from Last 3 Encounters:  02/11/20 235 lb 9.6 oz (106.9 kg)  01/06/20 238 lb 1.6 oz (108 kg)  12/07/19 242 lb 1.6 oz (109.8 kg)    Health Maintenance Due  Topic Date Due  . FOOT EXAM  Never done  . OPHTHALMOLOGY EXAM  Never done  . COVID-19 Vaccine (1) Never done  . TETANUS/TDAP  Never done  . PNA vac Low Risk Adult (2 of 2 - PCV13) 04/11/2004  . INFLUENZA VACCINE  12/27/2019    There are no preventive care reminders to display for this patient.  CBC Latest Ref Rng & Units 11/23/2019 09/29/2019 09/17/2018  WBC 3.8 - 10.8 Thousand/uL 5.7 6.2 5.5  Hemoglobin 11.7 - 15.5 g/dL 11.1(L) 11.8(L) 13.1  Hematocrit 35 - 45 % 34.2(L) 36.0 41.3  Platelets 140 - 400 Thousand/uL 214 203 219   CMP Latest Ref Rng & Units 11/23/2019 09/29/2019 09/17/2018  Glucose 65 - 99 mg/dL 133(H) 266(H) 167(H)  BUN 7 - 25 mg/dL 21 19 15   Creatinine 0.60 - 0.88 mg/dL 0.87 0.72 0.88  Sodium 135 - 146 mmol/L 142 138 141  Potassium 3.5 - 5.3 mmol/L 3.6 3.3(L) 3.8  Chloride 98 - 110 mmol/L 105 107 108  CO2 20 - 32 mmol/L 23 24 25   Calcium 8.6 - 10.4 mg/dL 9.6 9.6 9.6  Total Protein 6.1 - 8.1 g/dL 6.6 7.3 6.7  Total Bilirubin 0.2 - 1.2 mg/dL 0.8 1.0 0.7  Alkaline Phos 38 - 126 U/L - 91 73  AST 10 - 35 U/L 22 22 18   ALT 6 - 29 U/L 17 18 18     Lab Results  Component Value Date   TSH 0.56 11/23/2019   Lab Results  Component Value Date   ALBUMIN 3.8 09/29/2019   ANIONGAP 7 09/29/2019   No results found for: CHOL, HDL, LDLCALC, CHOLHDL No results found for: TRIG Lab Results    Component Value Date   HGBA1C 7.1 (H) 11/23/2019      ASSESSMENT & PLAN:   Problem List Items Addressed This Visit      Cardiovascular and Mediastinum   Essential hypertension    - Today, the patient's blood pressure is not well managed on losartan HCTZ. - The patient will continue the current treatment regimen.  - She was advised to take her medication regularly.  - I encouraged the patient to eat a low-sodium diet to help control blood pressure. - I encouraged the patient to live an active lifestyle and complete activities that increases heart rate to 85% target heart rate at least 5 times per week for one hour.         Respiratory   Seasonal allergic rhinitis due to pollen    She has allergic rhinitis. I encouraged her to take an OTC allergy relief medication such as Claritin as needed. I encouraged her to limit her outdoor activities when the pollen count is high.         Endocrine   Type 2 diabetes mellitus without complication, without long-term current use of insulin ([place])    - The patient's blood sugar is under control on metformin and januvia. - The patient will continue the current treatment regimen.  - I encouraged the patient to regularly check blood sugar.  - I encouraged the patient to monitor diet. I encouraged the patient to eat low-carb  and low-sugar to help prevent blood sugar spikes.  - I encouraged the patient to continue following their prescribed treatment plan for diabetes - I informed the patient to get help if blood sugar drops below 54mg /dL, or if suddenly have trouble thinking clearly or breathing.         Other   Class 3 severe obesity due to excess calories with serious comorbidity and body mass index (BMI) of 40.0 to 44.9 in adult [facility])    - I encouraged the patient to lose weight.  - I educated them on making healthy dietary choices including eating more fruits and vegetables and less fried foods. - I encouraged the patient to exercise more,  and educated on the benefits of exercise including weight loss, diabetes management, and hypertension management.        Iron deficiency anemia secondary to inadequate dietary iron intake    Pt's Hgb is low. I changed her iron pill to ferrous sulfate 325 BID. She is not very keen on getting colonoscopy or mammography. I encouraged her not to take liquid iron as it could stain her teeth.       Relevant Medications   ferrous sulfate tablet 325 mg (Start on 02/12/2020  8:00 AM)   Annual physical exam    Pt's main problem on physical is exogenous obesity, tiredness, fatigue, uncontrolled BP. She has a slight unsteady gait and uses a cain to walk. She feels tired and fatigued due to anemia and we've increased her iron supplementation.       Fatigue - Primary    Secondary to anemia.       Relevant Medications   ferrous sulfate tablet 325 mg (Start on 02/12/2020  8:00 AM)      Meds ordered this encounter  Medications  . ferrous sulfate tablet 325 mg    Follow-up: No follow-ups on file.    Cletis Athens, MD [facility] [address] [place], [place] [address]   By signing my name below, I, [employer], attest that this documentation has been prepared under the direction and in the presence of Dr. Cletis Athens Electronically Signed: Cletis Athens, MD 02/11/20, 12:30 PM  I personally performed the services described in this documentation, which was SCRIBED in my presence. The recorded information has been reviewed and considered accurate. It has been edited as necessary during review. Cletis Athens, MD

## 2020-02-11 NOTE — Assessment & Plan Note (Signed)
Secondary to anemia.

## 2020-02-11 NOTE — Assessment & Plan Note (Signed)
She has allergic rhinitis. I encouraged her to take an OTC allergy relief medication such as Claritin as needed. I encouraged her to limit her outdoor activities when the pollen count is high.

## 2020-02-11 NOTE — Assessment & Plan Note (Signed)
-   I encouraged the patient to lose weight.  - I educated them on making healthy dietary choices including eating more fruits and vegetables and less fried foods. - I encouraged the patient to exercise more, and educated on the benefits of exercise including weight loss, diabetes management, and hypertension management.   

## 2020-02-11 NOTE — Assessment & Plan Note (Signed)
-   The patient's blood sugar is under control on metformin and januvia. - The patient will continue the current treatment regimen.  - I encouraged the patient to regularly check blood sugar.  - I encouraged the patient to monitor diet. I encouraged the patient to eat low-carb and low-sugar to help prevent blood sugar spikes.  - I encouraged the patient to continue following their prescribed treatment plan for diabetes - I informed the patient to get help if blood sugar drops below 54mg /dL, or if suddenly have trouble thinking clearly or breathing.

## 2020-02-17 ENCOUNTER — Other Ambulatory Visit: Payer: Self-pay

## 2020-02-17 MED ORDER — ATENOLOL 25 MG PO TABS: 25.0000 mg | ORAL_TABLET | Freq: Every day | ORAL | 6 refills | Status: DC

## 2020-04-04 ENCOUNTER — Other Ambulatory Visit: Payer: Self-pay | Admitting: *Deleted

## 2020-04-04 MED ORDER — SITAGLIPTIN PHOSPHATE 100 MG PO TABS: 100.0000 mg | ORAL_TABLET | Freq: Every day | ORAL | 3 refills | Status: DC

## 2020-04-06 ENCOUNTER — Ambulatory Visit (INDEPENDENT_AMBULATORY_CARE_PROVIDER_SITE_OTHER): Payer: Medicare Other | Admitting: Family Medicine

## 2020-04-06 ENCOUNTER — Other Ambulatory Visit: Payer: Self-pay

## 2020-04-06 ENCOUNTER — Encounter: Payer: Self-pay | Admitting: Family Medicine

## 2020-04-06 VITALS — BP 144/73 | HR 68 | Ht 63.0 in | Wt 235.8 lb

## 2020-04-06 DIAGNOSIS — E7800 Pure hypercholesterolemia, unspecified: Secondary | ICD-10-CM

## 2020-04-06 DIAGNOSIS — I10 Essential (primary) hypertension: Secondary | ICD-10-CM | POA: Diagnosis not present

## 2020-04-06 DIAGNOSIS — E119 Type 2 diabetes mellitus without complications: Secondary | ICD-10-CM | POA: Diagnosis not present

## 2020-04-06 LAB — GLUCOSE, POCT (MANUAL RESULT ENTRY): POC Glucose: 116 mg/dl — AB (ref 70–99)

## 2020-04-06 NOTE — Assessment & Plan Note (Signed)
Patient taking Lipitor, no s/e. Will draw lipid panel next blood draw.

## 2020-04-06 NOTE — Assessment & Plan Note (Signed)
Most recent A1C wnl at 7.1, GFR 72 so DM well controlled.

## 2020-04-06 NOTE — Assessment & Plan Note (Signed)
Patient's blood pressure is within the desired range. Medication side effects include: no side effects noted Continue current treatment regimen. Continue current medications.

## 2020-04-06 NOTE — Progress Notes (Signed)
Established Patient Office Visit  SUBJECTIVE:  Subjective  Patient ID: Vanessa Vazquez, female    DOB: 1935-02-06  Age: 85 y.o. MRN: [id number]  CC:  Chief Complaint  Patient presents with   Diabetes    3 month follow up   Hypertension    3 month follow up    HPI Vanessa Vazquez is a 85 y.o. female presenting today for DM follow up with HTn doing well, taking all meds as rx.   Past Medical History:  Diagnosis Date   Asthma    Diabetes mellitus without complication ([place])    GERD (gastroesophageal reflux disease)    Hypercholesteremia    Hypertension     Past Surgical History:  Procedure Laterality Date   CESAREAN SECTION     COLONOSCOPY WITH PROPOFOL N/A 03/19/2016   Procedure: COLONOSCOPY WITH PROPOFOL;  Surgeon: Manya Silvas, MD;  Location: [place];  Service: Endoscopy;  Laterality: N/A;    Family History  Problem Relation Age of Onset   Lung cancer Father    Leukemia Sister    Diabetes Sister    Asthma Child     Social History   Socioeconomic History   Marital status: Widowed    Spouse name: Not on file   Number of children: Not on file   Years of education: Not on file   Highest education level: Not on file  Occupational History   Not on file  Tobacco Use   Smoking status: Former Smoker   Smokeless tobacco: Never Used  Substance and Sexual Activity   Alcohol use: No    Alcohol/week: 0.0 standard drinks   Drug use: No   Sexual activity: Never  Other Topics Concern   Not on file  Social History Narrative   Not on file   Social Determinants of Health   Financial Resource Strain:    Difficulty of Paying Living Expenses: Not on file  Food Insecurity:    Worried About [place] in the Last Year: Not on file   Ran Out of Food in the Last Year: Not on file  Transportation Needs:    Lack of Transportation (Medical): Not on file   Lack of Transportation (Non-Medical): Not on file  Physical Activity:      Days of Exercise per Week: Not on file   Minutes of Exercise per Session: Not on file  Stress:    Feeling of Stress : Not on file  Social Connections:    Frequency of Communication with Friends and Family: Not on file   Frequency of Social Gatherings with Friends and Family: Not on file   Attends Religious Services: Not on file   Active Member of [place] or Organizations: Not on file   Attends Archivist Meetings: Not on file   Marital Status: Not on file  Intimate Partner Violence:    Fear of Current or Ex-Partner: Not on file   Emotionally Abused: Not on file   Physically Abused: Not on file   Sexually Abused: Not on file     Current Outpatient Medications:    albuterol (PROVENTIL HFA;VENTOLIN HFA) 108 (90 Base) MCG/ACT inhaler, Inhale 2 puffs into the lungs every 4 (four) hours as needed for wheezing or shortness of breath., Disp: 1 Inhaler, Rfl: 0   atenolol (TENORMIN) 25 MG tablet, Take 1 tablet (25 mg total) by mouth daily., Disp: 30 tablet, Rfl: 6   atorvastatin (LIPITOR) 40 MG tablet, Take 40 mg by mouth daily.,  Disp: , Rfl:    furosemide (LASIX) 20 MG tablet, Take 1 tablet (20 mg total) by mouth every other day., Disp: 30 tablet, Rfl: 6   glimepiride (AMARYL) 2 MG tablet, TAKE 1 TABLET BY MOUTH TWICE DAILY, Disp: 180 tablet, Rfl: 3   losartan-hydrochlorothiazide (HYZAAR) 100-25 MG tablet, Take 1 tablet by mouth daily., Disp: , Rfl:    metFORMIN (GLUCOPHAGE) 1000 MG tablet, Take 1,000 mg by mouth 2 (two) times daily. , Disp: , Rfl:    pregabalin (LYRICA) 50 MG capsule, TAKE 1 CAPSULE BY MOUTH TWICE DAILY (Patient taking differently: Take 50 mg by mouth 2 (two) times daily. ), Disp: 60 capsule, Rfl: 6   sitaGLIPtin (JANUVIA) 100 MG tablet, Take 1 tablet (100 mg total) by mouth daily., Disp: 90 tablet, Rfl: 3   ferrous sulfate 300 (60 Fe) MG/5ML syrup, Take 5 mLs (300 mg total) by mouth daily., Disp: 150 mL, Rfl: 3   No Known  Allergies  ROS Review of Systems  Constitutional: Negative.   HENT: Negative.   Eyes: Negative.   Respiratory: Negative.   Cardiovascular: Positive for leg swelling.  Gastrointestinal: Negative.   Genitourinary: Negative.   Musculoskeletal: Negative.   Psychiatric/Behavioral: Negative.   All other systems reviewed and are negative.    OBJECTIVE:    Physical Exam Vitals and nursing note reviewed.  Constitutional:      Appearance: Normal appearance.  HENT:     Mouth/Throat:     Mouth: Mucous membranes are moist.  Pulmonary:     Effort: Pulmonary effort is normal.  Musculoskeletal:        General: Normal range of motion.  Skin:    General: Skin is warm.  Neurological:     Mental Status: She is alert.  Psychiatric:        Mood and Affect: Mood normal.     BP (!) 144/73    Pulse 68    Ht 5\' 3"  (1.6 m)    Wt 235 lb 12.8 oz (107 kg)    BMI 41.77 kg/m  Wt Readings from Last 3 Encounters:  04/06/20 235 lb 12.8 oz (107 kg)  02/11/20 235 lb 9.6 oz (106.9 kg)  01/06/20 238 lb 1.6 oz (108 kg)    Health Maintenance Due  Topic Date Due   FOOT EXAM  Never done   OPHTHALMOLOGY EXAM  Never done   COVID-19 Vaccine (1) Never done   TETANUS/TDAP  Never done   PNA vac Low Risk Adult (2 of 2 - PCV13) 04/11/2004   INFLUENZA VACCINE  12/27/2019    There are no preventive care reminders to display for this patient.  CBC Latest Ref Rng & Units 11/23/2019 09/29/2019 09/17/2018  WBC 3.8 - 10.8 Thousand/uL 5.7 6.2 5.5  Hemoglobin 11.7 - 15.5 g/dL 11.1(L) 11.8(L) 13.1  Hematocrit 35 - 45 % 34.2(L) 36.0 41.3  Platelets 140 - 400 Thousand/uL 214 203 219   CMP Latest Ref Rng & Units 11/23/2019 09/29/2019 09/17/2018  Glucose 65 - 99 mg/dL 133(H) 266(H) 167(H)  BUN 7 - 25 mg/dL 21 19 15   Creatinine 0.60 - 0.88 mg/dL 0.87 0.72 0.88  Sodium 135 - 146 mmol/L 142 138 141  Potassium 3.5 - 5.3 mmol/L 3.6 3.3(L) 3.8  Chloride 98 - 110 mmol/L 105 107 108  CO2 20 - 32 mmol/L 23 24 25    Calcium 8.6 - 10.4 mg/dL 9.6 9.6 9.6  Total Protein 6.1 - 8.1 g/dL 6.6 7.3 6.7  Total Bilirubin 0.2 - 1.2 mg/dL  0.8 1.0 0.7  Alkaline Phos 38 - 126 U/L - 91 73  AST 10 - 35 U/L 22 22 18   ALT 6 - 29 U/L 17 18 18     Lab Results  Component Value Date   TSH 0.56 11/23/2019   Lab Results  Component Value Date   ALBUMIN 3.8 09/29/2019   ANIONGAP 7 09/29/2019   No results found for: CHOL, HDL, LDLCALC, CHOLHDL No results found for: TRIG Lab Results  Component Value Date   HGBA1C 7.1 (H) 11/23/2019      ASSESSMENT & PLAN:   Problem List Items Addressed This Visit      Cardiovascular and Mediastinum   Essential hypertension    Patient's blood pressure is within the desired range. Medication side effects include: no side effects noted Continue current treatment regimen. Continue current medications.         Endocrine   Type 2 diabetes mellitus without complication, without long-term current use of insulin ([place]) - Primary    Most recent A1C wnl at 7.1, GFR 72 so DM well controlled.       Relevant Orders   POCT glucose (manual entry) (Completed)     Other   Hypercholesteremia    Patient taking Lipitor, no s/e. Will draw lipid panel next blood draw.          No orders of the defined types were placed in this encounter.     Follow-up: No follow-ups on file.    Beckie Salts, [place] [address] [place], [place] [address]

## 2020-04-15 ENCOUNTER — Other Ambulatory Visit: Payer: Self-pay | Admitting: *Deleted

## 2020-04-15 MED ORDER — METFORMIN HCL 1000 MG PO TABS: 1000.0000 mg | ORAL_TABLET | Freq: Two times a day (BID) | ORAL | 3 refills | Status: DC

## 2020-04-15 MED ORDER — LOSARTAN POTASSIUM-HCTZ 100-25 MG PO TABS: 1.0000 | ORAL_TABLET | Freq: Every day | ORAL | 3 refills | Status: DC

## 2020-05-30 ENCOUNTER — Other Ambulatory Visit: Payer: Self-pay | Admitting: Internal Medicine

## 2020-06-16 ENCOUNTER — Other Ambulatory Visit: Payer: Self-pay | Admitting: *Deleted

## 2020-06-16 MED ORDER — PREGABALIN 50 MG PO CAPS: 50.0000 mg | ORAL_CAPSULE | Freq: Two times a day (BID) | ORAL | 1 refills | Status: DC

## 2020-06-23 DIAGNOSIS — E119 Type 2 diabetes mellitus without complications: Secondary | ICD-10-CM | POA: Diagnosis not present

## 2020-07-05 DIAGNOSIS — H903 Sensorineural hearing loss, bilateral: Secondary | ICD-10-CM | POA: Diagnosis not present

## 2020-07-06 ENCOUNTER — Encounter: Payer: Self-pay | Admitting: Internal Medicine

## 2020-07-06 ENCOUNTER — Other Ambulatory Visit: Payer: Self-pay

## 2020-07-06 ENCOUNTER — Ambulatory Visit (INDEPENDENT_AMBULATORY_CARE_PROVIDER_SITE_OTHER): Payer: Medicare Other | Admitting: Internal Medicine

## 2020-07-06 VITALS — BP 135/73 | HR 77 | Ht 64.0 in | Wt 237.0 lb

## 2020-07-06 DIAGNOSIS — J4521 Mild intermittent asthma with (acute) exacerbation: Secondary | ICD-10-CM

## 2020-07-06 DIAGNOSIS — E119 Type 2 diabetes mellitus without complications: Secondary | ICD-10-CM

## 2020-07-06 DIAGNOSIS — I10 Essential (primary) hypertension: Secondary | ICD-10-CM | POA: Diagnosis not present

## 2020-07-06 DIAGNOSIS — J301 Allergic rhinitis due to pollen: Secondary | ICD-10-CM | POA: Diagnosis not present

## 2020-07-06 LAB — GLUCOSE, POCT (MANUAL RESULT ENTRY): POC Glucose: 118 mg/dl — AB (ref 70–99)

## 2020-07-06 NOTE — Assessment & Plan Note (Signed)
stable °

## 2020-07-06 NOTE — Assessment & Plan Note (Signed)
.  -   I encouraged the patient to eat a low-sodium diet to help control blood pressure. - I encouraged the patient to live an active lifestyle and complete activities that increases heart rate to 85% target heart rate at least 5 times per week for one hour.     

## 2020-07-06 NOTE — Progress Notes (Signed)
Established Patient Office Visit  Subjective:  Patient ID: Vanessa Vazquez, female    DOB: 1935/04/19  Age: 86 y.o. MRN: [id number]  CC:  Chief Complaint  Patient presents with  . Follow-up    Patient is here for her 3 month blood pressure and diabetes follow up    HPI  [name] presents forbs check, patient is known to have diabetes GERD hyperlipidemia hypertension.  Past Medical History:  Diagnosis Date  . Asthma   . Diabetes mellitus without complication ([place])   . GERD (gastroesophageal reflux disease)   . Hypercholesteremia   . Hypertension     Past Surgical History:  Procedure Laterality Date  . CESAREAN SECTION    . COLONOSCOPY WITH PROPOFOL N/A 03/19/2016   Procedure: COLONOSCOPY WITH PROPOFOL;  Surgeon: Manya Silvas, MD;  Location: [facility] ENDOSCOPY;  Service: Endoscopy;  Laterality: N/A;    Family History  Problem Relation Age of Onset  . Lung cancer Father   . Leukemia Sister   . Diabetes Sister   . Asthma Child     Social History   Socioeconomic History  . Marital status: Widowed    Spouse name: Not on file  . Number of children: Not on file  . Years of education: Not on file  . Highest education level: Not on file  Occupational History  . Not on file  Tobacco Use  . Smoking status: Former Research scientist (life sciences)  . Smokeless tobacco: Never Used  Substance and Sexual Activity  . Alcohol use: No    Alcohol/week: 0.0 standard drinks  . Drug use: No  . Sexual activity: Never  Other Topics Concern  . Not on file  Social History Narrative  . Not on file   Social Determinants of Health   Financial Resource Strain: Not on file  Food Insecurity: Not on file  Transportation Needs: Not on file  Physical Activity: Not on file  Stress: Not on file  Social Connections: Not on file  Intimate Partner Violence: Not on file     Current Outpatient Medications:  .  albuterol (PROVENTIL HFA;VENTOLIN HFA) 108 (90 Base) MCG/ACT inhaler, Inhale 2 puffs into the  lungs every 4 (four) hours as needed for wheezing or shortness of breath., Disp: 1 Inhaler, Rfl: 0 .  atenolol (TENORMIN) 25 MG tablet, Take 1 tablet (25 mg total) by mouth daily., Disp: 30 tablet, Rfl: 6 .  atorvastatin (LIPITOR) 40 MG tablet, Take 40 mg by mouth daily., Disp: , Rfl:  .  furosemide (LASIX) 20 MG tablet, Take 1 tablet (20 mg total) by mouth every other day., Disp: 30 tablet, Rfl: 6 .  glimepiride (AMARYL) 2 MG tablet, TAKE 1 TABLET BY MOUTH TWICE DAILY, Disp: 180 tablet, Rfl: 3 .  losartan-hydrochlorothiazide (HYZAAR) 100-25 MG tablet, Take 1 tablet by mouth daily., Disp: 90 tablet, Rfl: 3 .  metFORMIN (GLUCOPHAGE) 1000 MG tablet, Take 1 tablet (1,000 mg total) by mouth 2 (two) times daily., Disp: 180 tablet, Rfl: 3 .  pregabalin (LYRICA) 50 MG capsule, Take 1 capsule (50 mg total) by mouth 2 (two) times daily., Disp: 60 capsule, Rfl: 1 .  sitaGLIPtin (JANUVIA) 100 MG tablet, Take 1 tablet (100 mg total) by mouth daily., Disp: 90 tablet, Rfl: 3 .  ferrous sulfate 300 (60 Fe) MG/5ML syrup, Take 5 mLs (300 mg total) by mouth daily., Disp: 150 mL, Rfl: 3   No Known Allergies  ROS Review of Systems  Constitutional: Negative.   HENT: Negative.  Eyes: Negative.   Respiratory: Negative.   Cardiovascular: Negative.   Gastrointestinal: Negative.   Endocrine: Negative.   Genitourinary: Negative.   Musculoskeletal: Negative.   Skin: Negative.   Allergic/Immunologic: Negative.   Neurological: Negative.   Hematological: Negative.   Psychiatric/Behavioral: Negative.   All other systems reviewed and are negative.     Objective:    Physical Exam Vitals reviewed.  Constitutional:      Appearance: Normal appearance. She is obese.  HENT:     Mouth/Throat:     Mouth: Mucous membranes are moist.  Eyes:     Pupils: Pupils are equal, round, and reactive to light.  Neck:     Vascular: No carotid bruit.  Cardiovascular:     Rate and Rhythm: Normal rate and regular rhythm.      Pulses: Normal pulses.     Heart sounds: Normal heart sounds.  Pulmonary:     Effort: Pulmonary effort is normal.     Breath sounds: Normal breath sounds.  Abdominal:     General: Bowel sounds are normal.     Palpations: Abdomen is soft. There is no hepatomegaly, splenomegaly or mass.     Tenderness: There is no abdominal tenderness.     Hernia: No hernia is present.  Musculoskeletal:        General: No tenderness.     Cervical back: Neck supple.     Right lower leg: No edema.     Left lower leg: No edema.  Skin:    Findings: No rash.  Neurological:     Mental Status: She is alert and oriented to person, place, and time.     Motor: No weakness.  Psychiatric:        Mood and Affect: Mood and affect normal.        Behavior: Behavior normal.     BP 135/73   Pulse 77   Ht 5\' 4"  (1.626 m)   Wt 237 lb (107.5 kg)   BMI 40.68 kg/m  Wt Readings from Last 3 Encounters:  07/06/20 237 lb (107.5 kg)  04/06/20 235 lb 12.8 oz (107 kg)  02/11/20 235 lb 9.6 oz (106.9 kg)     Health Maintenance Due  Topic Date Due  . COVID-19 Vaccine (1) Never done  . FOOT EXAM  Never done  . TETANUS/TDAP  Never done  . PNA vac Low Risk Adult (2 of 2 - PCV13) 04/11/2004  . INFLUENZA VACCINE  12/27/2019  . HEMOGLOBIN A1C  05/24/2020    There are no preventive care reminders to display for this patient.  Lab Results  Component Value Date   TSH 0.56 11/23/2019   Lab Results  Component Value Date   WBC 5.7 11/23/2019   HGB 11.1 (L) 11/23/2019   HCT 34.2 (L) 11/23/2019   MCV 96.6 11/23/2019   PLT 214 11/23/2019   Lab Results  Component Value Date   NA 142 11/23/2019   K 3.6 11/23/2019   CO2 23 11/23/2019   GLUCOSE 133 (H) 11/23/2019   BUN 21 11/23/2019   CREATININE 0.87 11/23/2019   BILITOT 0.8 11/23/2019   ALKPHOS 91 09/29/2019   AST 22 11/23/2019   ALT 17 11/23/2019   PROT 6.6 11/23/2019   ALBUMIN 3.8 09/29/2019   CALCIUM 9.6 11/23/2019   ANIONGAP 7 09/29/2019   No results  found for: CHOL No results found for: HDL No results found for: LDLCALC No results found for: TRIG No results found for: [facility] Lab Results  Component  Value Date   HGBA1C 7.1 (H) 11/23/2019      Assessment & Plan:   Problem List Items Addressed This Visit      Cardiovascular and Mediastinum   Essential hypertension     - I encouraged the patient to eat a low-sodium diet to help control blood pressure. - I encouraged the patient to live an active lifestyle and complete activities that increases heart rate to 85% target heart rate at least 5 times per week for one hour.             Respiratory   Asthma    stable      Seasonal allergic rhinitis due to pollen     Endocrine   Type 2 diabetes mellitus without complication, without long-term current use of insulin ([place]) - Primary     - I encouraged the patient to regularly check blood sugar.  - I encouraged the patient to monitor diet. I encouraged the patient to eat low-carb and low-sugar to help prevent blood sugar spikes.  - I encouraged the patient to continue following their prescribed treatment plan for diabetes - I informed the patient to get help if blood sugar drops below 54mg /dL, or if suddenly have trouble thinking clearly or breathing.          Relevant Orders   POCT glucose (manual entry) (Completed)     Other   RESOLVED: Class 2 severe obesity due to excess calories with serious comorbidity in adult [facility])    - I encouraged the patient to lose weight.  - I educated them on making healthy dietary choices including eating more fruits and vegetables and less fried foods. - I encouraged the patient to exercise more, and educated on the benefits of exercise including weight loss, diabetes prevention, and hypertension prevention.           No orders of the defined types were placed in this encounter.   Follow-up: No follow-ups on file.    Cletis Athens, MD

## 2020-07-06 NOTE — Assessment & Plan Note (Signed)
-   I encouraged the patient to regularly check blood sugar.  - I encouraged the patient to monitor diet. I encouraged the patient to eat low-carb and low-sugar to help prevent blood sugar spikes.  - I encouraged the patient to continue following their prescribed treatment plan for diabetes - I informed the patient to get help if blood sugar drops below 54mg/dL, or if suddenly have trouble thinking clearly or breathing.     

## 2020-07-06 NOTE — Assessment & Plan Note (Signed)
-   I encouraged the patient to lose weight.  - I educated them on making healthy dietary choices including eating more fruits and vegetables and less fried foods. - I encouraged the patient to exercise more, and educated on the benefits of exercise including weight loss, diabetes prevention, and hypertension prevention.   

## 2020-07-11 DIAGNOSIS — Z20822 Contact with and (suspected) exposure to covid-19: Secondary | ICD-10-CM | POA: Diagnosis not present

## 2020-07-17 IMAGING — CT CT HEAD WITHOUT CONTRAST
3 series · 15 of 45 positions shown, 18 images · non-contrast
Comparison: CT head 02/05/2018

CLINICAL DATA: Weak and dizzy

EXAM:
CT HEAD WITHOUT CONTRAST
TECHNIQUE: Contiguous axial images were obtained from the base of the skull
through the vertex without intravenous contrast.

[Series 2: head wo · axial · 0.47mm/px · z∈[+324,+439]mm · 9 of 28 slices shown, 12 images]
[im 3/28  brain]
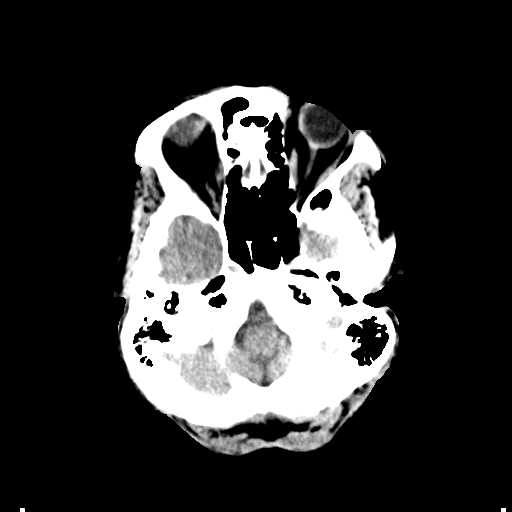
[im 3/28  bone]
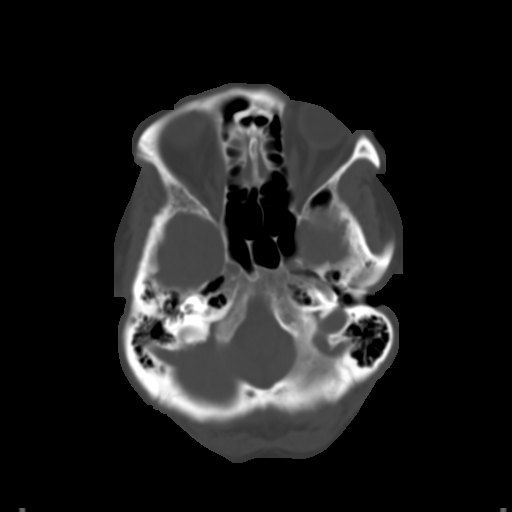
[im 6/28  brain]
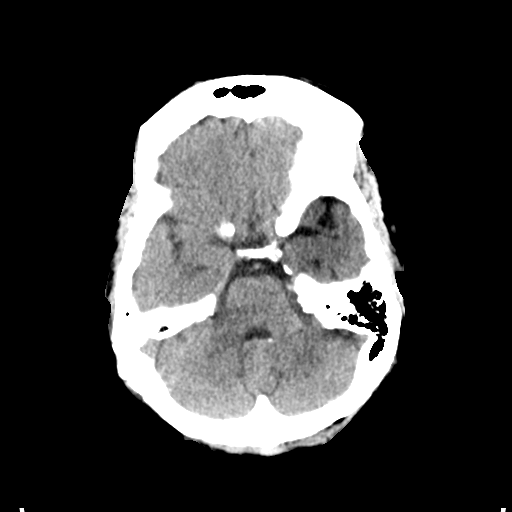
[im 9/28  brain]
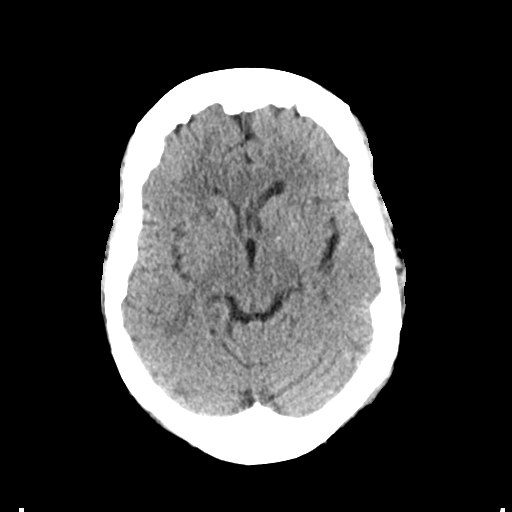
[im 12/28  brain]
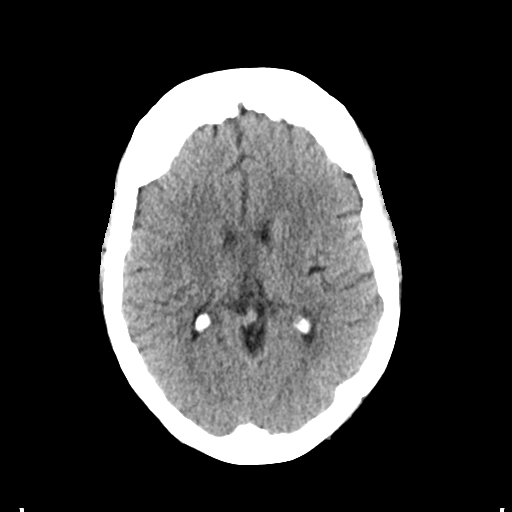
[im 15/28  brain]
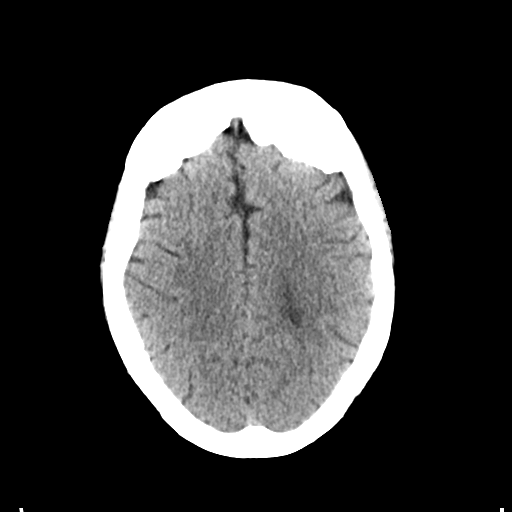
[im 15/28  bone]
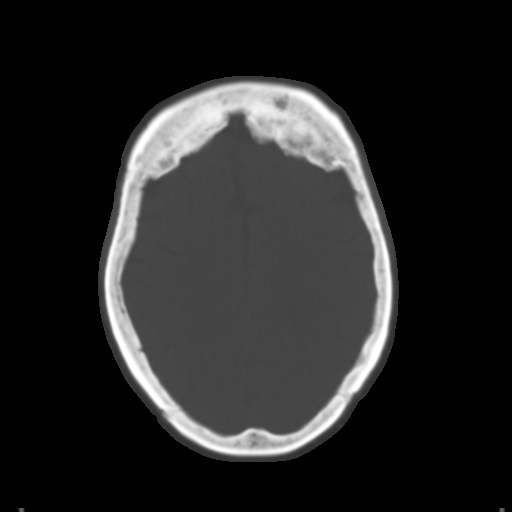
[im 17/28  brain]
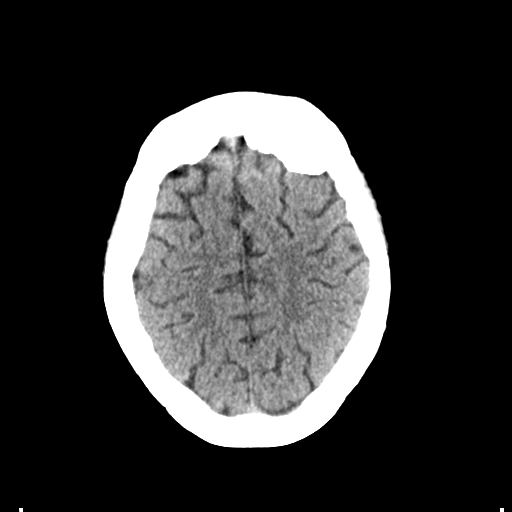
[im 20/28  brain]
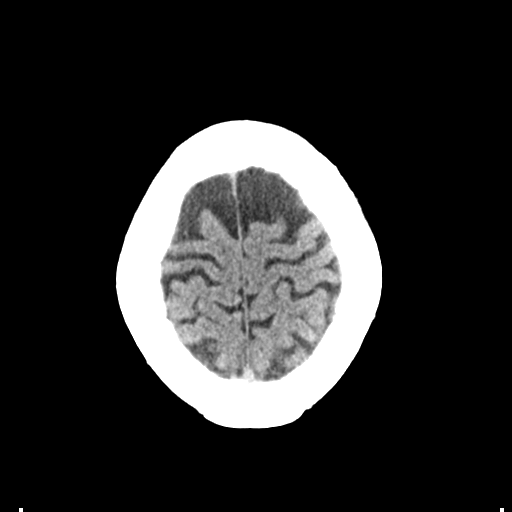
[im 23/28  brain]
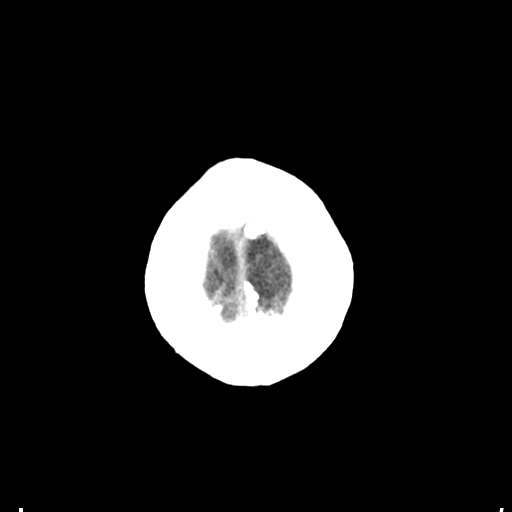
[im 26/28  brain]
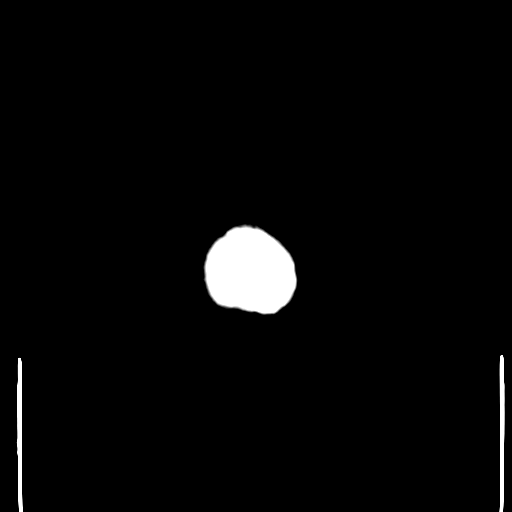
[im 26/28  bone]
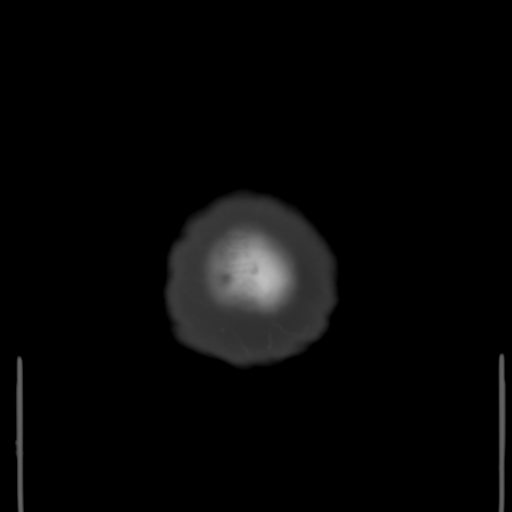

[Series 4: coronal soft tissue · coronal · 0.27mm/px · 3 of 65 slices shown]
[im 22/65  brain]
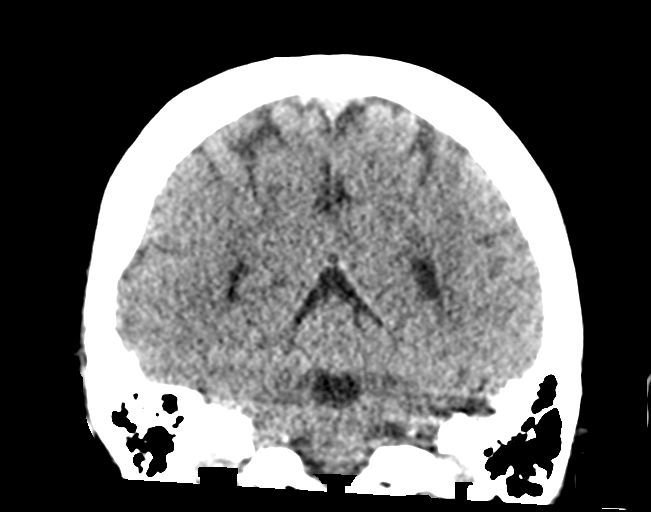
[im 29/65  brain]
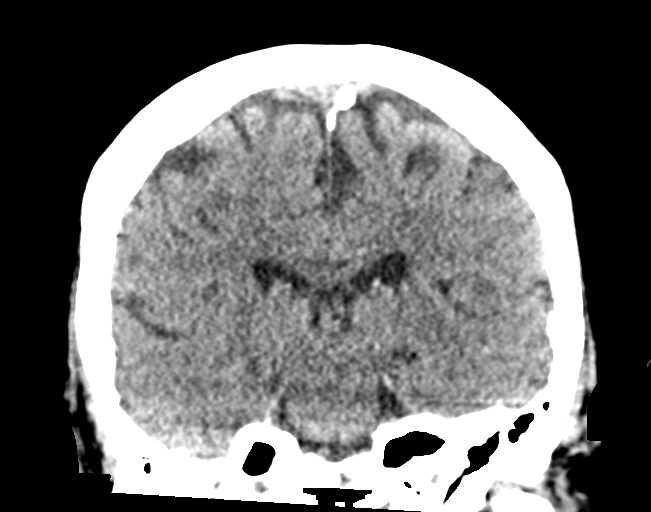
[im 36/65  brain]
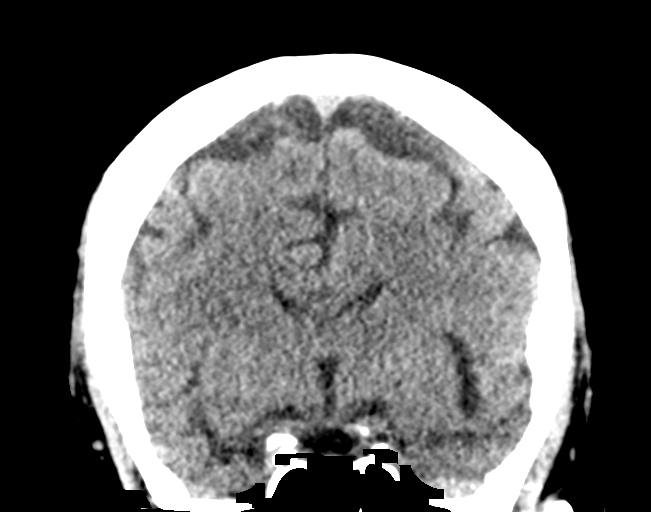

[Series 5: sagittal soft tissue · sagittal · 0.27mm/px · 3 of 59 slices shown]
[im 20/59  brain]
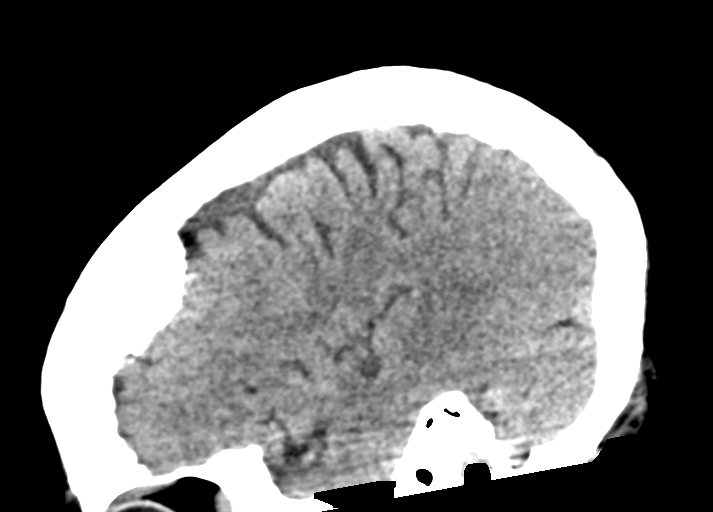
[im 30/59  brain]
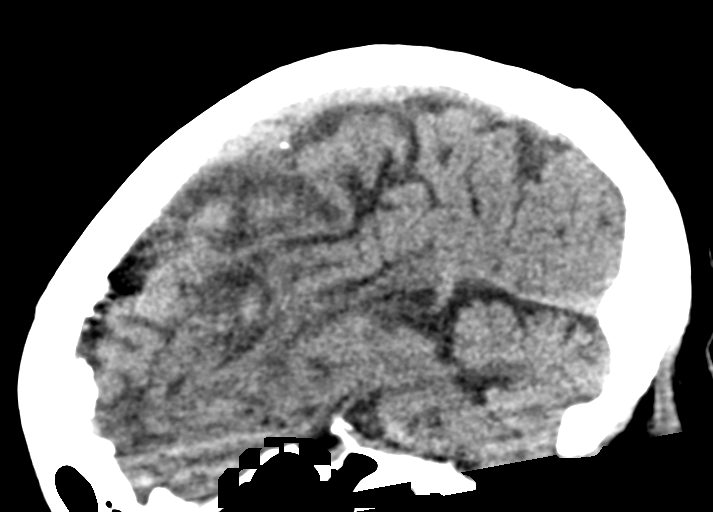
[im 39/59  brain]
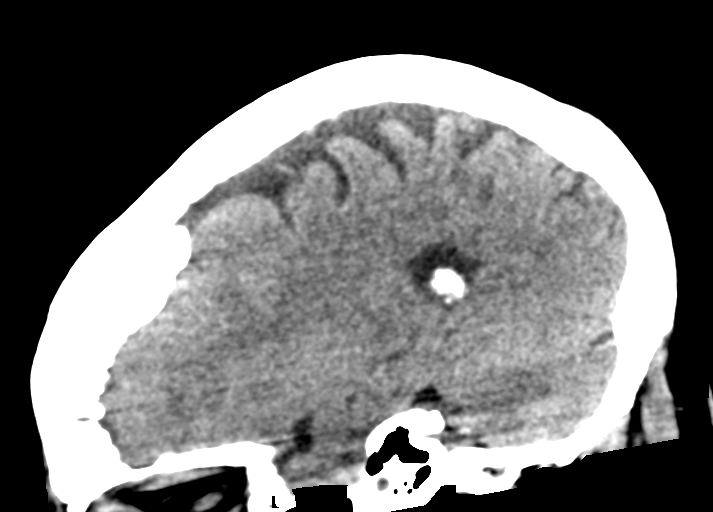

[15 of 45 positions shown; findings below may reference images not displayed]

FINDINGS: Brain: Ventricle size and cerebral volume normal. Negative for acute
infarct, hemorrhage, mass. No midline shift.

Vascular: Negative for hyperdense vessel

Skull: Negative

Sinuses/Orbits: Negative

Other: None
IMPRESSION: No acute intracranial abnormality.  No interval change.

## 2020-07-20 DIAGNOSIS — H6123 Impacted cerumen, bilateral: Secondary | ICD-10-CM | POA: Diagnosis not present

## 2020-07-20 DIAGNOSIS — H903 Sensorineural hearing loss, bilateral: Secondary | ICD-10-CM | POA: Diagnosis not present

## 2020-07-26 ENCOUNTER — Other Ambulatory Visit: Payer: Self-pay | Admitting: Internal Medicine

## 2020-07-26 DIAGNOSIS — H905 Unspecified sensorineural hearing loss: Secondary | ICD-10-CM | POA: Diagnosis not present

## 2020-08-11 ENCOUNTER — Other Ambulatory Visit: Payer: Self-pay

## 2020-08-11 ENCOUNTER — Other Ambulatory Visit: Payer: Self-pay | Admitting: Internal Medicine

## 2020-08-11 DIAGNOSIS — E119 Type 2 diabetes mellitus without complications: Secondary | ICD-10-CM

## 2020-08-11 MED ORDER — DAPAGLIFLOZIN PROPANEDIOL 10 MG PO TABS: 10.0000 mg | ORAL_TABLET | Freq: Every day | ORAL | 3 refills | Status: DC

## 2020-09-12 ENCOUNTER — Other Ambulatory Visit: Payer: Self-pay | Admitting: *Deleted

## 2020-09-12 MED ORDER — ATORVASTATIN CALCIUM 40 MG PO TABS: 40.0000 mg | ORAL_TABLET | Freq: Every day | ORAL | 3 refills | Status: DC

## 2020-09-13 ENCOUNTER — Other Ambulatory Visit: Payer: Self-pay | Admitting: *Deleted

## 2020-09-13 ENCOUNTER — Other Ambulatory Visit: Payer: Self-pay

## 2020-09-13 MED ORDER — ALBUTEROL SULFATE HFA 108 (90 BASE) MCG/ACT IN AERS
2.0000 | INHALATION_SPRAY | RESPIRATORY_TRACT | 0 refills | Status: DC | PRN
Start: 2020-09-13 — End: 2020-09-13

## 2020-09-13 MED ORDER — ALBUTEROL SULFATE HFA 108 (90 BASE) MCG/ACT IN AERS: 2.0000 | INHALATION_SPRAY | RESPIRATORY_TRACT | 0 refills | Status: DC | PRN

## 2020-09-13 MED ORDER — LORATADINE 10 MG PO TABS: 10.0000 mg | ORAL_TABLET | Freq: Every day | ORAL | 3 refills | Status: DC

## 2020-09-13 MED ORDER — FUROSEMIDE 20 MG PO TABS: 20.0000 mg | ORAL_TABLET | ORAL | 3 refills | Status: DC

## 2020-10-02 ENCOUNTER — Encounter: Payer: Self-pay | Admitting: Intensive Care

## 2020-10-02 ENCOUNTER — Emergency Department: Payer: Medicare Other

## 2020-10-02 ENCOUNTER — Other Ambulatory Visit: Payer: Self-pay

## 2020-10-02 ENCOUNTER — Emergency Department
Admission: EM | Admit: 2020-10-02 | Discharge: 2020-10-02 | Disposition: A | Discharge status: Home / Self Care | Payer: Medicare Other | Attending: Emergency Medicine | Admitting: Emergency Medicine

## 2020-10-02 DIAGNOSIS — M542 Cervicalgia: Secondary | ICD-10-CM | POA: Diagnosis not present

## 2020-10-02 DIAGNOSIS — Z743 Need for continuous supervision: Secondary | ICD-10-CM | POA: Diagnosis not present

## 2020-10-02 DIAGNOSIS — M79644 Pain in right finger(s): Secondary | ICD-10-CM | POA: Diagnosis not present

## 2020-10-02 DIAGNOSIS — R609 Edema, unspecified: Secondary | ICD-10-CM | POA: Diagnosis not present

## 2020-10-02 DIAGNOSIS — R52 Pain, unspecified: Secondary | ICD-10-CM | POA: Diagnosis not present

## 2020-10-02 DIAGNOSIS — S6991XA Unspecified injury of right wrist, hand and finger(s), initial encounter: Secondary | ICD-10-CM | POA: Insufficient documentation

## 2020-10-02 DIAGNOSIS — S0083XA Contusion of other part of head, initial encounter: Secondary | ICD-10-CM | POA: Diagnosis not present

## 2020-10-02 DIAGNOSIS — Z7984 Long term (current) use of oral hypoglycemic drugs: Secondary | ICD-10-CM | POA: Insufficient documentation

## 2020-10-02 DIAGNOSIS — Y9222 Religious institution as the place of occurrence of the external cause: Secondary | ICD-10-CM | POA: Diagnosis not present

## 2020-10-02 DIAGNOSIS — I10 Essential (primary) hypertension: Secondary | ICD-10-CM | POA: Diagnosis not present

## 2020-10-02 DIAGNOSIS — J45909 Unspecified asthma, uncomplicated: Secondary | ICD-10-CM | POA: Diagnosis not present

## 2020-10-02 DIAGNOSIS — Y9301 Activity, walking, marching and hiking: Secondary | ICD-10-CM | POA: Diagnosis not present

## 2020-10-02 DIAGNOSIS — E119 Type 2 diabetes mellitus without complications: Secondary | ICD-10-CM | POA: Insufficient documentation

## 2020-10-02 DIAGNOSIS — R519 Headache, unspecified: Secondary | ICD-10-CM | POA: Diagnosis not present

## 2020-10-02 DIAGNOSIS — Z87891 Personal history of nicotine dependence: Secondary | ICD-10-CM | POA: Insufficient documentation

## 2020-10-02 DIAGNOSIS — S0990XA Unspecified injury of head, initial encounter: Secondary | ICD-10-CM

## 2020-10-02 DIAGNOSIS — Z79899 Other long term (current) drug therapy: Secondary | ICD-10-CM | POA: Insufficient documentation

## 2020-10-02 DIAGNOSIS — W01198A Fall on same level from slipping, tripping and stumbling with subsequent striking against other object, initial encounter: Secondary | ICD-10-CM | POA: Insufficient documentation

## 2020-10-02 DIAGNOSIS — E1165 Type 2 diabetes mellitus with hyperglycemia: Secondary | ICD-10-CM | POA: Diagnosis not present

## 2020-10-02 DIAGNOSIS — W231XXA Caught, crushed, jammed, or pinched between stationary objects, initial encounter: Secondary | ICD-10-CM | POA: Diagnosis not present

## 2020-10-02 NOTE — ED Triage Notes (Signed)
[place] EMS from [place] with c/[place] fall. EMS reports pt was walking in, her foot got caught on a rug and fell hitting her head. Pt with hematoma to rt side of head. No LOC. Pt takes 1 low dose asa. Pt also jammed a finger. FSBS 154, 139/89, HR 81.

## 2020-10-02 NOTE — ED Notes (Signed)
Pt ambulatory in hallway without assistance, cane used. EDP notified.

## 2020-10-02 NOTE — ED Provider Notes (Signed)
[facility] Emergency Department Provider Note ____________________________________________   Event Date/Time   First MD Initiated Contact with Patient 10/02/20 1210     (approximate)  I have reviewed the triage vital signs and the nursing notes.   HISTORY  Chief Complaint Fall  HPI Vanessa Vazquez is a 86 y.o. female with history of asthma, diabetes, and other as listed below presents to the emergency department for treatment and evaluation after mechanical fall prior to arrival. She got her foot caught on a rug while at church and fell. She hit her head and jammed her right index finger. She denies loss of consciousness. She denies neck pain.      Past Medical History:  Diagnosis Date  . Asthma   . Diabetes mellitus without complication ([place])   . GERD (gastroesophageal reflux disease)   . Hypercholesteremia   . Hypertension     Patient Active Problem List   Diagnosis Date Noted  . Fatigue 02/11/2020  . Iron deficiency anemia secondary to inadequate dietary iron intake 01/06/2020  . Annual physical exam 01/06/2020  . Class 3 severe obesity due to excess calories with serious comorbidity and body mass index (BMI) of 40.0 to 44.9 in adult ([place]) 11/23/2019  . Seasonal allergic rhinitis due to pollen 10/21/2019  . Type 2 diabetes mellitus without complication, without long-term current use of insulin ([place]) 10/21/2019  . GI bleed 03/17/2016  . Vaginal odor 12/14/2015  . [place]var itching 12/14/2015  . Vaginal leukorrhea 12/14/2015  . Essential hypertension   . Hypercholesteremia   . Asthma     Past Surgical History:  Procedure Laterality Date  . CESAREAN SECTION    . COLONOSCOPY WITH PROPOFOL N/A 03/19/2016   Procedure: COLONOSCOPY WITH PROPOFOL;  Surgeon: Manya Silvas, MD;  Location: [facility] ENDOSCOPY;  Service: Endoscopy;  Laterality: N/A;    Prior to Admission medications   Medication Sig Start Date End Date Taking? Authorizing Provider   albuterol (VENTOLIN HFA) 108 (90 Base) MCG/ACT inhaler Inhale 2 puffs into the lungs every 4 (four) hours as needed for wheezing or shortness of breath. 09/13/20   Cletis Athens, MD  atenolol (TENORMIN) 25 MG tablet Take 1 tablet (25 mg total) by mouth daily. 02/17/20   Cletis Athens, MD  atorvastatin (LIPITOR) 40 MG tablet Take 1 tablet (40 mg total) by mouth daily. 09/12/20   Cletis Athens, MD  dapagliflozin propanediol (FARXIGA) 10 MG TABS tablet Take 10 mg by mouth daily.    [provider]  dapagliflozin propanediol (FARXIGA) 10 MG TABS tablet Take 1 tablet (10 mg total) by mouth daily. 08/11/20   Cletis Athens, MD  ferrous sulfate 300 (60 Fe) MG/5ML syrup Take 5 mLs (300 mg total) by mouth daily. 01/06/20 02/05/20  Cletis Athens, MD  furosemide (LASIX) 20 MG tablet Take 1 tablet (20 mg total) by mouth every other day. 09/13/20   Cletis Athens, MD  glimepiride (AMARYL) 2 MG tablet TAKE 1 TABLET BY MOUTH TWICE DAILY 10/15/19   Cletis Athens, MD  loratadine (CLARITIN) 10 MG tablet Take 1 tablet (10 mg total) by mouth daily. 09/13/20   Cletis Athens, MD  losartan-hydrochlorothiazide (HYZAAR) 100-25 MG tablet Take 1 tablet by mouth daily. 04/15/20   Cletis Athens, MD  metFORMIN (GLUCOPHAGE) 1000 MG tablet Take 1 tablet (1,000 mg total) by mouth 2 (two) times daily. 04/15/20   Cletis Athens, MD  pregabalin (LYRICA) 50 MG capsule Take 1 capsule (50 mg total) by mouth 2 (two) times daily. 06/16/20  Cletis Athens, MD  sitaGLIPtin (JANUVIA) 100 MG tablet Take 1 tablet (100 mg total) by mouth daily. 04/04/20   Cletis Athens, MD    Allergies Patient has no known allergies.  Family History  Problem Relation Age of Onset  . Lung cancer Father   . Leukemia Sister   . Diabetes Sister   . Asthma Child     Social History Social History   Tobacco Use  . Smoking status: Former Research scientist (life sciences)  . Smokeless tobacco: Never Used  Substance Use Topics  . Alcohol use: No    Alcohol/week: 0.0 standard drinks   . Drug use: No    Review of Systems  Constitutional: No fever/chills Eyes: No visual changes. ENT: No sore throat. Cardiovascular: Denies chest pain. Respiratory: Denies shortness of breath. Gastrointestinal: No abdominal pain.  No nausea, no vomiting.  No diarrhea.  No constipation. Genitourinary: Negative for dysuria. Musculoskeletal: Negative for neck or back pain. Skin: Negative for rash or open wounds. Neurological: Negative for headaches, focal weakness or numbness. ___________________________________________   PHYSICAL EXAM:  VITAL SIGNS: ED Triage Vitals  Enc Vitals Group     BP 10/02/20 1107 (!) 159/66     Pulse Rate 10/02/20 1107 76     Resp 10/02/20 1107 16     Temp 10/02/20 1107 98.4 F (36.9 C)     Temp Source 10/02/20 1107 Oral     SpO2 10/02/20 1107 94 %     Weight 10/02/20 1108 250 lb (113.4 kg)     Height --      Head Circumference --      Peak Flow --      Pain Score 10/02/20 1107 8     Pain Loc --      Pain Edu? --      Excl. in [place]? --     Constitutional: Alert and oriented. Well appearing and in no acute distress. Eyes: Conjunctivae are normal. Head: Atraumatic. Nose: No congestion/rhinnorhea. Mouth/Throat: Mucous membranes are moist. Oropharynx non-erythematous. Neck: No stridor.  No focal midline tenderness.  Hematological/Lymphatic/Immunilogical: No cervical lymphadenopathy. Cardiovascular: Good peripheral circulation. Respiratory: Normal respiratory effort.  No retractions. Lungs CTAB. Gastrointestinal: Soft and nontender. No distention. No abdominal bruits. Genitourinary:  Musculoskeletal: No deformity of the right index finger. Neurologic:  Normal speech and language. No gross focal neurologic deficits are appreciated. Skin:  Skin is warm, dry and intact. No rash noted. Psychiatric: Mood and affect are normal. Speech and behavior are normal.  ____________________________________________   LABS (all labs ordered are listed, but  only abnormal results are displayed)  Labs Reviewed - No data to display ____________________________________________  EKG  Not indicated. ____________________________________________  RADIOLOGY  ED MD interpretation:    No acute head or cervical spine abnormality.  No acute injury of right index finger.  I, Sherrie George, personally viewed and evaluated these images (plain radiographs) as part of my medical decision making, as well as reviewing the written report by the radiologist.  Official radiology report(s): CT Head Wo Contrast  Result Date: 10/02/2020 CLINICAL DATA:  Fall with head and neck pain. EXAM: CT HEAD WITHOUT CONTRAST CT CERVICAL SPINE WITHOUT CONTRAST TECHNIQUE: Multidetector CT imaging of the head and cervical spine was performed following the standard protocol without intravenous contrast. Multiplanar CT image reconstructions of the cervical spine were also generated. COMPARISON:  CT head and cervical spine dated 02/05/2018. FINDINGS: CT HEAD FINDINGS Brain: No evidence of acute infarction, hemorrhage, hydrocephalus, extra-axial collection or mass lesion/mass effect. Vascular: There are  vascular calcifications in the carotid siphons. Skull: Normal. Negative for fracture or focal lesion. Sinuses/Orbits: No acute finding. Other: Soft tissue swelling/hematoma of the right forehead and right periorbital region. CT CERVICAL SPINE FINDINGS Alignment: Normal. Skull base and vertebrae: No acute fracture. No primary bone lesion or focal pathologic process. Soft tissues and spinal canal: No prevertebral fluid or swelling. No visible canal hematoma. Disc levels: Up to moderate to severe multilevel degenerative disc and joint disease. Upper chest: Negative. Other: A hypoechoic right thyroid nodule measures 2.5 cm. IMPRESSION: 1. No acute intracranial process. 2. No acute osseous injury in the cervical spine. 3. Hypoechoic right thyroid nodule measuring 2.5 cm. Recommend non emergent  thyroid US (ref: J Am Coll Radiol. 2015 Feb;12(2): 143-50). Electronically Signed   By: Zerita Boers M.D.   On: 10/02/2020 13:50   CT Cervical Spine Wo Contrast  Result Date: 10/02/2020 CLINICAL DATA:  Fall with head and neck pain. EXAM: CT HEAD WITHOUT CONTRAST CT CERVICAL SPINE WITHOUT CONTRAST TECHNIQUE: Multidetector CT imaging of the head and cervical spine was performed following the standard protocol without intravenous contrast. Multiplanar CT image reconstructions of the cervical spine were also generated. COMPARISON:  CT head and cervical spine dated 02/05/2018. FINDINGS: CT HEAD FINDINGS Brain: No evidence of acute infarction, hemorrhage, hydrocephalus, extra-axial collection or mass lesion/mass effect. Vascular: There are vascular calcifications in the carotid siphons. Skull: Normal. Negative for fracture or focal lesion. Sinuses/Orbits: No acute finding. Other: Soft tissue swelling/hematoma of the right forehead and right periorbital region. CT CERVICAL SPINE FINDINGS Alignment: Normal. Skull base and vertebrae: No acute fracture. No primary bone lesion or focal pathologic process. Soft tissues and spinal canal: No prevertebral fluid or swelling. No visible canal hematoma. Disc levels: Up to moderate to severe multilevel degenerative disc and joint disease. Upper chest: Negative. Other: A hypoechoic right thyroid nodule measures 2.5 cm. IMPRESSION: 1. No acute intracranial process. 2. No acute osseous injury in the cervical spine. 3. Hypoechoic right thyroid nodule measuring 2.5 cm. Recommend non emergent thyroid US (ref: J Am Coll Radiol. 2015 Feb;12(2): 143-50). Electronically Signed   By: Zerita Boers M.D.   On: 10/02/2020 13:50   DG Finger Index Right  Result Date: 10/02/2020 CLINICAL DATA:  Pain after a fall EXAM: RIGHT INDEX FINGER 2+V COMPARISON:  None. FINDINGS: There is no evidence of fracture or dislocation. Mild-to-moderate degenerative changes are seen in the interphalangeal joints.  IMPRESSION: No acute osseous injury. Electronically Signed   By: Zerita Boers M.D.   On: 10/02/2020 13:39    ____________________________________________   PROCEDURES  Procedure(s) performed (including Critical Care):  Procedures  ____________________________________________   INITIAL IMPRESSION / ASSESSMENT AND PLAN     86 year old female presenting to the emergency department after mechanical, nonsyncopal fall.  See HPI for further details.  DIFFERENTIAL DIAGNOSIS  Subarachnoid hemorrhage, cervical spine injury, index finger fracture.  ED COURSE  CT of the head and cervical spine are negative for acute concerns as is the image of her right index finger.  Patient was able to ambulate with her cane without complaining of additional injuries or pain.  Plan will be to discharge her home.  She will be encouraged to follow-up with her primary care provider or return to the emergency department for symptoms that change or worsen.    ___________________________________________   FINAL CLINICAL IMPRESSION(S) / ED DIAGNOSES  Final diagnoses:  Minor head injury, initial encounter  Traumatic hematoma of forehead, initial encounter  Injury of finger of right hand,  initial encounter     ED Discharge Orders    None       CLAUDE VESCO was evaluated in Emergency Department on 10/02/2020 for the symptoms described in the history of present illness. She was evaluated in the context of the global COVID-19 pandemic, which necessitated consideration that the patient might be at risk for infection with the SARS-CoV-2 virus that causes COVID-19. Institutional protocols and algorithms that pertain to the evaluation of patients at risk for COVID-19 are in a state of rapid change based on information released by regulatory bodies including the CDC and federal and state organizations. These policies and algorithms were followed during the patient's care in the ED.   Note:  This document was  prepared using Dragon voice recognition software and may include unintentional dictation errors.   Victorino Dike, FNP 10/02/20 1406    Naaman Plummer, MD 10/02/20 [phone]

## 2020-10-02 NOTE — Discharge Instructions (Signed)
Follow-up with primary care or return to the emergency department for symptoms of concern.  Take Tylenol as directed on the packaging if needed for pain.  Apply ice to sore areas off and on throughout the day.

## 2020-10-03 ENCOUNTER — Ambulatory Visit: Payer: Medicare Other | Admitting: Internal Medicine

## 2020-10-04 ENCOUNTER — Ambulatory Visit (INDEPENDENT_AMBULATORY_CARE_PROVIDER_SITE_OTHER): Payer: Medicare Other | Admitting: Internal Medicine

## 2020-10-04 ENCOUNTER — Encounter: Payer: Self-pay | Admitting: Internal Medicine

## 2020-10-04 VITALS — BP 148/73 | HR 62 | Ht 64.0 in | Wt 238.1 lb

## 2020-10-04 DIAGNOSIS — J301 Allergic rhinitis due to pollen: Secondary | ICD-10-CM | POA: Diagnosis not present

## 2020-10-04 DIAGNOSIS — I10 Essential (primary) hypertension: Secondary | ICD-10-CM | POA: Diagnosis not present

## 2020-10-04 DIAGNOSIS — E119 Type 2 diabetes mellitus without complications: Secondary | ICD-10-CM

## 2020-10-04 DIAGNOSIS — E7800 Pure hypercholesterolemia, unspecified: Secondary | ICD-10-CM | POA: Diagnosis not present

## 2020-10-04 DIAGNOSIS — S0990XA Unspecified injury of head, initial encounter: Secondary | ICD-10-CM | POA: Insufficient documentation

## 2020-10-04 DIAGNOSIS — J4521 Mild intermittent asthma with (acute) exacerbation: Secondary | ICD-10-CM

## 2020-10-04 DIAGNOSIS — S0990XD Unspecified injury of head, subsequent encounter: Secondary | ICD-10-CM | POA: Diagnosis not present

## 2020-10-04 DIAGNOSIS — Z6841 Body Mass Index (BMI) 40.0 and over, adult: Secondary | ICD-10-CM

## 2020-10-04 LAB — GLUCOSE, POCT (MANUAL RESULT ENTRY): POC Glucose: 164 mg/dl — AB (ref 70–99)

## 2020-10-04 NOTE — Assessment & Plan Note (Addendum)
Patient has a head injury at home.  She has swelling of the righteye rea.  No focal neurological signs are seen.  I will check her back in 10 days

## 2020-10-04 NOTE — Assessment & Plan Note (Signed)

## 2020-10-04 NOTE — Assessment & Plan Note (Signed)
Hypercholesterolemia  I advised the patient to follow Mediterranean diet This diet is rich in fruits vegetables and whole grain, and This diet is also rich in fish and lean meat Patient should also eat a handful of almonds or walnuts daily Recent heart study indicated that average follow-up on this kind of diet reduces the cardiovascular mortality by 50 to 70%== 

## 2020-10-04 NOTE — Progress Notes (Signed)
Established Patient Office Visit  Subjective:  Patient ID: Vanessa Vazquez, female    DOB: [birth year]  Age: 86 y.o. MRN: [id number]  CC:  Chief Complaint  Patient presents with  . recent fall    Patient had fall on Sunday, tripped over carpet at church. Patient was sent to ED they did ct head ct cervical spine and xray of finger. Patient states that her finger is still hurting.   . Diabetes    HPI  Vanessa Vazquez presents for patient has a history of fall.   bruised her right thigh.  There is no history of chest pain or shortness of breath. She got her foot caught on a rug while at church and fell. She hit her head and jammed her right index finger no focal neurological signs except for the bruise of the right eyebrow.  Extraocular movements are full.  Tongue is midline.  Throat is not congested.  Patient walks with the help of a walker.  Vital signs are stable.  Patient complains of pain right right index finger.  This minimally swollen on the interphalangeal joint in the middle.  No bruises noted.  No stiffness of the neck.  Past Medical History:  Diagnosis Date  . Asthma   . Diabetes mellitus without complication ([place])   . GERD (gastroesophageal reflux disease)   . Hypercholesteremia   . Hypertension     Past Surgical History:  Procedure Laterality Date  . CESAREAN SECTION    . COLONOSCOPY WITH PROPOFOL N/A 03/19/2016   Procedure: COLONOSCOPY WITH PROPOFOL;  Surgeon: Manya Silvas, MD;  Location: [facility] ENDOSCOPY;  Service: Endoscopy;  Laterality: N/A;    Family History  Problem Relation Age of Onset  . Lung cancer Father   . Leukemia Sister   . Diabetes Sister   . Asthma Child     Social History   Socioeconomic History  . Marital status: Widowed    Spouse name: Not on file  . Number of children: Not on file  . Years of education: Not on file  . Highest education level: Not on file  Occupational History  . Not on file  Tobacco Use  . Smoking status: Former Research scientist (life sciences)   . Smokeless tobacco: Never Used  Substance and Sexual Activity  . Alcohol use: No    Alcohol/week: 0.0 standard drinks  . Drug use: No  . Sexual activity: Never  Other Topics Concern  . Not on file  Social History Narrative  . Not on file   Social Determinants of Health   Financial Resource Strain: Not on file  Food Insecurity: Not on file  Transportation Needs: Not on file  Physical Activity: Not on file  Stress: Not on file  Social Connections: Not on file  Intimate Partner Violence: Not on file     Current Outpatient Medications:  .  albuterol (VENTOLIN HFA) 108 (90 Base) MCG/ACT inhaler, Inhale 2 puffs into the lungs every 4 (four) hours as needed for wheezing or shortness of breath., Disp: 1 each, Rfl: 0 .  atenolol (TENORMIN) 25 MG tablet, Take 1 tablet (25 mg total) by mouth daily., Disp: 30 tablet, Rfl: 6 .  atorvastatin (LIPITOR) 40 MG tablet, Take 1 tablet (40 mg total) by mouth daily., Disp: 90 tablet, Rfl: 3 .  dapagliflozin propanediol (FARXIGA) 10 MG TABS tablet, Take 1 tablet (10 mg total) by mouth daily., Disp: 90 tablet, Rfl: 3 .  furosemide (LASIX) 20 MG tablet, Take 1 tablet (20 mg  total) by mouth every other day., Disp: 30 tablet, Rfl: 3 .  glimepiride (AMARYL) 2 MG tablet, TAKE 1 TABLET BY MOUTH TWICE DAILY, Disp: 180 tablet, Rfl: 3 .  loratadine (CLARITIN) 10 MG tablet, Take 1 tablet (10 mg total) by mouth daily., Disp: 30 tablet, Rfl: 3 .  losartan-hydrochlorothiazide (HYZAAR) 100-25 MG tablet, Take 1 tablet by mouth daily., Disp: 90 tablet, Rfl: 3 .  metFORMIN (GLUCOPHAGE) 1000 MG tablet, Take 1 tablet (1,000 mg total) by mouth 2 (two) times daily., Disp: 180 tablet, Rfl: 3 .  pregabalin (LYRICA) 50 MG capsule, Take 1 capsule (50 mg total) by mouth 2 (two) times daily., Disp: 60 capsule, Rfl: 1 .  sitaGLIPtin (JANUVIA) 100 MG tablet, Take 1 tablet (100 mg total) by mouth daily., Disp: 90 tablet, Rfl: 3 .  ferrous sulfate 300 (60 Fe) MG/5ML syrup, Take 5  mLs (300 mg total) by mouth daily., Disp: 150 mL, Rfl: 3   No Known Allergies  ROS Review of Systems  Constitutional: Negative.   HENT: Negative.   Eyes: Negative.   Respiratory: Negative.   Cardiovascular: Negative.   Gastrointestinal: Negative.   Endocrine: Negative.   Genitourinary: Negative.   Musculoskeletal: Negative.   Skin: Negative.   Allergic/Immunologic: Negative.   Neurological: Negative.   Hematological: Negative.   Psychiatric/Behavioral: Negative.   All other systems reviewed and are negative.     Objective:    Physical Exam Vitals reviewed.  Constitutional:      Appearance: Normal appearance.  HENT:     Mouth/Throat:     Mouth: Mucous membranes are moist.  Eyes:     Pupils: Pupils are equal, round, and reactive to light.  Neck:     Vascular: No carotid bruit.  Cardiovascular:     Rate and Rhythm: Normal rate and regular rhythm.     Pulses: Normal pulses.     Heart sounds: Normal heart sounds.  Pulmonary:     Effort: Pulmonary effort is normal.     Breath sounds: Normal breath sounds.  Abdominal:     General: Bowel sounds are normal.     Palpations: Abdomen is soft. There is no hepatomegaly, splenomegaly or mass.     Tenderness: There is no abdominal tenderness.     Hernia: No hernia is present.  Musculoskeletal:        General: No tenderness.     Cervical back: Neck supple.     Right lower leg: No edema.     Left lower leg: No edema.  Skin:    Findings: No rash.  Neurological:     General: No focal deficit present.     Mental Status: She is alert and oriented to person, place, and time.     Motor: No weakness.     Comments: Patient has a bruise in the right eye.  There is swelling below the right eye.  Pupils are reactive.   tongue is moist papillated.  Neck is supple.  Patient can walk with the help of a stick.  Psychiatric:        Mood and Affect: Mood and affect normal.        Behavior: Behavior normal.     BP (!) 148/73   Pulse  62   Ht 5\' 4"  (1.626 m)   Wt 238 lb 1.6 oz (108 kg)   BMI 40.87 kg/m  Wt Readings from Last 3 Encounters:  10/04/20 238 lb 1.6 oz (108 kg)  10/02/20 250 lb (113.4 kg)  07/06/20 237 lb (107.5 kg)     Health Maintenance Due  Topic Date Due  . COVID-19 Vaccine (1) Never done  . FOOT EXAM  Never done  . TETANUS/TDAP  Never done  . PNA vac Low Risk Adult (2 of 2 - PCV13) 04/11/2004  . HEMOGLOBIN A1C  05/24/2020    There are no preventive care reminders to display for this patient.  Lab Results  Component Value Date   TSH 0.56 11/23/2019   Lab Results  Component Value Date   WBC 5.7 11/23/2019   HGB 11.1 (L) 11/23/2019   HCT 34.2 (L) 11/23/2019   MCV 96.6 11/23/2019   PLT 214 11/23/2019   Lab Results  Component Value Date   NA 142 11/23/2019   K 3.6 11/23/2019   CO2 23 11/23/2019   GLUCOSE 133 (H) 11/23/2019   BUN 21 11/23/2019   CREATININE 0.87 11/23/2019   BILITOT 0.8 11/23/2019   ALKPHOS 91 09/29/2019   AST 22 11/23/2019   ALT 17 11/23/2019   PROT 6.6 11/23/2019   ALBUMIN 3.8 09/29/2019   CALCIUM 9.6 11/23/2019   ANIONGAP 7 09/29/2019   No results found for: CHOL No results found for: HDL No results found for: LDLCALC No results found for: TRIG No results found for: CHOLHDL Lab Results  Component Value Date   HGBA1C 7.1 (H) 11/23/2019      Assessment & Plan:   Problem List Items Addressed This Visit      Cardiovascular and Mediastinum   Essential hypertension - Primary     Respiratory   Asthma    Asthma is stable at the present time.      Seasonal allergic rhinitis due to pollen    Advised to take Claritin 10 mg p.o. daily.  For allergic rhinitis        Endocrine   Type 2 diabetes mellitus without complication, without long-term current use of insulin ([place])    - The patient's blood sugar is under control on med. - The patient will continue the current treatment regimen.  - I encouraged the patient to regularly check blood sugar.  - I  encouraged the patient to monitor diet. I encouraged the patient to eat low-carb and low-sugar to help prevent blood sugar spikes.  - I encouraged the patient to continue following their prescribed treatment plan for diabetes - I informed the patient to get help if blood sugar drops below 54mg /dL, or if suddenly have trouble thinking clearly or breathing.       Relevant Orders   POCT glucose (manual entry) (Completed)     Other   Hypercholesteremia    Hypercholesterolemia  I advised the patient to follow Mediterranean diet This diet is rich in fruits vegetables and whole grain, and This diet is also rich in fish and lean meat Patient should also eat a handful of almonds or walnuts daily Recent heart study indicated that average follow-up on this kind of diet reduces the cardiovascular mortality by 50 to 70%==      Class 3 severe obesity due to excess calories with serious comorbidity and body mass index (BMI) of 40.0 to 44.9 in adult [facility])    - I encouraged the patient to lose weight.  - I educated them on making healthy dietary choices including eating more fruits and vegetables and less fried foods. - I encouraged the patient to exercise more, and educated on the benefits of exercise including weight loss, diabetes prevention, and hypertension prevention.   Dietary  counseling with a registered dietician  Referral to a weight management support group (e.g. Weight Watchers, Overeaters Anonymous)  If your BMI is greater than 29 or you have gained more than 15 pounds you should work on weight loss.  Attend a healthy cooking class       Head injury    Patient has a head injury at home.  She has swelling of the righteye rea.  No focal neurological signs are seen.  I will check her back in 10 days       Neurological examination is unremarkable.  Also patient will see me back in about 10 days.  Her Wilder Glade has been discontinued by me.  Is making her weak.  She was encouraged to follow  her diet.  Brain: No evidence of acute infarction, hemorrhage, hydrocephalus, extra-axial collection or mass lesion/mass  noted No orders of the defined types were placed in this encounter.   Follow-up: No follow-ups on file.    Cletis Athens, MD

## 2020-10-04 NOTE — Assessment & Plan Note (Signed)
Advised to take Claritin 10 mg p.o. daily.  For allergic rhinitis

## 2020-10-04 NOTE — Assessment & Plan Note (Signed)

## 2020-10-04 NOTE — Assessment & Plan Note (Signed)
Asthma is stable at the present time 

## 2020-10-12 ENCOUNTER — Encounter: Payer: Self-pay | Admitting: Internal Medicine

## 2020-10-12 ENCOUNTER — Other Ambulatory Visit: Payer: Self-pay

## 2020-10-12 ENCOUNTER — Ambulatory Visit (INDEPENDENT_AMBULATORY_CARE_PROVIDER_SITE_OTHER): Payer: Medicare Other | Admitting: Internal Medicine

## 2020-10-12 VITALS — BP 126/80 | HR 90 | Ht 65.0 in | Wt 238.8 lb

## 2020-10-12 DIAGNOSIS — S0990XD Unspecified injury of head, subsequent encounter: Secondary | ICD-10-CM

## 2020-10-12 DIAGNOSIS — I10 Essential (primary) hypertension: Secondary | ICD-10-CM | POA: Diagnosis not present

## 2020-10-12 DIAGNOSIS — E119 Type 2 diabetes mellitus without complications: Secondary | ICD-10-CM | POA: Diagnosis not present

## 2020-10-12 DIAGNOSIS — J301 Allergic rhinitis due to pollen: Secondary | ICD-10-CM | POA: Diagnosis not present

## 2020-10-12 NOTE — Assessment & Plan Note (Signed)
Neurological examination is normal

## 2020-10-12 NOTE — Progress Notes (Signed)
Established Patient Office Visit  Subjective:  Patient ID: Vanessa Vazquez, female    DOB: [birth year]  Age: 86 y.o. MRN: [id number]  CC:  Chief Complaint  Patient presents with  . Hypertension    HPI  Vanessa Vazquez presents for patient came in with a history of fall from the chair she tripped over on the carpet.  After that she was seen in the hospital and she had a CT scan done which was unremarkable.  Patient has a hematoma on the right eye which is getting better.  She denies any chest pain or shortness of breath or swelling of the legs or palpitation. .  She also complains of cramps in the legs so I told her to use tonic water for that.  1 teaspoonful glass of water and take few sips  Past Medical History:  Diagnosis Date  . Asthma   . Diabetes mellitus without complication ([place])   . GERD (gastroesophageal reflux disease)   . Hypercholesteremia   . Hypertension     Past Surgical History:  Procedure Laterality Date  . CESAREAN SECTION    . COLONOSCOPY WITH PROPOFOL N/A 03/19/2016   Procedure: COLONOSCOPY WITH PROPOFOL;  Surgeon: Manya Silvas, MD;  Location: [facility] ENDOSCOPY;  Service: Endoscopy;  Laterality: N/A;    Family History  Problem Relation Age of Onset  . Lung cancer Father   . Leukemia Sister   . Diabetes Sister   . Asthma Child     Social History   Socioeconomic History  . Marital status: Widowed    Spouse name: Not on file  . Number of children: Not on file  . Years of education: Not on file  . Highest education level: Not on file  Occupational History  . Not on file  Tobacco Use  . Smoking status: Former Research scientist (life sciences)  . Smokeless tobacco: Never Used  Substance and Sexual Activity  . Alcohol use: No    Alcohol/week: 0.0 standard drinks  . Drug use: No  . Sexual activity: Never  Other Topics Concern  . Not on file  Social History Narrative  . Not on file   Social Determinants of Health   Financial Resource Strain: Not on file  Food  Insecurity: Not on file  Transportation Needs: Not on file  Physical Activity: Not on file  Stress: Not on file  Social Connections: Not on file  Intimate Partner Violence: Not on file     Current Outpatient Medications:  .  albuterol (VENTOLIN HFA) 108 (90 Base) MCG/ACT inhaler, Inhale 2 puffs into the lungs every 4 (four) hours as needed for wheezing or shortness of breath., Disp: 1 each, Rfl: 0 .  atenolol (TENORMIN) 25 MG tablet, Take 1 tablet (25 mg total) by mouth daily., Disp: 30 tablet, Rfl: 6 .  atorvastatin (LIPITOR) 40 MG tablet, Take 1 tablet (40 mg total) by mouth daily., Disp: 90 tablet, Rfl: 3 .  dapagliflozin propanediol (FARXIGA) 10 MG TABS tablet, Take 1 tablet (10 mg total) by mouth daily., Disp: 90 tablet, Rfl: 3 .  furosemide (LASIX) 20 MG tablet, Take 1 tablet (20 mg total) by mouth every other day., Disp: 30 tablet, Rfl: 3 .  glimepiride (AMARYL) 2 MG tablet, TAKE 1 TABLET BY MOUTH TWICE DAILY, Disp: 180 tablet, Rfl: 3 .  loratadine (CLARITIN) 10 MG tablet, Take 1 tablet (10 mg total) by mouth daily., Disp: 30 tablet, Rfl: 3 .  losartan-hydrochlorothiazide (HYZAAR) 100-25 MG tablet, Take 1 tablet by mouth  daily., Disp: 90 tablet, Rfl: 3 .  metFORMIN (GLUCOPHAGE) 1000 MG tablet, Take 1 tablet (1,000 mg total) by mouth 2 (two) times daily., Disp: 180 tablet, Rfl: 3 .  pregabalin (LYRICA) 50 MG capsule, Take 1 capsule (50 mg total) by mouth 2 (two) times daily., Disp: 60 capsule, Rfl: 1 .  sitaGLIPtin (JANUVIA) 100 MG tablet, Take 1 tablet (100 mg total) by mouth daily., Disp: 90 tablet, Rfl: 3 .  ferrous sulfate 300 (60 Fe) MG/5ML syrup, Take 5 mLs (300 mg total) by mouth daily., Disp: 150 mL, Rfl: 3   No Known Allergies  ROS Review of Systems  Constitutional: Negative.   HENT: Negative.   Eyes: Negative.   Respiratory: Negative.   Cardiovascular: Negative.   Gastrointestinal: Negative.   Endocrine: Negative.   Genitourinary: Negative.   Musculoskeletal:  Negative.   Skin: Negative.   Allergic/Immunologic: Negative.   Neurological: Negative.   Hematological: Negative.   Psychiatric/Behavioral: Negative.        Neurological examination is normal there is a hematoma below the right eye which is getting better.  Eye movements are full.  Neck is supple.  Chest is clear.  Deep tendon reflexes are 1+.  Gait is normal.  There is no weakness of any extremity.  All other systems reviewed and are negative.     Objective:    Physical Exam Vitals reviewed.  Constitutional:      Appearance: Normal appearance.  HENT:     Mouth/Throat:     Mouth: Mucous membranes are moist.  Eyes:     Pupils: Pupils are equal, round, and reactive to light.  Neck:     Vascular: No carotid bruit.  Cardiovascular:     Rate and Rhythm: Normal rate and regular rhythm.     Pulses: Normal pulses.     Heart sounds: Normal heart sounds.  Pulmonary:     Effort: Pulmonary effort is normal.     Breath sounds: Normal breath sounds.  Abdominal:     General: Bowel sounds are normal.     Palpations: Abdomen is soft. There is no hepatomegaly, splenomegaly or mass.     Tenderness: There is no abdominal tenderness.     Hernia: No hernia is present.  Musculoskeletal:        General: No tenderness.     Cervical back: Neck supple.     Right lower leg: No edema.     Left lower leg: No edema.  Skin:    Findings: No rash.  Neurological:     Mental Status: She is alert and oriented to person, place, and time.     Motor: No weakness.  Psychiatric:        Mood and Affect: Mood and affect normal.        Behavior: Behavior normal.    patient does not have any neurological deficit, patient is alert well orientated in all 3 spheres not confused and disorientated neither lethargic. Cranial nerves intact there is no facial asymmetry, no visual field deficit, no dysarthria, Sensations are intact Motor functions are intact without any tremors abnormal tone or atrophy Coordination  intact Romberg negative Gait intact  BP 126/80   Pulse 90   Ht 5\' 5"  (1.651 m)   Wt 238 lb 12.8 oz (108.3 kg)   BMI 39.74 kg/m  Wt Readings from Last 3 Encounters:  10/12/20 238 lb 12.8 oz (108.3 kg)  10/04/20 238 lb 1.6 oz (108 kg)  10/02/20 250 lb (113.4 kg)  Health Maintenance Due  Topic Date Due  . COVID-19 Vaccine (1) Never done  . FOOT EXAM  Never done  . TETANUS/TDAP  Never done  . PNA vac Low Risk Adult (2 of 2 - PCV13) 04/11/2004  . HEMOGLOBIN A1C  05/24/2020    There are no preventive care reminders to display for this patient.  Lab Results  Component Value Date   TSH 0.56 11/23/2019   Lab Results  Component Value Date   WBC 5.7 11/23/2019   HGB 11.1 (L) 11/23/2019   HCT 34.2 (L) 11/23/2019   MCV 96.6 11/23/2019   PLT 214 11/23/2019   Lab Results  Component Value Date   NA 142 11/23/2019   K 3.6 11/23/2019   CO2 23 11/23/2019   GLUCOSE 133 (H) 11/23/2019   BUN 21 11/23/2019   CREATININE 0.87 11/23/2019   BILITOT 0.8 11/23/2019   ALKPHOS 91 09/29/2019   AST 22 11/23/2019   ALT 17 11/23/2019   PROT 6.6 11/23/2019   ALBUMIN 3.8 09/29/2019   CALCIUM 9.6 11/23/2019   ANIONGAP 7 09/29/2019   No results found for: CHOL No results found for: HDL No results found for: LDLCALC No results found for: TRIG No results found for: CHOLHDL Lab Results  Component Value Date   HGBA1C 7.1 (H) 11/23/2019      Assessment & Plan:   Problem List Items Addressed This Visit      Cardiovascular and Mediastinum   Essential hypertension - Primary    Patient blood pressure is normal patient denies any chest pain or shortness of breath there is no history of palpitation or paroxysmal nocturnal dyspnea   patient was advised to follow low-salt low-cholesterol diet    ideally I want to keep systolic blood pressure below 130 mmHg, patient was asked to check blood pressure one times a week and give me a report on that.  Patient will be follow-up in 3 months  or  earlier as needed, patient will call me back for any change in the cardiovascular symptoms           Respiratory   Seasonal allergic rhinitis due to pollen    Take Claritin 10 mg p.o. daily as needed        Endocrine   Type 2 diabetes mellitus without complication, without long-term current use of insulin ([place])    - The patient's blood sugar is under control on med. - The patient will continue the current treatment regimen.  - I encouraged the patient to regularly check blood sugar.  - I encouraged the patient to monitor diet. I encouraged the patient to eat low-carb and low-sugar to help prevent blood sugar spikes.  - I encouraged the patient to continue following their prescribed treatment plan for diabetes - I informed the patient to get help if blood sugar drops below 54mg /dL, or if suddenly have trouble thinking clearly or breathing.         Other   Head injury    Neurological examination is normal         No orders of the defined types were placed in this encounter.   Follow-up: No follow-ups on file.    Cletis Athens, MD

## 2020-10-12 NOTE — Assessment & Plan Note (Signed)
Take Claritin 10 mg p.o. daily as needed 

## 2020-10-12 NOTE — Assessment & Plan Note (Signed)
Patient blood pressure is normal patient denies any chest pain or shortness of breath there is no history of palpitation or paroxysmal nocturnal dyspnea   patient was advised to follow low-salt low-cholesterol diet    ideally I want to keep systolic blood pressure below 130 mmHg, patient was asked to check blood pressure one times a week and give me a report on that.  Patient will be follow-up in 3 months  or earlier as needed, patient will call me back for any change in the cardiovascular symptoms    

## 2020-10-12 NOTE — Assessment & Plan Note (Signed)

## 2020-10-17 DIAGNOSIS — Z20822 Contact with and (suspected) exposure to covid-19: Secondary | ICD-10-CM | POA: Diagnosis not present

## 2020-10-17 DIAGNOSIS — U071 COVID-19: Secondary | ICD-10-CM | POA: Diagnosis not present

## 2020-10-19 ENCOUNTER — Other Ambulatory Visit: Payer: Self-pay

## 2020-10-19 ENCOUNTER — Emergency Department
Admission: EM | Admit: 2020-10-19 | Discharge: 2020-10-19 | Disposition: A | Discharge status: Home / Self Care | Payer: Medicare Other | Attending: Emergency Medicine | Admitting: Emergency Medicine

## 2020-10-19 ENCOUNTER — Emergency Department: Payer: Medicare Other

## 2020-10-19 DIAGNOSIS — Z87891 Personal history of nicotine dependence: Secondary | ICD-10-CM | POA: Insufficient documentation

## 2020-10-19 DIAGNOSIS — U071 COVID-19: Secondary | ICD-10-CM | POA: Insufficient documentation

## 2020-10-19 DIAGNOSIS — E119 Type 2 diabetes mellitus without complications: Secondary | ICD-10-CM | POA: Insufficient documentation

## 2020-10-19 DIAGNOSIS — E1165 Type 2 diabetes mellitus with hyperglycemia: Secondary | ICD-10-CM | POA: Diagnosis not present

## 2020-10-19 DIAGNOSIS — J45909 Unspecified asthma, uncomplicated: Secondary | ICD-10-CM | POA: Insufficient documentation

## 2020-10-19 DIAGNOSIS — Z7984 Long term (current) use of oral hypoglycemic drugs: Secondary | ICD-10-CM | POA: Diagnosis not present

## 2020-10-19 DIAGNOSIS — R531 Weakness: Secondary | ICD-10-CM | POA: Diagnosis present

## 2020-10-19 DIAGNOSIS — Z743 Need for continuous supervision: Secondary | ICD-10-CM | POA: Diagnosis not present

## 2020-10-19 DIAGNOSIS — I10 Essential (primary) hypertension: Secondary | ICD-10-CM | POA: Insufficient documentation

## 2020-10-19 DIAGNOSIS — Z79899 Other long term (current) drug therapy: Secondary | ICD-10-CM | POA: Insufficient documentation

## 2020-10-19 DIAGNOSIS — R0602 Shortness of breath: Secondary | ICD-10-CM

## 2020-10-19 DIAGNOSIS — I499 Cardiac arrhythmia, unspecified: Secondary | ICD-10-CM | POA: Diagnosis not present

## 2020-10-19 LAB — CBC WITH DIFFERENTIAL/PLATELET
Abs Immature Granulocytes: 0.01 10*3/uL (ref 0.00–0.07)
Basophils Absolute: 0 10*3/uL (ref 0.0–0.1)
Basophils Relative: 1 %
Eosinophils Absolute: 0 10*3/uL (ref 0.0–0.5)
Eosinophils Relative: 1 %
HCT: 36.7 % (ref 36.0–46.0)
Hemoglobin: 12.1 g/dL (ref 12.0–15.0)
Immature Granulocytes: 0 %
Lymphocytes Relative: 44 %
Lymphs Abs: 1.5 10*3/uL (ref 0.7–4.0)
MCH: 31.6 pg (ref 26.0–34.0)
MCHC: 33 g/dL (ref 30.0–36.0)
MCV: 95.8 fL (ref 80.0–100.0)
Monocytes Absolute: 0.4 10*3/uL (ref 0.1–1.0)
Monocytes Relative: 12 %
Neutro Abs: 1.4 10*3/uL — ABNORMAL LOW (ref 1.7–7.7)
Neutrophils Relative %: 42 %
Platelets: 188 10*3/uL (ref 150–400)
RBC: 3.83 MIL/uL — ABNORMAL LOW (ref 3.87–5.11)
RDW: 12.1 % (ref 11.5–15.5)
WBC: 3.4 10*3/uL — ABNORMAL LOW (ref 4.0–10.5)
nRBC: 0 % (ref 0.0–0.2)

## 2020-10-19 LAB — RESP PANEL BY RT-PCR (FLU A&B, COVID) ARPGX2
Influenza A by PCR: NEGATIVE
Influenza B by PCR: NEGATIVE
SARS Coronavirus 2 by RT PCR: POSITIVE — AB

## 2020-10-19 LAB — TROPONIN I (HIGH SENSITIVITY): Troponin I (High Sensitivity): 8 ng/L (ref <18)

## 2020-10-19 LAB — BASIC METABOLIC PANEL
Anion gap: 8 (ref 5–15)
BUN: 18 mg/dL (ref 8–23)
CO2: 26 mmol/L (ref 22–32)
Calcium: 8.9 mg/dL (ref 8.9–10.3)
Chloride: 104 mmol/L (ref 98–111)
Creatinine, Ser: 0.8 mg/dL (ref 0.44–1.00)
GFR, Estimated: >60 mL/min (ref >60)
Glucose, Bld: 258 mg/dL — ABNORMAL HIGH (ref 70–99)
Potassium: 3.9 mmol/L (ref 3.5–5.1)
Sodium: 138 mmol/L (ref 135–145)

## 2020-10-19 MED ORDER — ACETAMINOPHEN 500 MG PO TABS
1000.0000 mg | ORAL_TABLET | Freq: Once | ORAL | Status: AC
  Administered 2020-10-19: 1000 mg via ORAL
  Filled 2020-10-19: qty 2

## 2020-10-19 MED ORDER — ALBUTEROL SULFATE HFA 108 (90 BASE) MCG/ACT IN AERS
2.0000 | INHALATION_SPRAY | Freq: Once | RESPIRATORY_TRACT | Status: DC | PRN
  Filled 2020-10-19: qty 6.7

## 2020-10-19 MED ORDER — EPINEPHRINE 0.3 MG/0.3ML IJ SOAJ: 0.3000 mg | Freq: Once | INTRAMUSCULAR | Status: DC | PRN

## 2020-10-19 MED ORDER — ALBUTEROL SULFATE HFA 108 (90 BASE) MCG/ACT IN AERS
2.0000 | INHALATION_SPRAY | RESPIRATORY_TRACT | Status: DC | PRN
  Filled 2020-10-19 (×2): qty 6.7

## 2020-10-19 MED ORDER — DIPHENHYDRAMINE HCL 50 MG/ML IJ SOLN: 50.0000 mg | Freq: Once | INTRAMUSCULAR | Status: DC | PRN

## 2020-10-19 MED ORDER — METHYLPREDNISOLONE SODIUM SUCC 125 MG IJ SOLR: 125.0000 mg | Freq: Once | INTRAMUSCULAR | Status: DC | PRN

## 2020-10-19 MED ORDER — SODIUM CHLORIDE 0.9 % IV SOLN: INTRAVENOUS | Status: DC | PRN

## 2020-10-19 MED ORDER — BEBTELOVIMAB 175 MG/2 ML IV (EUA)
175.0000 mg | Freq: Once | INTRAMUSCULAR | Status: AC
  Administered 2020-10-19: 175 mg via INTRAVENOUS
  Filled 2020-10-19: qty 2

## 2020-10-19 MED ORDER — FAMOTIDINE IN NACL 20-0.9 MG/50ML-% IV SOLN: 20.0000 mg | Freq: Once | INTRAVENOUS | Status: DC | PRN

## 2020-10-19 NOTE — ED Provider Notes (Signed)
[facility] Emergency Department Provider Note ____________________________________________   Event Date/Time   First MD Initiated Contact with Patient 10/19/20 1033     (approximate)  I have reviewed the triage vital signs and the nursing notes.  HISTORY  Chief Complaint Shortness of Breath   HPI Vanessa Vazquez is a 86 y.o. femalewho presents to the ED for evaluation of weakness.   Chart review indicates obesity, HTN, HLD, DM, GERD.  Never vaccinated for COVID-19.  Patient reports symptoms started about 8 days ago with a headache.  She reports this headache is improved, but she has felt generalized weakness and "just feeling awful" for the past 1 week.  Patient is largely reporting generalized symptoms such as weakness, and minimizes her shortness of breath.  Reports feeling "a little bit winded" but denies chest pain, syncope, abdominal pain, emesis, diarrhea or stool changes, dysuria.   Past Medical History:  Diagnosis Date  . Asthma   . Diabetes mellitus without complication ([place])   . GERD (gastroesophageal reflux disease)   . Hypercholesteremia   . Hypertension     Patient Active Problem List   Diagnosis Date Noted  . Head injury 10/04/2020  . Fatigue 02/11/2020  . Iron deficiency anemia secondary to inadequate dietary iron intake 01/06/2020  . Annual physical exam 01/06/2020  . Class 3 severe obesity due to excess calories with serious comorbidity and body mass index (BMI) of 40.0 to 44.9 in adult ([place]) 11/23/2019  . Seasonal allergic rhinitis due to pollen 10/21/2019  . Type 2 diabetes mellitus without complication, without long-term current use of insulin ([place]) 10/21/2019  . GI bleed 03/17/2016  . Vaginal odor 12/14/2015  . Vulvar itching 12/14/2015  . Vaginal leukorrhea 12/14/2015  . Essential hypertension   . Hypercholesteremia   . Asthma     Past Surgical History:  Procedure Laterality Date  . CESAREAN SECTION    . COLONOSCOPY  WITH PROPOFOL N/A 03/19/2016   Procedure: COLONOSCOPY WITH PROPOFOL;  Surgeon: Manya Silvas, MD;  Location: [facility] ENDOSCOPY;  Service: Endoscopy;  Laterality: N/A;    Prior to Admission medications   Medication Sig Start Date End Date Taking? Authorizing Provider  albuterol (VENTOLIN HFA) 108 (90 Base) MCG/ACT inhaler Inhale 2 puffs into the lungs every 4 (four) hours as needed for wheezing or shortness of breath. 09/13/20   Cletis Athens, MD  atenolol (TENORMIN) 25 MG tablet Take 1 tablet (25 mg total) by mouth daily. 02/17/20   Cletis Athens, MD  atorvastatin (LIPITOR) 40 MG tablet Take 1 tablet (40 mg total) by mouth daily. 09/12/20   Cletis Athens, MD  dapagliflozin propanediol (FARXIGA) 10 MG TABS tablet Take 1 tablet (10 mg total) by mouth daily. 08/11/20   Cletis Athens, MD  ferrous sulfate 300 (60 Fe) MG/5ML syrup Take 5 mLs (300 mg total) by mouth daily. 01/06/20 02/05/20  Cletis Athens, MD  furosemide (LASIX) 20 MG tablet Take 1 tablet (20 mg total) by mouth every other day. 09/13/20   Cletis Athens, MD  glimepiride (AMARYL) 2 MG tablet TAKE 1 TABLET BY MOUTH TWICE DAILY 10/15/19   Cletis Athens, MD  loratadine (CLARITIN) 10 MG tablet Take 1 tablet (10 mg total) by mouth daily. 09/13/20   Cletis Athens, MD  losartan-hydrochlorothiazide (HYZAAR) 100-25 MG tablet Take 1 tablet by mouth daily. 04/15/20   Cletis Athens, MD  metFORMIN (GLUCOPHAGE) 1000 MG tablet Take 1 tablet (1,000 mg total) by mouth 2 (two) times daily. 04/15/20   Cletis Athens,  MD  pregabalin (LYRICA) 50 MG capsule Take 1 capsule (50 mg total) by mouth 2 (two) times daily. 06/16/20   Cletis Athens, MD  sitaGLIPtin (JANUVIA) 100 MG tablet Take 1 tablet (100 mg total) by mouth daily. 04/04/20   Cletis Athens, MD    Allergies Patient has no known allergies.  Family History  Problem Relation Age of Onset  . Lung cancer Father   . Leukemia Sister   . Diabetes Sister   . Asthma Child     Social History Social History    Tobacco Use  . Smoking status: Former Research scientist (life sciences)  . Smokeless tobacco: Never Used  Substance Use Topics  . Alcohol use: No    Alcohol/week: 0.0 standard drinks  . Drug use: No    Review of Systems  Constitutional: No fever/chills.  Positive generalized weakness Eyes: No visual changes. ENT: No sore throat. Cardiovascular: Denies chest pain. Respiratory: Positive shortness of breath. Gastrointestinal: No abdominal pain.  No nausea, no vomiting.  No diarrhea.  No constipation. Genitourinary: Negative for dysuria. Musculoskeletal: Negative for back pain. Skin: Negative for rash. Neurological: Negative for headaches, focal weakness or numbness.  ____________________________________________   PHYSICAL EXAM:  VITAL SIGNS: Vitals:   10/19/20 1415 10/19/20 1419  BP: (!) 122/59   Pulse: 65   Resp: 12   Temp:  98.3 F (36.8 C)  SpO2: 95%      Constitutional: Alert and oriented. Well appearing and in no acute distress. Eyes: Conjunctivae are normal. PERRL. EOMI. Head: Atraumatic. Nose: No congestion/rhinnorhea. Mouth/Throat: Mucous membranes are moist.  Oropharynx non-erythematous. Neck: No stridor. No cervical spine tenderness to palpation. Cardiovascular: Normal rate, regular rhythm. Grossly normal heart sounds.  Good peripheral circulation. Respiratory: Normal respiratory effort.  No retractions. Lungs CTAB. Gastrointestinal: Soft , nondistended, nontender to palpation. No CVA tenderness. Musculoskeletal: No lower extremity tenderness nor edema.  No joint effusions. No signs of acute trauma. Neurologic:  Normal speech and language. No gross focal neurologic deficits are appreciated. No gait instability noted. Skin:  Skin is warm, dry and intact. No rash noted. Psychiatric: Mood and affect are normal. Speech and behavior are normal.  ____________________________________________   LABS (all labs ordered are listed, but only abnormal results are displayed)  Labs Reviewed   RESP PANEL BY RT-PCR (FLU A&B, COVID) ARPGX2 - Abnormal; Notable for the following components:      Result Value   SARS Coronavirus 2 by RT PCR POSITIVE (*)    All other components within normal limits  CBC WITH DIFFERENTIAL/PLATELET - Abnormal; Notable for the following components:   WBC 3.4 (*)    RBC 3.83 (*)    Neutro Abs 1.4 (*)    All other components within normal limits  BASIC METABOLIC PANEL - Abnormal; Notable for the following components:   Glucose, Bld 258 (*)    All other components within normal limits  TROPONIN I (HIGH SENSITIVITY)  TROPONIN I (HIGH SENSITIVITY)   ____________________________________________  12 Lead EKG  Sinus rhythm, rate of 86 bpm.  Normal axis.  Left bundle branch block and no evidence of acute ischemia. ____________________________________________  RADIOLOGY  ED MD interpretation: 2 views CXR reviewed by me without evidence of acute cardiopulmonary pathology.  Official radiology report(s): DG Chest 2 View  Result Date: 10/19/2020 CLINICAL DATA:  Shortness of breath EXAM: CHEST - 2 VIEW COMPARISON:  09/29/2019 FINDINGS: Normal heart size and stable mediastinal contours. There is no edema, consolidation, effusion, or pneumothorax. IMPRESSION: No evidence of acute disease. Electronically Signed  By: Monte Fantasia M.D.   On: 10/19/2020 11:17    ____________________________________________   PROCEDURES and INTERVENTIONS  Procedure(s) performed (including Critical Care):  .1-3 Lead EKG Interpretation Performed by: Vladimir Crofts, MD Authorized by: Vladimir Crofts, MD     Interpretation: normal     ECG rate:  68   ECG rate assessment: normal     Rhythm: sinus rhythm     Ectopy: none     Conduction: normal      Medications  0.9 %  sodium chloride infusion (has no administration in time range)  diphenhydrAMINE (BENADRYL) injection 50 mg (has no administration in time range)  famotidine (PEPCID) IVPB 20 mg premix (has no administration  in time range)  methylPREDNISolone sodium succinate (SOLU-MEDROL) 125 mg/2 mL injection 125 mg (has no administration in time range)  albuterol (VENTOLIN HFA) 108 (90 Base) MCG/ACT inhaler 2 puff (has no administration in time range)  EPINEPHrine (EPI-PEN) injection 0.3 mg (has no administration in time range)  acetaminophen (TYLENOL) tablet 1,000 mg (1,000 mg Oral Given 10/19/20 1208)  bebtelovimab EUA injection SOLN 175 mg (175 mg Intravenous Given 10/19/20 1257)    ____________________________________________   MDM / ED COURSE   Unvaccinated 86 year old woman presents to the ED with about 1 week of COVID-19 symptoms, minimal to monoclonal antibody infusion and outpatient management.  Normal vitals on room air.  Exam without distress, neurologic or vascular deficits.  She looks well clinically.  Work-up without evidence of superimposed pneumonia, DKA, renal failure further medical pathology.  Due to her symptoms being less than 10 days, by protocol, she received monoclonal antibody infusion and was observed for greater than 1 hour after this.  No evidence of allergic reaction.  Continues to be not hypoxic and looks well.  We will discharge with return precautions.   Clinical Course as of 10/19/20 1531  Wed Oct 19, 2020  1118 Discussed with the patient, and daughter at the bedside, that she qualifies for monoclonal antibody infusion.  We discussed risk and benefits, and patient is agreeable and wishes to move forward with Mab infusion while she is here. [DS]  [id number] after infusion complete.  Patient reports feeling okay.  No hypoxia.  We discussed outpatient management and return precautions for the ED. Answered questions. [DS]    Clinical Course User Index [DS] Vladimir Crofts, MD    ____________________________________________   FINAL CLINICAL IMPRESSION(S) / ED DIAGNOSES  Final diagnoses:  COVID-19  Shortness of breath  Generalized weakness     ED Discharge Orders     None       Kyshaun Barnette Tamala Julian   Note:  This document was prepared using Dragon voice recognition software and may include unintentional dictation errors.   Vladimir Crofts, MD 10/19/20 [phone]

## 2020-10-19 NOTE — ED Notes (Signed)
Pt back from xray. Daughter at bedside. EDP on way to evaluate pt.

## 2020-10-19 NOTE — ED Triage Notes (Signed)
Pt to ED from home, [facility] C/o SOB, tested positive for covid 2d ago EMS states was not able to speak on full sentences when they arrived, lungs clear. RR was 34 when EMS arrived  Hx DM2, did not take metformin today. Takes Lasix every other day, did not take past few days  337 CBG, other VS wnl LBBB on EKG, 95% on RA 22g IV L wrist  Pt does not appear SOB at this time or in any distress

## 2020-10-19 NOTE — Discharge Instructions (Signed)
Use Tylenol for pain and fevers.  Up to 1000 mg per dose, up to 4 times per day.  Do not take more than 4000 mg of Tylenol/acetaminophen within 24 hours..  You received monoclonal antibody infusion while you are in the ED.  Please follow-up with your PCP in the next week.  Return to the ED with any further worsening symptoms, difficulty breathing, passing out

## 2020-10-20 ENCOUNTER — Other Ambulatory Visit: Payer: Self-pay

## 2020-10-20 ENCOUNTER — Emergency Department: Payer: Medicare Other

## 2020-10-20 ENCOUNTER — Emergency Department
Admission: EM | Admit: 2020-10-20 | Discharge: 2020-10-20 | Disposition: A | Discharge status: Home / Self Care | Payer: Medicare Other | Attending: Emergency Medicine | Admitting: Emergency Medicine

## 2020-10-20 DIAGNOSIS — Z79899 Other long term (current) drug therapy: Secondary | ICD-10-CM | POA: Diagnosis not present

## 2020-10-20 DIAGNOSIS — I10 Essential (primary) hypertension: Secondary | ICD-10-CM | POA: Insufficient documentation

## 2020-10-20 DIAGNOSIS — E119 Type 2 diabetes mellitus without complications: Secondary | ICD-10-CM | POA: Diagnosis not present

## 2020-10-20 DIAGNOSIS — U071 COVID-19: Secondary | ICD-10-CM | POA: Diagnosis not present

## 2020-10-20 DIAGNOSIS — R0602 Shortness of breath: Secondary | ICD-10-CM

## 2020-10-20 DIAGNOSIS — Z7984 Long term (current) use of oral hypoglycemic drugs: Secondary | ICD-10-CM | POA: Diagnosis not present

## 2020-10-20 DIAGNOSIS — J45909 Unspecified asthma, uncomplicated: Secondary | ICD-10-CM | POA: Insufficient documentation

## 2020-10-20 DIAGNOSIS — R531 Weakness: Secondary | ICD-10-CM | POA: Diagnosis not present

## 2020-10-20 DIAGNOSIS — Z743 Need for continuous supervision: Secondary | ICD-10-CM | POA: Diagnosis not present

## 2020-10-20 DIAGNOSIS — Z87891 Personal history of nicotine dependence: Secondary | ICD-10-CM | POA: Insufficient documentation

## 2020-10-20 DIAGNOSIS — R0902 Hypoxemia: Secondary | ICD-10-CM | POA: Diagnosis not present

## 2020-10-20 LAB — CBC WITH DIFFERENTIAL/PLATELET
Abs Immature Granulocytes: 0.01 10*3/uL (ref 0.00–0.07)
Basophils Absolute: 0 10*3/uL (ref 0.0–0.1)
Basophils Relative: 0 %
Eosinophils Absolute: 0 10*3/uL (ref 0.0–0.5)
Eosinophils Relative: 0 %
HCT: 38.3 % (ref 36.0–46.0)
Hemoglobin: 12.7 g/dL (ref 12.0–15.0)
Immature Granulocytes: 0 %
Lymphocytes Relative: 35 %
Lymphs Abs: 1.8 10*3/uL (ref 0.7–4.0)
MCH: 31.3 pg (ref 26.0–34.0)
MCHC: 33.2 g/dL (ref 30.0–36.0)
MCV: 94.3 fL (ref 80.0–100.0)
Monocytes Absolute: 0.5 10*3/uL (ref 0.1–1.0)
Monocytes Relative: 10 %
Neutro Abs: 2.7 10*3/uL (ref 1.7–7.7)
Neutrophils Relative %: 55 %
Platelets: 221 10*3/uL (ref 150–400)
RBC: 4.06 MIL/uL (ref 3.87–5.11)
RDW: 11.9 % (ref 11.5–15.5)
WBC: 5 10*3/uL (ref 4.0–10.5)
nRBC: 0 % (ref 0.0–0.2)

## 2020-10-20 LAB — BASIC METABOLIC PANEL
Anion gap: 12 (ref 5–15)
BUN: 12 mg/dL (ref 8–23)
CO2: 23 mmol/L (ref 22–32)
Calcium: 9.6 mg/dL (ref 8.9–10.3)
Chloride: 102 mmol/L (ref 98–111)
Creatinine, Ser: 0.84 mg/dL (ref 0.44–1.00)
GFR, Estimated: >60 mL/min (ref >60)
Glucose, Bld: 178 mg/dL — ABNORMAL HIGH (ref 70–99)
Potassium: 3.8 mmol/L (ref 3.5–5.1)
Sodium: 137 mmol/L (ref 135–145)

## 2020-10-20 NOTE — ED Notes (Addendum)
Patient reports increased weakness, SOB and difficulty breathing with activity since Monday. Patient reports dx with COVID. Patient reports she was seen in the ED yesterday for same. Patient reports IM injection yesterday. Patient is noted to have shortness of breath talking. Sat while talking remains >95%. Patient reports she lives with her son, and does not require extra help with ADLs. Patient reports "I'm just so weak."

## 2020-10-20 NOTE — ED Notes (Signed)
Patient heard from nurse's station yelling for help. This RN to bedside. Patient noted to have call light within reach. No acute distress noted. When this RN asked patient what she needs help with, patient expressing concern about not having eaten breakfast. Patient ok to eat per Dr. Cheri Fowler, food and drinks provided to patient. Remote, and warm blanket provided, lights dimmed for patient comfort. Patient verbalized understanding of call light use. Patient denies further needs.

## 2020-10-20 NOTE — ED Notes (Signed)
Patient requesting more food. Patient provided meal tray. Dr. Cheri Fowler reports patient to be discharged. Patient notified to contact her ride home.

## 2020-10-20 NOTE — ED Triage Notes (Signed)
Pt comes into the ED via [facility] from home c/o increased weakness, decreased oral intake, and increased SHOB.  Pt dx with COVID on Monday.  Pt was seen here yesterday and d/c home. VSS with EMS

## 2020-10-20 NOTE — ED Provider Notes (Signed)
[facility] Emergency Department Provider Note   ____________________________________________   Event Date/Time   First MD Initiated Contact with Patient 10/20/20 1741     (approximate)  I have reviewed the triage vital signs and the nursing notes.   HISTORY  Chief Complaint COVID +    HPI Vanessa Vazquez is a 86 y.o. female with the below stated past medical history and recent diagnosis of COVID who presents for increasing weakness, shortness of breath, and cough that has been worsening over the past 4 days.  Patient was seen here yesterday and provided with monoclonal antibody infusion prior to discharge.  Patient currently denies any vision changes, tinnitus, difficulty speaking, facial droop, sore throat, chest pain, abdominal pain, nausea/vomiting/diarrhea, dysuria, or numbness/paresthesias in any extremity         Past Medical History:  Diagnosis Date  . Asthma   . Diabetes mellitus without complication ([place])   . GERD (gastroesophageal reflux disease)   . Hypercholesteremia   . Hypertension     Patient Active Problem List   Diagnosis Date Noted  . Head injury 10/04/2020  . Fatigue 02/11/2020  . Iron deficiency anemia secondary to inadequate dietary iron intake 01/06/2020  . Annual physical exam 01/06/2020  . Class 3 severe obesity due to excess calories with serious comorbidity and body mass index (BMI) of 40.0 to 44.9 in adult ([place]) 11/23/2019  . Seasonal allergic rhinitis due to pollen 10/21/2019  . Type 2 diabetes mellitus without complication, without long-term current use of insulin ([place]) 10/21/2019  . GI bleed 03/17/2016  . Vaginal odor 12/14/2015  . Vulvar itching 12/14/2015  . Vaginal leukorrhea 12/14/2015  . Essential hypertension   . Hypercholesteremia   . Asthma     Past Surgical History:  Procedure Laterality Date  . CESAREAN SECTION    . COLONOSCOPY WITH PROPOFOL N/A 03/19/2016   Procedure: COLONOSCOPY WITH PROPOFOL;   Surgeon: Manya Silvas, MD;  Location: [facility] ENDOSCOPY;  Service: Endoscopy;  Laterality: N/A;    Prior to Admission medications   Medication Sig Start Date End Date Taking? Authorizing Provider  albuterol (VENTOLIN HFA) 108 (90 Base) MCG/ACT inhaler Inhale 2 puffs into the lungs every 4 (four) hours as needed for wheezing or shortness of breath. 09/13/20   Cletis Athens, MD  atenolol (TENORMIN) 25 MG tablet Take 1 tablet (25 mg total) by mouth daily. 02/17/20   Cletis Athens, MD  atorvastatin (LIPITOR) 40 MG tablet Take 1 tablet (40 mg total) by mouth daily. 09/12/20   Cletis Athens, MD  dapagliflozin propanediol (FARXIGA) 10 MG TABS tablet Take 1 tablet (10 mg total) by mouth daily. 08/11/20   Cletis Athens, MD  ferrous sulfate 300 (60 Fe) MG/5ML syrup Take 5 mLs (300 mg total) by mouth daily. 01/06/20 02/05/20  Cletis Athens, MD  furosemide (LASIX) 20 MG tablet Take 1 tablet (20 mg total) by mouth every other day. 09/13/20   Cletis Athens, MD  glimepiride (AMARYL) 2 MG tablet TAKE 1 TABLET BY MOUTH TWICE DAILY 10/15/19   Cletis Athens, MD  loratadine (CLARITIN) 10 MG tablet Take 1 tablet (10 mg total) by mouth daily. 09/13/20   Cletis Athens, MD  losartan-hydrochlorothiazide (HYZAAR) 100-25 MG tablet Take 1 tablet by mouth daily. 04/15/20   Cletis Athens, MD  metFORMIN (GLUCOPHAGE) 1000 MG tablet Take 1 tablet (1,000 mg total) by mouth 2 (two) times daily. 04/15/20   Cletis Athens, MD  pregabalin (LYRICA) 50 MG capsule Take 1 capsule (50 mg total)  by mouth 2 (two) times daily. 06/16/20   Cletis Athens, MD  sitaGLIPtin (JANUVIA) 100 MG tablet Take 1 tablet (100 mg total) by mouth daily. 04/04/20   Cletis Athens, MD    Allergies Patient has no known allergies.  Family History  Problem Relation Age of Onset  . Lung cancer Father   . Leukemia Sister   . Diabetes Sister   . Asthma Child     Social History Social History   Tobacco Use  . Smoking status: Former Research scientist (life sciences)  . Smokeless tobacco:  Never Used  Substance Use Topics  . Alcohol use: No    Alcohol/week: 0.0 standard drinks  . Drug use: No    Review of Systems Constitutional: No fever/chills.  Endorses decreased p.o. intake and decreased appetite Eyes: No visual changes. ENT: No sore throat. Cardiovascular: Denies chest pain. Respiratory: Endorses shortness of breath. Gastrointestinal: No abdominal pain.  No nausea, no vomiting.  No diarrhea. Genitourinary: Negative for dysuria. Musculoskeletal: Negative for acute arthralgias Skin: Negative for rash. Neurological: Endorses generalized weakness.  Negative for headaches, numbness/paresthesias in any extremity Psychiatric: Negative for suicidal ideation/homicidal ideation   ____________________________________________   PHYSICAL EXAM:  VITAL SIGNS: ED Triage Vitals [10/20/20 1449]  Enc Vitals Group     BP (!) 116/59     Pulse Rate 63     Resp 20     Temp 98.4 F (36.9 C)     Temp Source Oral     SpO2 97 %     Weight 235 lb 14.3 oz (107 kg)     Height 5\' 4"  (1.626 m)     Head Circumference      Peak Flow      Pain Score 0     Pain Loc      Pain Edu?      Excl. in [place]?    Constitutional: Alert and oriented. Well appearing and in no acute distress. Eyes: Conjunctivae are normal. PERRL. Head: Atraumatic. Nose: No congestion/rhinnorhea. Mouth/Throat: Mucous membranes are moist. Neck: No stridor Cardiovascular: Grossly normal heart sounds.  Good peripheral circulation. Respiratory: Normal respiratory effort.  No retractions.  Clear to auscultation bilaterally Gastrointestinal: Soft and nontender. No distention. Musculoskeletal: No obvious deformities Neurologic:  Normal speech and language. No gross focal neurologic deficits are appreciated. Skin:  Skin is warm and dry. No rash noted. Psychiatric: Mood and affect are normal. Speech and behavior are normal.  ____________________________________________   LABS (all labs ordered are listed, but only  abnormal results are displayed)  Labs Reviewed  BASIC METABOLIC PANEL - Abnormal; Notable for the following components:      Result Value   Glucose, Bld 178 (*)    All other components within normal limits  CBC WITH DIFFERENTIAL/PLATELET   ____________________________________________  EKG  ED ECG REPORT I, Naaman Plummer, the attending physician, personally viewed and interpreted this ECG.  Date: 10/20/2020 EKG Time: 1451 Rate: 65 Rhythm: normal sinus rhythm QRS Axis: normal Intervals: normal ST/T Wave abnormalities: normal Narrative Interpretation: no evidence of acute ischemia  ____________________________________________  RADIOLOGY  ED MD interpretation: One-view portable chest x-ray shows no evidence of acute abnormalities including no pneumonia, pneumothorax, or widened mediastinum  Official radiology report(s): DG Chest Port 1 View  Result Date: 10/20/2020 CLINICAL DATA:  Shortness of breath EXAM: PORTABLE CHEST 1 VIEW COMPARISON:  10/19/2020 FINDINGS: The heart size and mediastinal contours are within normal limits. Aortic atherosclerosis. Both lungs are clear. The visualized skeletal structures are unremarkable. IMPRESSION: No active  disease. Electronically Signed   By: Donavan Foil M.D.   On: 10/20/2020 17:27    ____________________________________________   PROCEDURES  Procedure(s) performed (including Critical Care):  .1-3 Lead EKG Interpretation Performed by: Naaman Plummer, MD Authorized by: Naaman Plummer, MD     Interpretation: normal     ECG rate:  82   ECG rate assessment: normal     Rhythm: sinus rhythm     Ectopy: none     Conduction: normal       ____________________________________________   INITIAL IMPRESSION / ASSESSMENT AND PLAN / ED COURSE  As part of my medical decision making, I reviewed the following data within the [place] notes reviewed and incorporated, Labs reviewed, EKG interpreted, Old  chart reviewed, Radiograph reviewed and Notes from prior ED visits reviewed and incorporated        Presentation most consistent with Viral Syndrome.  Patient has tested positive for COVID-19. Based on vitals and exam they are nontoxic and stable for discharge.  Given History and Exam I have a lower suspicion for: Emergent CardioPulmonary causes [such as Acute Asthma or COPD Exacerbation, acute Heart Failure or exacerbation, PE, PTX, atypical ACS, PNA]. Emergent Otolaryngeal causes [such as PTA, RPA, [place], Epiglottitis, EBV].  Regarding Emergent Travel or Immunosuppressive related infectious: I have a low suspicion for acute HIV.  Will provide strict return precautions and instructions on self-isolation/quarantine and anticipatory guidance.      ____________________________________________   FINAL CLINICAL IMPRESSION(S) / ED DIAGNOSES  Final diagnoses:  COVID-19 virus infection  Shortness of breath  Generalized weakness     ED Discharge Orders    None       Note:  This document was prepared using Dragon voice recognition software and may include unintentional dictation errors.   Naaman Plummer, MD 10/20/20 [id number]

## 2020-10-20 NOTE — ED Triage Notes (Signed)
Discussed pt with Dr Tamala Julian, verbal orders

## 2020-10-20 NOTE — ED Notes (Signed)
ED Provider at bedside. 

## 2020-10-20 NOTE — ED Notes (Signed)
Discharge instructions discussed with pt.. pt verbalized understanding with no questions at this time. Pt wheelchair to car. Pt stable to go home with family

## 2020-10-31 ENCOUNTER — Other Ambulatory Visit: Payer: Self-pay | Admitting: *Deleted

## 2020-10-31 ENCOUNTER — Ambulatory Visit: Payer: Medicare Other | Admitting: Internal Medicine

## 2020-10-31 MED ORDER — PREGABALIN 50 MG PO CAPS
50.0000 mg | ORAL_CAPSULE | Freq: Two times a day (BID) | ORAL | 1 refills | Status: DC
Start: 2020-10-31 — End: 2020-11-02

## 2020-11-02 ENCOUNTER — Other Ambulatory Visit: Payer: Self-pay

## 2020-11-02 ENCOUNTER — Ambulatory Visit (INDEPENDENT_AMBULATORY_CARE_PROVIDER_SITE_OTHER): Payer: Medicare Other | Admitting: Internal Medicine

## 2020-11-02 ENCOUNTER — Encounter: Payer: Self-pay | Admitting: Internal Medicine

## 2020-11-02 VITALS — Ht 64.0 in | Wt 223.7 lb

## 2020-11-02 DIAGNOSIS — E1169 Type 2 diabetes mellitus with other specified complication: Secondary | ICD-10-CM | POA: Diagnosis not present

## 2020-11-02 DIAGNOSIS — I10 Essential (primary) hypertension: Secondary | ICD-10-CM | POA: Diagnosis not present

## 2020-11-02 DIAGNOSIS — E119 Type 2 diabetes mellitus without complications: Secondary | ICD-10-CM

## 2020-11-02 DIAGNOSIS — E66813 Obesity, class 3: Secondary | ICD-10-CM

## 2020-11-02 DIAGNOSIS — R5383 Other fatigue: Secondary | ICD-10-CM

## 2020-11-02 DIAGNOSIS — Z6841 Body Mass Index (BMI) 40.0 and over, adult: Secondary | ICD-10-CM

## 2020-11-02 MED ORDER — NEBULIZER DEVI: 1.0000 | Freq: Two times a day (BID) | 0 refills | Status: AC | PRN

## 2020-11-02 MED ORDER — PREGABALIN 50 MG PO CAPS: 50.0000 mg | ORAL_CAPSULE | Freq: Two times a day (BID) | ORAL | 1 refills | Status: DC

## 2020-11-04 NOTE — Assessment & Plan Note (Signed)
I told the patient that we will need to recheck her blood sugar on a regular basis and hemoglobin AIC at least every 3 months.  She was advised to lose weight and avoid soft drinks

## 2020-11-04 NOTE — Assessment & Plan Note (Signed)
Blood pressure stable at the present time 

## 2020-11-04 NOTE — Assessment & Plan Note (Signed)
Patient has generalized fatigue due to chronic disease diabetes hypertension and arthritis

## 2020-11-04 NOTE — Assessment & Plan Note (Signed)

## 2020-11-04 NOTE — Progress Notes (Signed)
Established Patient Office Visit  Subjective:  Patient ID: Vanessa Vazquez, female    DOB: Aug 10, 1934  Age: 86 y.o. MRN: [id number]  CC:  Chief Complaint  Patient presents with   Hypertension    Follow up    Hypertension   Vanessa Vazquez presents for checkup she denies any chest pain or shortness of breath.  Patient is known to have reflux dyslipidemia and hypertensive cardiovascular disease.  Past Medical History:  Diagnosis Date   Asthma    Diabetes mellitus without complication ([place])    GERD (gastroesophageal reflux disease)    Hypercholesteremia    Hypertension     Past Surgical History:  Procedure Laterality Date   CESAREAN SECTION     COLONOSCOPY WITH PROPOFOL N/A 03/19/2016   Procedure: COLONOSCOPY WITH PROPOFOL;  Surgeon: Manya Silvas, MD;  Location: [facility];  Service: Endoscopy;  Laterality: N/A;    Family History  Problem Relation Age of Onset   Lung cancer Father    Leukemia Sister    Diabetes Sister    Asthma Child     Social History   Socioeconomic History   Marital status: Widowed    Spouse name: Not on file   Number of children: Not on file   Years of education: Not on file   Highest education level: Not on file  Occupational History   Not on file  Tobacco Use   Smoking status: Former    Pack years: 0.00   Smokeless tobacco: Never  Substance and Sexual Activity   Alcohol use: No    Alcohol/week: 0.0 standard drinks   Drug use: No   Sexual activity: Never  Other Topics Concern   Not on file  Social History Narrative   Not on file   Social Determinants of Health   Financial Resource Strain: Not on file  Food Insecurity: Not on file  Transportation Needs: Not on file  Physical Activity: Not on file  Stress: Not on file  Social Connections: Not on file  Intimate Partner Violence: Not on file     Current Outpatient Medications:    Respiratory Therapy Supplies (NEBULIZER) DEVI, 1 Device by Does not apply route 2 (two)  times daily as needed., Disp: 1 each, Rfl: 0   albuterol (VENTOLIN HFA) 108 (90 Base) MCG/ACT inhaler, Inhale 2 puffs into the lungs every 4 (four) hours as needed for wheezing or shortness of breath., Disp: 1 each, Rfl: 0   atenolol (TENORMIN) 25 MG tablet, Take 1 tablet (25 mg total) by mouth daily., Disp: 30 tablet, Rfl: 6   atorvastatin (LIPITOR) 40 MG tablet, Take 1 tablet (40 mg total) by mouth daily., Disp: 90 tablet, Rfl: 3   dapagliflozin propanediol (FARXIGA) 10 MG TABS tablet, Take 1 tablet (10 mg total) by mouth daily., Disp: 90 tablet, Rfl: 3   ferrous sulfate 300 (60 Fe) MG/5ML syrup, Take 5 mLs (300 mg total) by mouth daily., Disp: 150 mL, Rfl: 3   furosemide (LASIX) 20 MG tablet, Take 1 tablet (20 mg total) by mouth every other day., Disp: 30 tablet, Rfl: 3   glimepiride (AMARYL) 2 MG tablet, TAKE 1 TABLET BY MOUTH TWICE DAILY, Disp: 180 tablet, Rfl: 3   loratadine (CLARITIN) 10 MG tablet, Take 1 tablet (10 mg total) by mouth daily., Disp: 30 tablet, Rfl: 3   losartan-hydrochlorothiazide (HYZAAR) 100-25 MG tablet, Take 1 tablet by mouth daily., Disp: 90 tablet, Rfl: 3   metFORMIN (GLUCOPHAGE) 1000 MG tablet, Take 1 tablet (  1,000 mg total) by mouth 2 (two) times daily., Disp: 180 tablet, Rfl: 3   pregabalin (LYRICA) 50 MG capsule, Take 1 capsule (50 mg total) by mouth 2 (two) times daily., Disp: 60 capsule, Rfl: 1   sitaGLIPtin (JANUVIA) 100 MG tablet, Take 1 tablet (100 mg total) by mouth daily., Disp: 90 tablet, Rfl: 3   No Known Allergies  ROS Review of Systems  Constitutional: Negative.   HENT: Negative.    Eyes: Negative.   Respiratory: Negative.    Cardiovascular: Negative.   Gastrointestinal: Negative.   Endocrine: Negative.   Genitourinary: Negative.   Musculoskeletal: Negative.   Skin: Negative.   Allergic/Immunologic: Negative.   Neurological: Negative.   Hematological: Negative.   Psychiatric/Behavioral: Negative.    All other systems reviewed and are  negative.    Objective:    Physical Exam Vitals reviewed.  Constitutional:      Appearance: Normal appearance.  HENT:     Mouth/Throat:     Mouth: Mucous membranes are moist.  Eyes:     Pupils: Pupils are equal, round, and reactive to light.  Neck:     Vascular: No carotid bruit.  Cardiovascular:     Rate and Rhythm: Normal rate and regular rhythm.     Pulses: Normal pulses.     Heart sounds: Normal heart sounds.  Pulmonary:     Effort: Pulmonary effort is normal.     Breath sounds: Normal breath sounds.  Abdominal:     General: Bowel sounds are normal.     Palpations: Abdomen is soft. There is no hepatomegaly, splenomegaly or mass.     Tenderness: There is no abdominal tenderness.     Hernia: No hernia is present.  Musculoskeletal:        General: No tenderness.     Cervical back: Neck supple.     Right lower leg: No edema.     Left lower leg: No edema.  Skin:    Findings: No rash.  Neurological:     Mental Status: She is alert and oriented to person, place, and time.     Motor: No weakness.  Psychiatric:        Mood and Affect: Mood and affect normal.        Behavior: Behavior normal.    Ht 5\' 4"  (1.626 m)   Wt 223 lb 11.2 oz (101.5 kg)   BMI 38.40 kg/m  Wt Readings from Last 3 Encounters:  11/02/20 223 lb 11.2 oz (101.5 kg)  10/20/20 235 lb 14.3 oz (107 kg)  10/19/20 236 lb (107 kg)     Health Maintenance Due  Topic Date Due   COVID-19 Vaccine (1) Never done   FOOT EXAM  Never done   TETANUS/TDAP  Never done   Zoster Vaccines- Shingrix (1 of 2) Never done   PNA vac Low Risk Adult (2 of 2 - PCV13) 04/11/2004   HEMOGLOBIN A1C  05/24/2020    There are no preventive care reminders to display for this patient.  Lab Results  Component Value Date   TSH 0.56 11/23/2019   Lab Results  Component Value Date   WBC 5.0 10/20/2020   HGB 12.7 10/20/2020   HCT 38.3 10/20/2020   MCV 94.3 10/20/2020   PLT 221 10/20/2020   Lab Results  Component Value  Date   NA 137 10/20/2020   K 3.8 10/20/2020   CO2 23 10/20/2020   GLUCOSE 178 (H) 10/20/2020   BUN 12 10/20/2020   CREATININE 0.84  10/20/2020   BILITOT 0.8 11/23/2019   ALKPHOS 91 09/29/2019   AST 22 11/23/2019   ALT 17 11/23/2019   PROT 6.6 11/23/2019   ALBUMIN 3.8 09/29/2019   CALCIUM 9.6 10/20/2020   ANIONGAP 12 10/20/2020  Results for Dowson, Vanessa Vazquez (MRN [id number]) as of 11/04/2020 14:55  Ref. Range 10/20/2020 14:52  Glucose Latest Ref Range: 70 - 99 mg/dL 178 (H)  BUN Latest Ref Range: 8 - 23 mg/dL 12  Creatinine Latest Ref Range: 0.44 - 1.00 mg/dL 0.84  Calcium Latest Ref Range: 8.9 - 10.3 mg/dL 9.6  Anion gap Latest Ref Range: 5 - 15  12        No results found for: CHOL No results found for: HDL No results found for: LDLCALC No results found for: TRIG No results found for: CHOLHDL Lab Results  Component Value Date   HGBA1C 7.1 (H) 11/23/2019      Assessment & Plan:   Problem List Items Addressed This Visit       Cardiovascular and Mediastinum   Essential hypertension - Primary    Blood pressure stable at the present time         Endocrine   Type 2 diabetes mellitus without complication, without long-term current use of insulin ([place])    I told the patient that we will need to recheck her blood sugar on a regular basis and hemoglobin AIC at least every 3 months.  She was advised to lose weight and avoid soft drinks         Other   Class 3 severe obesity due to excess calories with serious comorbidity and body mass index (BMI) of 40.0 to 44.9 in adult [facility])    - I encouraged the patient to lose weight.  - I educated them on making healthy dietary choices including eating more fruits and vegetables and less fried foods. - I encouraged the patient to exercise more, and educated on the benefits of exercise including weight loss, diabetes prevention, and hypertension prevention.   Dietary counseling with a registered dietician  Referral to a weight management  support group (e.g. Weight Watchers, Overeaters Anonymous)  If your BMI is greater than 29 or you have gained more than 15 pounds you should work on weight loss.  Attend a healthy cooking class        Fatigue    Patient has generalized fatigue due to chronic disease diabetes hypertension and arthritis        Meds ordered this encounter  Medications   Respiratory Therapy Supplies (NEBULIZER) DEVI    Sig: 1 Device by Does not apply route 2 (two) times daily as needed.    Dispense:  1 each    Refill:  0   pregabalin (LYRICA) 50 MG capsule    Sig: Take 1 capsule (50 mg total) by mouth 2 (two) times daily.    Dispense:  60 capsule    Refill:  1    Follow-up: No follow-ups on file.    Cletis Athens, MD

## 2020-12-05 ENCOUNTER — Other Ambulatory Visit: Payer: Self-pay

## 2020-12-05 ENCOUNTER — Ambulatory Visit (INDEPENDENT_AMBULATORY_CARE_PROVIDER_SITE_OTHER): Payer: Medicare Other | Admitting: Internal Medicine

## 2020-12-05 VITALS — BP 132/66 | HR 58 | Ht 64.0 in | Wt 232.1 lb

## 2020-12-05 DIAGNOSIS — I10 Essential (primary) hypertension: Secondary | ICD-10-CM | POA: Diagnosis not present

## 2020-12-05 DIAGNOSIS — R5383 Other fatigue: Secondary | ICD-10-CM

## 2020-12-05 DIAGNOSIS — E119 Type 2 diabetes mellitus without complications: Secondary | ICD-10-CM

## 2020-12-05 DIAGNOSIS — E1169 Type 2 diabetes mellitus with other specified complication: Secondary | ICD-10-CM

## 2020-12-05 DIAGNOSIS — E7800 Pure hypercholesterolemia, unspecified: Secondary | ICD-10-CM | POA: Diagnosis not present

## 2020-12-05 DIAGNOSIS — Z6841 Body Mass Index (BMI) 40.0 and over, adult: Secondary | ICD-10-CM

## 2020-12-05 DIAGNOSIS — J4520 Mild intermittent asthma, uncomplicated: Secondary | ICD-10-CM

## 2020-12-05 LAB — GLUCOSE, POCT (MANUAL RESULT ENTRY): POC Glucose: 153 mg/dl — AB (ref 70–99)

## 2020-12-05 NOTE — Progress Notes (Signed)
Established Patient Office Visit  Subjective:  Patient ID: Vanessa Vazquez, female    DOB: 1935/02/21  Age: 86 y.o. MRN: [id number]  CC:  Chief Complaint  Patient presents with   Hypertension    Hypertension Pertinent negatives include no chest pain.   Vanessa Vazquez presents for sob and wheezing, obesity  Past Medical History:  Diagnosis Date   Asthma    Diabetes mellitus without complication ([place])    GERD (gastroesophageal reflux disease)    Hypercholesteremia    Hypertension     Past Surgical History:  Procedure Laterality Date   CESAREAN SECTION     COLONOSCOPY WITH PROPOFOL N/A 03/19/2016   Procedure: COLONOSCOPY WITH PROPOFOL;  Surgeon: Manya Silvas, MD;  Location: [facility];  Service: Endoscopy;  Laterality: N/A;    Family History  Problem Relation Age of Onset   Lung cancer Father    Leukemia Sister    Diabetes Sister    Asthma Child     Social History   Socioeconomic History   Marital status: Widowed    Spouse name: Not on file   Number of children: Not on file   Years of education: Not on file   Highest education level: Not on file  Occupational History   Not on file  Tobacco Use   Smoking status: Former    Pack years: 0.00   Smokeless tobacco: Never  Substance and Sexual Activity   Alcohol use: No    Alcohol/week: 0.0 standard drinks   Drug use: No   Sexual activity: Never  Other Topics Concern   Not on file  Social History Narrative   Not on file   Social Determinants of Health   Financial Resource Strain: Not on file  Food Insecurity: Not on file  Transportation Needs: Not on file  Physical Activity: Not on file  Stress: Not on file  Social Connections: Not on file  Intimate Partner Violence: Not on file     Current Outpatient Medications:    albuterol (VENTOLIN HFA) 108 (90 Base) MCG/ACT inhaler, Inhale 2 puffs into the lungs every 4 (four) hours as needed for wheezing or shortness of breath., Disp: 1 each, Rfl: 0    atenolol (TENORMIN) 25 MG tablet, Take 1 tablet (25 mg total) by mouth daily., Disp: 30 tablet, Rfl: 6   atorvastatin (LIPITOR) 40 MG tablet, Take 1 tablet (40 mg total) by mouth daily., Disp: 90 tablet, Rfl: 3   dapagliflozin propanediol (FARXIGA) 10 MG TABS tablet, Take 1 tablet (10 mg total) by mouth daily., Disp: 90 tablet, Rfl: 3   furosemide (LASIX) 20 MG tablet, Take 1 tablet (20 mg total) by mouth every other day., Disp: 30 tablet, Rfl: 3   glimepiride (AMARYL) 2 MG tablet, TAKE 1 TABLET BY MOUTH TWICE DAILY, Disp: 180 tablet, Rfl: 3   loratadine (CLARITIN) 10 MG tablet, Take 1 tablet (10 mg total) by mouth daily., Disp: 30 tablet, Rfl: 3   losartan-hydrochlorothiazide (HYZAAR) 100-25 MG tablet, Take 1 tablet by mouth daily., Disp: 90 tablet, Rfl: 3   metFORMIN (GLUCOPHAGE) 1000 MG tablet, Take 1 tablet (1,000 mg total) by mouth 2 (two) times daily., Disp: 180 tablet, Rfl: 3   pregabalin (LYRICA) 50 MG capsule, Take 1 capsule (50 mg total) by mouth 2 (two) times daily., Disp: 60 capsule, Rfl: 1   Respiratory Therapy Supplies (NEBULIZER) DEVI, 1 Device by Does not apply route 2 (two) times daily as needed., Disp: 1 each, Rfl: 0  sitaGLIPtin (JANUVIA) 100 MG tablet, Take 1 tablet (100 mg total) by mouth daily., Disp: 90 tablet, Rfl: 3   ferrous sulfate 300 (60 Fe) MG/5ML syrup, Take 5 mLs (300 mg total) by mouth daily., Disp: 150 mL, Rfl: 3   No Known Allergies  ROS Review of Systems  Constitutional: Negative.   HENT: Negative.    Eyes: Negative.   Respiratory: Negative.    Cardiovascular:  Positive for leg swelling. Negative for chest pain.  Gastrointestinal: Negative.  Negative for abdominal distention and blood in stool.  Endocrine: Negative.   Genitourinary: Negative.  Negative for hematuria.  Musculoskeletal:  Positive for back pain and joint swelling.  Skin: Negative.  Negative for color change.  Allergic/Immunologic: Negative.   Neurological:  Positive for weakness.   Hematological: Negative.   Psychiatric/Behavioral: Negative.    All other systems reviewed and are negative.    Objective:    Physical Exam Vitals reviewed.  Constitutional:      Appearance: Normal appearance.  HENT:     Mouth/Throat:     Mouth: Mucous membranes are moist.  Eyes:     Pupils: Pupils are equal, round, and reactive to light.  Neck:     Vascular: No carotid bruit.  Cardiovascular:     Rate and Rhythm: Normal rate and regular rhythm.     Pulses: Normal pulses.     Heart sounds: Normal heart sounds.  Pulmonary:     Effort: Pulmonary effort is normal.     Breath sounds: Normal breath sounds.  Abdominal:     General: Bowel sounds are normal.     Palpations: Abdomen is soft. There is no hepatomegaly, splenomegaly or mass.     Tenderness: There is no abdominal tenderness.     Hernia: No hernia is present.  Musculoskeletal:        General: No tenderness.     Cervical back: Neck supple.     Right lower leg: No edema.     Left lower leg: No edema.  Skin:    Coloration: Skin is not jaundiced.     Findings: No bruising, lesion or rash.  Neurological:     General: No focal deficit present.     Mental Status: She is alert and oriented to person, place, and time.     Motor: No weakness.  Psychiatric:        Mood and Affect: Mood and affect normal.        Behavior: Behavior normal.    BP 132/66   Pulse (!) 58   Ht 5\' 4"  (1.626 m)   Wt 232 lb 1.6 oz (105.3 kg)   BMI 39.84 kg/m  Wt Readings from Last 3 Encounters:  12/05/20 232 lb 1.6 oz (105.3 kg)  11/02/20 223 lb 11.2 oz (101.5 kg)  10/20/20 235 lb 14.3 oz (107 kg)     Health Maintenance Due  Topic Date Due   COVID-19 Vaccine (1) Never done   FOOT EXAM  Never done   TETANUS/TDAP  Never done   Zoster Vaccines- Shingrix (1 of 2) Never done   PNA vac Low Risk Adult (2 of 2 - PCV13) 04/11/2004   HEMOGLOBIN A1C  05/24/2020    There are no preventive care reminders to display for this patient.  Lab  Results  Component Value Date   TSH 0.56 11/23/2019   Lab Results  Component Value Date   WBC 5.0 10/20/2020   HGB 12.7 10/20/2020   HCT 38.3 10/20/2020   MCV  94.3 10/20/2020   PLT 221 10/20/2020   Lab Results  Component Value Date   NA 137 10/20/2020   K 3.8 10/20/2020   CO2 23 10/20/2020   GLUCOSE 178 (H) 10/20/2020   BUN 12 10/20/2020   CREATININE 0.84 10/20/2020   BILITOT 0.8 11/23/2019   ALKPHOS 91 09/29/2019   AST 22 11/23/2019   ALT 17 11/23/2019   PROT 6.6 11/23/2019   ALBUMIN 3.8 09/29/2019   CALCIUM 9.6 10/20/2020   ANIONGAP 12 10/20/2020   No results found for: CHOL No results found for: HDL No results found for: LDLCALC No results found for: TRIG No results found for: CHOLHDL Lab Results  Component Value Date   HGBA1C 7.1 (H) 11/23/2019      Assessment & Plan:      No orders of the defined types were placed in this encounter.   Follow-up: No follow-ups on file.    Cletis Athens, MD

## 2020-12-06 ENCOUNTER — Encounter: Payer: Self-pay | Admitting: Internal Medicine

## 2020-12-06 NOTE — Assessment & Plan Note (Signed)
Hypercholesterolemia  I advised the patient to follow Mediterranean diet This diet is rich in fruits vegetables and whole grain, and This diet is also rich in fish and lean meat Patient should also eat a handful of almonds or walnuts daily Recent heart study indicated that average follow-up on this kind of diet reduces the cardiovascular mortality by 50 to 70%== 

## 2020-12-06 NOTE — Assessment & Plan Note (Signed)
Stable at the present time. 

## 2020-12-06 NOTE — Assessment & Plan Note (Signed)

## 2020-12-06 NOTE — Assessment & Plan Note (Signed)

## 2020-12-06 NOTE — Assessment & Plan Note (Signed)

## 2020-12-16 ENCOUNTER — Other Ambulatory Visit: Payer: Self-pay | Admitting: Internal Medicine

## 2020-12-31 DIAGNOSIS — S199XXA Unspecified injury of neck, initial encounter: Secondary | ICD-10-CM | POA: Diagnosis not present

## 2020-12-31 DIAGNOSIS — Z79899 Other long term (current) drug therapy: Secondary | ICD-10-CM | POA: Diagnosis not present

## 2020-12-31 DIAGNOSIS — R519 Headache, unspecified: Secondary | ICD-10-CM | POA: Diagnosis not present

## 2020-12-31 DIAGNOSIS — Z7984 Long term (current) use of oral hypoglycemic drugs: Secondary | ICD-10-CM | POA: Diagnosis not present

## 2020-12-31 DIAGNOSIS — I10 Essential (primary) hypertension: Secondary | ICD-10-CM | POA: Diagnosis not present

## 2020-12-31 DIAGNOSIS — E041 Nontoxic single thyroid nodule: Secondary | ICD-10-CM | POA: Diagnosis not present

## 2020-12-31 DIAGNOSIS — Z7982 Long term (current) use of aspirin: Secondary | ICD-10-CM | POA: Diagnosis not present

## 2020-12-31 DIAGNOSIS — E119 Type 2 diabetes mellitus without complications: Secondary | ICD-10-CM | POA: Diagnosis not present

## 2020-12-31 DIAGNOSIS — M542 Cervicalgia: Secondary | ICD-10-CM | POA: Diagnosis not present

## 2021-01-16 ENCOUNTER — Ambulatory Visit (INDEPENDENT_AMBULATORY_CARE_PROVIDER_SITE_OTHER): Payer: Medicare Other | Admitting: Internal Medicine

## 2021-01-16 ENCOUNTER — Encounter: Payer: Self-pay | Admitting: Internal Medicine

## 2021-01-16 ENCOUNTER — Other Ambulatory Visit: Payer: Self-pay

## 2021-01-16 VITALS — BP 130/77 | HR 64 | Ht 64.0 in | Wt 229.7 lb

## 2021-01-16 DIAGNOSIS — Z0000 Encounter for general adult medical examination without abnormal findings: Secondary | ICD-10-CM | POA: Diagnosis not present

## 2021-01-16 DIAGNOSIS — E7800 Pure hypercholesterolemia, unspecified: Secondary | ICD-10-CM | POA: Diagnosis not present

## 2021-01-16 DIAGNOSIS — E66813 Obesity, class 3: Secondary | ICD-10-CM

## 2021-01-16 DIAGNOSIS — E119 Type 2 diabetes mellitus without complications: Secondary | ICD-10-CM

## 2021-01-16 DIAGNOSIS — Z6841 Body Mass Index (BMI) 40.0 and over, adult: Secondary | ICD-10-CM

## 2021-01-16 DIAGNOSIS — J4520 Mild intermittent asthma, uncomplicated: Secondary | ICD-10-CM

## 2021-01-16 DIAGNOSIS — I10 Essential (primary) hypertension: Secondary | ICD-10-CM | POA: Diagnosis not present

## 2021-01-16 LAB — GLUCOSE, POCT (MANUAL RESULT ENTRY): POC Glucose: 132 mg/dl — AB (ref 70–99)

## 2021-01-16 NOTE — Assessment & Plan Note (Signed)

## 2021-01-16 NOTE — Assessment & Plan Note (Signed)

## 2021-01-16 NOTE — Assessment & Plan Note (Signed)

## 2021-01-16 NOTE — Assessment & Plan Note (Signed)
Stable at the present time. 

## 2021-01-16 NOTE — Assessment & Plan Note (Signed)
Hypercholesterolemia  I advised the patient to follow Mediterranean diet This diet is rich in fruits vegetables and whole grain, and This diet is also rich in fish and lean meat Patient should also eat a handful of almonds or walnuts daily Recent heart study indicated that average follow-up on this kind of diet reduces the cardiovascular mortality by 50 to 70%== 

## 2021-01-16 NOTE — Progress Notes (Signed)
Established Patient Office Visit  Subjective:  Patient ID: Vanessa Vazquez, female    DOB: Aug 21, 1934  Age: 86 y.o. MRN: [id number]  CC:  Chief Complaint  Patient presents with   Annual Exam    HPI  Vanessa PEREZHERNANDEZ presents for physical  Past Medical History:  Diagnosis Date   Asthma    Diabetes mellitus without complication ([place])    GERD (gastroesophageal reflux disease)    Hypercholesteremia    Hypertension     Past Surgical History:  Procedure Laterality Date   CESAREAN SECTION     COLONOSCOPY WITH PROPOFOL N/A 03/19/2016   Procedure: COLONOSCOPY WITH PROPOFOL;  Surgeon: Manya Silvas, MD;  Location: [facility];  Service: Endoscopy;  Laterality: N/A;    Family History  Problem Relation Age of Onset   Lung cancer Father    Leukemia Sister    Diabetes Sister    Asthma Child     Social History   Socioeconomic History   Marital status: Widowed    Spouse name: Not on file   Number of children: Not on file   Years of education: Not on file   Highest education level: Not on file  Occupational History   Not on file  Tobacco Use   Smoking status: Former   Smokeless tobacco: Never  Substance and Sexual Activity   Alcohol use: No    Alcohol/week: 0.0 standard drinks   Drug use: No   Sexual activity: Never  Other Topics Concern   Not on file  Social History Narrative   Not on file   Social Determinants of Health   Financial Resource Strain: Not on file  Food Insecurity: Not on file  Transportation Needs: Not on file  Physical Activity: Not on file  Stress: Not on file  Social Connections: Not on file  Intimate Partner Violence: Not on file     Current Outpatient Medications:    albuterol (VENTOLIN HFA) 108 (90 Base) MCG/ACT inhaler, Inhale 2 puffs into the lungs every 4 (four) hours as needed for wheezing or shortness of breath., Disp: 1 each, Rfl: 0   atenolol (TENORMIN) 25 MG tablet, Take 1 tablet (25 mg total) by mouth daily., Disp: 30 tablet,  Rfl: 6   atorvastatin (LIPITOR) 40 MG tablet, Take 1 tablet (40 mg total) by mouth daily., Disp: 90 tablet, Rfl: 3   dapagliflozin propanediol (FARXIGA) 10 MG TABS tablet, Take 1 tablet (10 mg total) by mouth daily., Disp: 90 tablet, Rfl: 3   furosemide (LASIX) 20 MG tablet, Take 1 tablet (20 mg total) by mouth every other day., Disp: 30 tablet, Rfl: 3   glimepiride (AMARYL) 2 MG tablet, TAKE 1 TABLET BY MOUTH TWICE DAILY, Disp: 180 tablet, Rfl: 3   loratadine (CLARITIN) 10 MG tablet, Take 1 tablet (10 mg total) by mouth daily., Disp: 30 tablet, Rfl: 3   losartan-hydrochlorothiazide (HYZAAR) 100-25 MG tablet, Take 1 tablet by mouth daily., Disp: 90 tablet, Rfl: 3   metFORMIN (GLUCOPHAGE) 1000 MG tablet, Take 1 tablet (1,000 mg total) by mouth 2 (two) times daily., Disp: 180 tablet, Rfl: 3   pregabalin (LYRICA) 50 MG capsule, Take 1 capsule (50 mg total) by mouth 2 (two) times daily., Disp: 60 capsule, Rfl: 1   Respiratory Therapy Supplies (NEBULIZER) DEVI, 1 Device by Does not apply route 2 (two) times daily as needed., Disp: 1 each, Rfl: 0   sitaGLIPtin (JANUVIA) 100 MG tablet, Take 1 tablet (100 mg total) by mouth daily., Disp:  90 tablet, Rfl: 3   ferrous sulfate 300 (60 Fe) MG/5ML syrup, Take 5 mLs (300 mg total) by mouth daily., Disp: 150 mL, Rfl: 3   No Known Allergies  ROS Review of Systems  Constitutional: Negative.   HENT: Negative.    Eyes: Negative.   Respiratory: Negative.    Cardiovascular: Negative.   Gastrointestinal: Negative.   Endocrine: Negative.   Genitourinary: Negative.   Musculoskeletal: Negative.   Skin: Negative.   Allergic/Immunologic: Negative.   Neurological: Negative.   Hematological: Negative.   Psychiatric/Behavioral: Negative.    All other systems reviewed and are negative.    Objective:    Physical Exam Vitals reviewed.  Constitutional:      Appearance: Normal appearance.  HENT:     Mouth/Throat:     Mouth: Mucous membranes are moist.  Eyes:      Pupils: Pupils are equal, round, and reactive to light.  Neck:     Vascular: No carotid bruit.  Cardiovascular:     Rate and Rhythm: Normal rate and regular rhythm.     Pulses: Normal pulses.     Heart sounds: Normal heart sounds.  Pulmonary:     Effort: Pulmonary effort is normal.     Breath sounds: Normal breath sounds.  Abdominal:     General: Bowel sounds are normal.     Palpations: Abdomen is soft. There is no hepatomegaly, splenomegaly or mass.     Tenderness: There is no abdominal tenderness.     Hernia: No hernia is present.  Musculoskeletal:        General: No tenderness.     Cervical back: Neck supple.     Right lower leg: No edema.     Left lower leg: No edema.  Skin:    Findings: No rash.  Neurological:     Mental Status: She is alert and oriented to person, place, and time.     Motor: No weakness.  Psychiatric:        Mood and Affect: Mood and affect normal.        Behavior: Behavior normal.    BP 130/77   Pulse 64   Ht '5\' 4"'$  (1.626 m)   Wt 229 lb 11.2 oz (104.2 kg)   BMI 39.43 kg/m  Wt Readings from Last 3 Encounters:  01/16/21 229 lb 11.2 oz (104.2 kg)  12/05/20 232 lb 1.6 oz (105.3 kg)  11/02/20 223 lb 11.2 oz (101.5 kg)     Health Maintenance Due  Topic Date Due   COVID-19 Vaccine (1) Never done   FOOT EXAM  Never done   TETANUS/TDAP  Never done   Zoster Vaccines- Shingrix (1 of 2) Never done   PNA vac Low Risk Adult (2 of 2 - PCV13) 04/11/2004   HEMOGLOBIN A1C  05/24/2020   INFLUENZA VACCINE  12/26/2020    There are no preventive care reminders to display for this patient.  Lab Results  Component Value Date   TSH 0.56 11/23/2019   Lab Results  Component Value Date   WBC 5.0 10/20/2020   HGB 12.7 10/20/2020   HCT 38.3 10/20/2020   MCV 94.3 10/20/2020   PLT 221 10/20/2020   Lab Results  Component Value Date   NA 137 10/20/2020   K 3.8 10/20/2020   CO2 23 10/20/2020   GLUCOSE 178 (H) 10/20/2020   BUN 12 10/20/2020    CREATININE 0.84 10/20/2020   BILITOT 0.8 11/23/2019   ALKPHOS 91 09/29/2019   AST 22 11/23/2019  ALT 17 11/23/2019   PROT 6.6 11/23/2019   ALBUMIN 3.8 09/29/2019   CALCIUM 9.6 10/20/2020   ANIONGAP 12 10/20/2020   No results found for: CHOL No results found for: HDL No results found for: LDLCALC No results found for: TRIG No results found for: CHOLHDL Lab Results  Component Value Date   HGBA1C 7.1 (H) 11/23/2019      Assessment & Plan:   Problem List Items Addressed This Visit       Cardiovascular and Mediastinum   Essential hypertension     Patient denies any chest pain or shortness of breath there is no history of palpitation or paroxysmal nocturnal dyspnea   patient was advised to follow low-salt low-cholesterol diet    ideally I want to keep systolic blood pressure below 130 mmHg, patient was asked to check blood pressure one times a week and give me a report on that.  Patient will be follow-up in 3 months  or earlier as needed, patient will call me back for any change in the cardiovascular symptoms Patient was advised to buy a book from local bookstore concerning blood pressure and read several chapters  every day.  This will be supplemented by some of the material we will give him from the office.  Patient should also utilize other resources like YouTube and Internet to learn more about the blood pressure and the diet.        Respiratory   Asthma    Stable at the present time        Endocrine   Type 2 diabetes mellitus without complication, without long-term current use of insulin ([place])    - The patient's blood sugar is labile on med. - The patient will continue the current treatment regimen.  - I encouraged the patient to regularly check blood sugar.  - I encouraged the patient to monitor diet. I encouraged the patient to eat low-carb and low-sugar to help prevent blood sugar spikes.  - I encouraged the patient to continue following their prescribed treatment  plan for diabetes - I informed the patient to get help if blood sugar drops below '54mg'$ /dL, or if suddenly have trouble thinking clearly or breathing.  Patient was advised to buy a book on diabetes from a local bookstore or from [place]  Patient should read 2 chapters every day to keep the motivation going, this is in addition to some of the materials we provided them from the office.  There are other resources on the Internet like YouTube and wilkipedia to get an education on the diabetes        Other   Hypercholesteremia    Hypercholesterolemia  I advised the patient to follow Mediterranean diet This diet is rich in fruits vegetables and whole grain, and This diet is also rich in fish and lean meat Patient should also eat a handful of almonds or walnuts daily Recent heart study indicated that average follow-up on this kind of diet reduces the cardiovascular mortality by 50 to 70%==      Class 3 severe obesity due to excess calories with serious comorbidity and body mass index (BMI) of 40.0 to 44.9 in adult [facility])    - I encouraged the patient to lose weight.  - I educated them on making healthy dietary choices including eating more fruits and vegetables and less fried foods. - I encouraged the patient to exercise more, and educated on the benefits of exercise including weight loss, diabetes prevention, and hypertension prevention.  Dietary counseling with a registered dietician  Referral to a weight management support group (e.g. Weight Watchers, Overeaters Anonymous)  If your BMI is greater than 29 or you have gained more than 15 pounds you should work on weight loss.  Attend a healthy cooking class       Annual physical exam   Relevant Orders   Lipid panel   TSH   CBC with Differential/Platelet   COMPLETE METABOLIC PANEL WITH GFR   Other Visit Diagnoses     Type 2 diabetes mellitus without complication, unspecified whether long term insulin use ([place])    -  Primary   Relevant  Orders   POCT glucose (manual entry) (Completed)   Hemoglobin A1c   Microalbumin, urine       No orders of the defined types were placed in this encounter.   Follow-up: No follow-ups on file.    Cletis Athens, MD

## 2021-01-17 LAB — CBC WITH DIFFERENTIAL/PLATELET
Absolute Monocytes: 386 cells/uL (ref 200–950)
Basophils Absolute: 51 cells/uL (ref 0–200)
Basophils Relative: 1.1 %
Eosinophils Absolute: 147 cells/uL (ref 15–500)
Eosinophils Relative: 3.2 %
HCT: 35.4 % (ref 35.0–45.0)
Hemoglobin: 11.3 g/dL — ABNORMAL LOW (ref 11.7–15.5)
Lymphs Abs: 1371 cells/uL (ref 850–3900)
MCH: 30.6 pg (ref 27.0–33.0)
MCHC: 31.9 g/dL — ABNORMAL LOW (ref 32.0–36.0)
MCV: 95.9 fL (ref 80.0–100.0)
MPV: 12.3 fL (ref 7.5–12.5)
Monocytes Relative: 8.4 %
Neutro Abs: 2645 cells/uL (ref 1500–7800)
Neutrophils Relative %: 57.5 %
Platelets: 205 10*3/uL (ref 140–400)
RBC: 3.69 10*6/uL — ABNORMAL LOW (ref 3.80–5.10)
RDW: 11.7 % (ref 11.0–15.0)
Total Lymphocyte: 29.8 %
WBC: 4.6 10*3/uL (ref 3.8–10.8)

## 2021-01-17 LAB — COMPLETE METABOLIC PANEL WITH GFR
AG Ratio: 1.5 (calc) (ref 1.0–2.5)
ALT: 16 U/L (ref 6–29)
AST: 21 U/L (ref 10–35)
Albumin: 4.1 g/dL (ref 3.6–5.1)
Alkaline phosphatase (APISO): 68 U/L (ref 37–153)
BUN: 25 mg/dL (ref 7–25)
CO2: 24 mmol/L (ref 20–32)
Calcium: 10 mg/dL (ref 8.6–10.4)
Chloride: 108 mmol/L (ref 98–110)
Creat: 0.9 mg/dL (ref 0.60–0.95)
Globulin: 2.8 g/dL (calc) (ref 1.9–3.7)
Glucose, Bld: 125 mg/dL — ABNORMAL HIGH (ref 65–99)
Potassium: 3.8 mmol/L (ref 3.5–5.3)
Sodium: 140 mmol/L (ref 135–146)
Total Bilirubin: 0.7 mg/dL (ref 0.2–1.2)
Total Protein: 6.9 g/dL (ref 6.1–8.1)
eGFR: 63 mL/min/{1.73_m2} (ref >=60)

## 2021-01-17 LAB — HEMOGLOBIN A1C
Hgb A1c MFr Bld: 5.8 % of total Hgb — ABNORMAL HIGH (ref <5.7)
Mean Plasma Glucose: 120 mg/dL
eAG (mmol/L): 6.6 mmol/L

## 2021-01-17 LAB — LIPID PANEL
Cholesterol: 176 mg/dL (ref <200)
HDL: 50 mg/dL (ref >=50)
LDL Cholesterol (Calc): 103 mg/dL (calc) — ABNORMAL HIGH
Non-HDL Cholesterol (Calc): 126 mg/dL (calc) (ref <130)
Total CHOL/HDL Ratio: 3.5 (calc) (ref <5.0)
Triglycerides: 124 mg/dL (ref <150)

## 2021-01-17 LAB — TSH: TSH: 0.84 mIU/L (ref 0.40–4.50)

## 2021-02-15 ENCOUNTER — Encounter: Payer: Self-pay | Admitting: Internal Medicine

## 2021-02-15 ENCOUNTER — Other Ambulatory Visit: Payer: Self-pay

## 2021-02-15 ENCOUNTER — Ambulatory Visit (INDEPENDENT_AMBULATORY_CARE_PROVIDER_SITE_OTHER): Payer: Medicare Other | Admitting: Internal Medicine

## 2021-02-15 VITALS — BP 161/91 | HR 61 | Ht 64.0 in | Wt 230.1 lb

## 2021-02-15 DIAGNOSIS — I10 Essential (primary) hypertension: Secondary | ICD-10-CM | POA: Diagnosis not present

## 2021-02-15 DIAGNOSIS — D508 Other iron deficiency anemias: Secondary | ICD-10-CM | POA: Diagnosis not present

## 2021-02-15 DIAGNOSIS — Z6841 Body Mass Index (BMI) 40.0 and over, adult: Secondary | ICD-10-CM

## 2021-02-15 DIAGNOSIS — E119 Type 2 diabetes mellitus without complications: Secondary | ICD-10-CM

## 2021-02-15 LAB — GLUCOSE, POCT (MANUAL RESULT ENTRY): POC Glucose: 167 mg/dl — AB (ref 70–99)

## 2021-02-15 NOTE — Assessment & Plan Note (Signed)

## 2021-02-15 NOTE — Assessment & Plan Note (Signed)
Stable at the present time. 

## 2021-02-15 NOTE — Assessment & Plan Note (Signed)

## 2021-02-15 NOTE — Assessment & Plan Note (Signed)

## 2021-02-15 NOTE — Progress Notes (Signed)
Established Patient Office Visit  Subjective:  Patient ID: Vanessa Vazquez, female    DOB: Jun 26, 1934  Age: 86 y.o. MRN: [id number]  CC:  Chief Complaint  Patient presents with   Follow-up    HPI  Vanessa Vazquez presents for check up , bs is elevated, bp is high  Past Medical History:  Diagnosis Date   Asthma    Diabetes mellitus without complication ([place])    GERD (gastroesophageal reflux disease)    Hypercholesteremia    Hypertension     Past Surgical History:  Procedure Laterality Date   CESAREAN SECTION     COLONOSCOPY WITH PROPOFOL N/A 03/19/2016   Procedure: COLONOSCOPY WITH PROPOFOL;  Surgeon: Manya Silvas, MD;  Location: [facility];  Service: Endoscopy;  Laterality: N/A;    Family History  Problem Relation Age of Onset   Lung cancer Father    Leukemia Sister    Diabetes Sister    Asthma Child     Social History   Socioeconomic History   Marital status: Widowed    Spouse name: Not on file   Number of children: Not on file   Years of education: Not on file   Highest education level: Not on file  Occupational History   Not on file  Tobacco Use   Smoking status: Former   Smokeless tobacco: Never  Substance and Sexual Activity   Alcohol use: No    Alcohol/week: 0.0 standard drinks   Drug use: No   Sexual activity: Never  Other Topics Concern   Not on file  Social History Narrative   Not on file   Social Determinants of Health   Financial Resource Strain: Not on file  Food Insecurity: Not on file  Transportation Needs: Not on file  Physical Activity: Not on file  Stress: Not on file  Social Connections: Not on file  Intimate Partner Violence: Not on file     Current Outpatient Medications:    albuterol (VENTOLIN HFA) 108 (90 Base) MCG/ACT inhaler, Inhale 2 puffs into the lungs every 4 (four) hours as needed for wheezing or shortness of breath., Disp: 1 each, Rfl: 0   atenolol (TENORMIN) 25 MG tablet, Take 1 tablet (25 mg total) by  mouth daily., Disp: 30 tablet, Rfl: 6   atorvastatin (LIPITOR) 40 MG tablet, Take 1 tablet (40 mg total) by mouth daily., Disp: 90 tablet, Rfl: 3   dapagliflozin propanediol (FARXIGA) 10 MG TABS tablet, Take 1 tablet (10 mg total) by mouth daily., Disp: 90 tablet, Rfl: 3   furosemide (LASIX) 20 MG tablet, Take 1 tablet (20 mg total) by mouth every other day., Disp: 30 tablet, Rfl: 3   glimepiride (AMARYL) 2 MG tablet, TAKE 1 TABLET BY MOUTH TWICE DAILY, Disp: 180 tablet, Rfl: 3   loratadine (CLARITIN) 10 MG tablet, Take 1 tablet (10 mg total) by mouth daily., Disp: 30 tablet, Rfl: 3   losartan-hydrochlorothiazide (HYZAAR) 100-25 MG tablet, Take 1 tablet by mouth daily., Disp: 90 tablet, Rfl: 3   metFORMIN (GLUCOPHAGE) 1000 MG tablet, Take 1 tablet (1,000 mg total) by mouth 2 (two) times daily., Disp: 180 tablet, Rfl: 3   pregabalin (LYRICA) 50 MG capsule, Take 1 capsule (50 mg total) by mouth 2 (two) times daily., Disp: 60 capsule, Rfl: 1   Respiratory Therapy Supplies (NEBULIZER) DEVI, 1 Device by Does not apply route 2 (two) times daily as needed., Disp: 1 each, Rfl: 0   sitaGLIPtin (JANUVIA) 100 MG tablet, Take 1 tablet (  100 mg total) by mouth daily., Disp: 90 tablet, Rfl: 3   ferrous sulfate 300 (60 Fe) MG/5ML syrup, Take 5 mLs (300 mg total) by mouth daily., Disp: 150 mL, Rfl: 3   No Known Allergies  ROS Review of Systems  Constitutional: Negative.   HENT: Negative.    Eyes: Negative.   Respiratory: Negative.    Cardiovascular: Negative.   Gastrointestinal: Negative.   Endocrine: Negative.   Genitourinary: Negative.   Musculoskeletal: Negative.   Skin: Negative.   Allergic/Immunologic: Negative.   Neurological: Negative.   Hematological: Negative.   Psychiatric/Behavioral: Negative.    All other systems reviewed and are negative.    Objective:    Physical Exam Vitals reviewed.  Constitutional:      Appearance: Normal appearance.  HENT:     Mouth/Throat:     Mouth:  Mucous membranes are moist.  Eyes:     Pupils: Pupils are equal, round, and reactive to light.  Neck:     Vascular: No carotid bruit.  Cardiovascular:     Rate and Rhythm: Normal rate and regular rhythm.     Pulses: Normal pulses.     Heart sounds: Normal heart sounds.  Pulmonary:     Effort: Pulmonary effort is normal.     Breath sounds: Normal breath sounds.  Abdominal:     General: Bowel sounds are normal.     Palpations: Abdomen is soft. There is no hepatomegaly, splenomegaly or mass.     Tenderness: There is no abdominal tenderness.     Hernia: No hernia is present.  Musculoskeletal:        General: No tenderness.     Cervical back: Neck supple.     Right lower leg: No edema.     Left lower leg: No edema.  Skin:    Findings: No rash.  Neurological:     Mental Status: She is alert and oriented to person, place, and time.     Motor: No weakness.  Psychiatric:        Mood and Affect: Mood and affect normal.        Behavior: Behavior normal.    BP (!) 161/91   Pulse 61   Ht 5\' 4"  (1.626 m)   Wt 230 lb 1.6 oz (104.4 kg)   BMI 39.50 kg/m  Wt Readings from Last 3 Encounters:  02/15/21 230 lb 1.6 oz (104.4 kg)  01/16/21 229 lb 11.2 oz (104.2 kg)  12/05/20 232 lb 1.6 oz (105.3 kg)     Health Maintenance Due  Topic Date Due   COVID-19 Vaccine (1) Never done   FOOT EXAM  Never done   TETANUS/TDAP  Never done   Zoster Vaccines- Shingrix (1 of 2) Never done    There are no preventive care reminders to display for this patient.  Lab Results  Component Value Date   TSH 0.84 01/16/2021   Lab Results  Component Value Date   WBC 4.6 01/16/2021   HGB 11.3 (L) 01/16/2021   HCT 35.4 01/16/2021   MCV 95.9 01/16/2021   PLT 205 01/16/2021   Lab Results  Component Value Date   NA 140 01/16/2021   K 3.8 01/16/2021   CO2 24 01/16/2021   GLUCOSE 125 (H) 01/16/2021   BUN 25 01/16/2021   CREATININE 0.90 01/16/2021   BILITOT 0.7 01/16/2021   ALKPHOS 91 09/29/2019    AST 21 01/16/2021   ALT 16 01/16/2021   PROT 6.9 01/16/2021   ALBUMIN 3.8 09/29/2019  CALCIUM 10.0 01/16/2021   ANIONGAP 12 10/20/2020   EGFR 63 01/16/2021   Lab Results  Component Value Date   CHOL 176 01/16/2021   Lab Results  Component Value Date   HDL 50 01/16/2021   Lab Results  Component Value Date   LDLCALC 103 (H) 01/16/2021   Lab Results  Component Value Date   TRIG 124 01/16/2021   Lab Results  Component Value Date   CHOLHDL 3.5 01/16/2021   Lab Results  Component Value Date   HGBA1C 5.8 (H) 01/16/2021      Assessment & Plan:   Problem List Items Addressed This Visit       Endocrine   Type 2 diabetes mellitus without complication, without long-term current use of insulin ([place]) - Primary   Relevant Orders   POCT glucose (manual entry) (Completed)   Microalbumin, urine    No orders of the defined types were placed in this encounter.   Follow-up: No follow-ups on file.    Cletis Athens, MD

## 2021-02-16 LAB — MICROALBUMIN, URINE: Microalb, Ur: 0.9 mg/dL

## 2021-02-17 ENCOUNTER — Other Ambulatory Visit: Payer: Self-pay | Admitting: Internal Medicine

## 2021-02-17 DIAGNOSIS — E119 Type 2 diabetes mellitus without complications: Secondary | ICD-10-CM

## 2021-03-22 ENCOUNTER — Other Ambulatory Visit: Payer: Self-pay | Admitting: Internal Medicine

## 2021-03-22 ENCOUNTER — Other Ambulatory Visit: Payer: Self-pay

## 2021-03-22 ENCOUNTER — Ambulatory Visit (INDEPENDENT_AMBULATORY_CARE_PROVIDER_SITE_OTHER): Payer: Medicare Other | Admitting: Internal Medicine

## 2021-03-22 DIAGNOSIS — R5383 Other fatigue: Secondary | ICD-10-CM | POA: Diagnosis not present

## 2021-03-22 DIAGNOSIS — E7800 Pure hypercholesterolemia, unspecified: Secondary | ICD-10-CM

## 2021-03-22 DIAGNOSIS — M25552 Pain in left hip: Secondary | ICD-10-CM

## 2021-03-22 DIAGNOSIS — I10 Essential (primary) hypertension: Secondary | ICD-10-CM | POA: Diagnosis not present

## 2021-03-22 DIAGNOSIS — E119 Type 2 diabetes mellitus without complications: Secondary | ICD-10-CM

## 2021-03-22 DIAGNOSIS — J301 Allergic rhinitis due to pollen: Secondary | ICD-10-CM

## 2021-03-24 ENCOUNTER — Other Ambulatory Visit: Payer: Self-pay | Admitting: Internal Medicine

## 2021-03-24 ENCOUNTER — Encounter: Payer: Self-pay | Admitting: Internal Medicine

## 2021-03-24 ENCOUNTER — Ambulatory Visit
Admission: RE | Admit: 2021-03-24 | Discharge: 2021-03-24 | Disposition: A | Discharge status: Home / Self Care | Payer: Medicare Other | Source: Ambulatory Visit | Attending: Internal Medicine | Admitting: Internal Medicine

## 2021-03-24 DIAGNOSIS — M25552 Pain in left hip: Secondary | ICD-10-CM

## 2021-03-24 NOTE — Assessment & Plan Note (Signed)
Hypercholesterolemia  I advised the patient to follow Mediterranean diet This diet is rich in fruits vegetables and whole grain, and This diet is also rich in fish and lean meat Patient should also eat a handful of almonds or walnuts daily Recent heart study indicated that average follow-up on this kind of diet reduces the cardiovascular mortality by 50 to 70%== 

## 2021-03-24 NOTE — Assessment & Plan Note (Signed)

## 2021-03-24 NOTE — Assessment & Plan Note (Signed)
Patient was advised to walk daily and lose weight

## 2021-03-24 NOTE — Assessment & Plan Note (Signed)
Take Claritin 5 mg p.o. daily 

## 2021-03-24 NOTE — Progress Notes (Signed)
Established Patient Office Visit  Subjective:  Patient ID: Vanessa Vazquez, female    DOB: [birth year]  Age: 86 y.o. MRN: [id number]  CC:  Chief Complaint  Patient presents with   Hip Pain    Hip Pain    Vanessa Vazquez presents for her checkup.  Patient is known to have a reflux problem elevated cholesterol blood pressure and asthma.  She denies any chest pain  Past Medical History:  Diagnosis Date   Asthma    Diabetes mellitus without complication ([place])    GERD (gastroesophageal reflux disease)    Hypercholesteremia    Hypertension     Past Surgical History:  Procedure Laterality Date   CESAREAN SECTION     COLONOSCOPY WITH PROPOFOL N/A 03/19/2016   Procedure: COLONOSCOPY WITH PROPOFOL;  Surgeon: Manya Silvas, MD;  Location: [facility];  Service: Endoscopy;  Laterality: N/A;    Family History  Problem Relation Age of Onset   Lung cancer Father    Leukemia Sister    Diabetes Sister    Asthma Child     Social History   Socioeconomic History   Marital status: Widowed    Spouse name: Not on file   Number of children: Not on file   Years of education: Not on file   Highest education level: Not on file  Occupational History   Not on file  Tobacco Use   Smoking status: Former   Smokeless tobacco: Never  Substance and Sexual Activity   Alcohol use: No    Alcohol/week: 0.0 standard drinks   Drug use: No   Sexual activity: Never  Other Topics Concern   Not on file  Social History Narrative   Not on file   Social Determinants of Health   Financial Resource Strain: Not on file  Food Insecurity: Not on file  Transportation Needs: Not on file  Physical Activity: Not on file  Stress: Not on file  Social Connections: Not on file  Intimate Partner Violence: Not on file     Current Outpatient Medications:    albuterol (VENTOLIN HFA) 108 (90 Base) MCG/ACT inhaler, Inhale 2 puffs into the lungs every 4 (four) hours as needed for wheezing or shortness of  breath., Disp: 1 each, Rfl: 0   atenolol (TENORMIN) 25 MG tablet, TAKE 1 TABLET BY MOUTH ONCE DAILY, Disp: 30 tablet, Rfl: 6   atorvastatin (LIPITOR) 40 MG tablet, Take 1 tablet (40 mg total) by mouth daily., Disp: 90 tablet, Rfl: 3   furosemide (LASIX) 20 MG tablet, Take 1 tablet (20 mg total) by mouth every other day., Disp: 30 tablet, Rfl: 3   glimepiride (AMARYL) 2 MG tablet, TAKE 1 TABLET BY MOUTH TWICE DAILY, Disp: 180 tablet, Rfl: 3   loratadine (CLARITIN) 10 MG tablet, Take 1 tablet (10 mg total) by mouth daily., Disp: 30 tablet, Rfl: 3   losartan-hydrochlorothiazide (HYZAAR) 100-25 MG tablet, Take 1 tablet by mouth daily., Disp: 90 tablet, Rfl: 3   metFORMIN (GLUCOPHAGE) 1000 MG tablet, Take 1 tablet (1,000 mg total) by mouth 2 (two) times daily., Disp: 180 tablet, Rfl: 3   Respiratory Therapy Supplies (NEBULIZER) DEVI, 1 Device by Does not apply route 2 (two) times daily as needed., Disp: 1 each, Rfl: 0   sitaGLIPtin (JANUVIA) 100 MG tablet, Take 1 tablet (100 mg total) by mouth daily., Disp: 90 tablet, Rfl: 3   ferrous sulfate 300 (60 Fe) MG/5ML syrup, Take 5 mLs (300 mg total) by mouth daily., Disp: 150  mL, Rfl: 3   pregabalin (LYRICA) 50 MG capsule, TAKE 1 CAPSULE BY MOUTH TWICE DAILY, Disp: 60 capsule, Rfl: 6   No Known Allergies  ROS Review of Systems  Constitutional: Negative.   HENT: Negative.    Eyes: Negative.   Respiratory: Negative.    Cardiovascular: Negative.   Gastrointestinal: Negative.   Endocrine: Negative.   Genitourinary: Negative.   Musculoskeletal: Negative.   Skin: Negative.   Allergic/Immunologic: Negative.   Neurological: Negative.   Hematological: Negative.   Psychiatric/Behavioral: Negative.    All other systems reviewed and are negative.    Objective:    Physical Exam Vitals reviewed.  Constitutional:      Appearance: Normal appearance.  HENT:     Mouth/Throat:     Mouth: Mucous membranes are moist.  Eyes:     Pupils: Pupils are equal,  round, and reactive to light.  Neck:     Vascular: No carotid bruit.  Cardiovascular:     Rate and Rhythm: Normal rate and regular rhythm.     Pulses: Normal pulses.     Heart sounds: Normal heart sounds.  Pulmonary:     Effort: Pulmonary effort is normal.     Breath sounds: Normal breath sounds.  Abdominal:     General: Bowel sounds are normal.     Palpations: Abdomen is soft. There is no hepatomegaly, splenomegaly or mass.     Tenderness: There is no abdominal tenderness.     Hernia: No hernia is present.  Musculoskeletal:        General: No tenderness.     Cervical back: Neck supple.     Right lower leg: No edema.     Left lower leg: No edema.  Skin:    Findings: No rash.  Neurological:     Mental Status: She is alert and oriented to person, place, and time.     Motor: No weakness.  Psychiatric:        Mood and Affect: Mood and affect normal.        Behavior: Behavior normal.    There were no vitals taken for this visit. Wt Readings from Last 3 Encounters:  02/15/21 230 lb 1.6 oz (104.4 kg)  01/16/21 229 lb 11.2 oz (104.2 kg)  12/05/20 232 lb 1.6 oz (105.3 kg)     Health Maintenance Due  Topic Date Due   COVID-19 Vaccine (1) Never done   Pneumonia Vaccine 50+ Years old (1 - PCV) Never done   FOOT EXAM  Never done   TETANUS/TDAP  Never done   Zoster Vaccines- Shingrix (1 of 2) Never done    There are no preventive care reminders to display for this patient.  Lab Results  Component Value Date   TSH 0.84 01/16/2021   Lab Results  Component Value Date   WBC 4.6 01/16/2021   HGB 11.3 (L) 01/16/2021   HCT 35.4 01/16/2021   MCV 95.9 01/16/2021   PLT 205 01/16/2021   Lab Results  Component Value Date   NA 140 01/16/2021   K 3.8 01/16/2021   CO2 24 01/16/2021   GLUCOSE 125 (H) 01/16/2021   BUN 25 01/16/2021   CREATININE 0.90 01/16/2021   BILITOT 0.7 01/16/2021   ALKPHOS 91 09/29/2019   AST 21 01/16/2021   ALT 16 01/16/2021   PROT 6.9 01/16/2021    ALBUMIN 3.8 09/29/2019   CALCIUM 10.0 01/16/2021   ANIONGAP 12 10/20/2020   EGFR 63 01/16/2021   Lab Results  Component Value  Date   CHOL 176 01/16/2021   Lab Results  Component Value Date   HDL 50 01/16/2021   Lab Results  Component Value Date   LDLCALC 103 (H) 01/16/2021   Lab Results  Component Value Date   TRIG 124 01/16/2021   Lab Results  Component Value Date   CHOLHDL 3.5 01/16/2021   Lab Results  Component Value Date   HGBA1C 5.8 (H) 01/16/2021      Assessment & Plan:   Problem List Items Addressed This Visit       Cardiovascular and Mediastinum   Essential hypertension    The following hypertensive lifestyle modification were recommended and discussed:  1. Limiting alcohol intake to less than 1 oz/day of ethanol:(24 oz of beer or 8 oz of wine or 2 oz of 100-proof whiskey). 2. Take baby ASA 81 mg daily. 3. Importance of regular aerobic exercise and losing weight. 4. Reduce dietary saturated fat and cholesterol intake for overall cardiovascular health. 5. Maintaining adequate dietary potassium, calcium, and magnesium intake. 6. Regular monitoring of the blood pressure. 7. Reduce sodium intake to less than 100 mmol/day (less than 2.3 gm of sodium or less than 6 gm of sodium choride)         Respiratory   Seasonal allergic rhinitis due to pollen    Take Claritin 5 mg p.o. daily        Other   Hypercholesteremia    Hypercholesterolemia  I advised the patient to follow Mediterranean diet This diet is rich in fruits vegetables and whole grain, and This diet is also rich in fish and lean meat Patient should also eat a handful of almonds or walnuts daily Recent heart study indicated that average follow-up on this kind of diet reduces the cardiovascular mortality by 50 to 70%==      Fatigue    Patient was advised to walk daily and lose weight      Other Visit Diagnoses     Hip pain, left    -  Primary   Relevant Orders   DG Hip Unilat W OR W/O  Pelvis 2-3 Views Left       No orders of the defined types were placed in this encounter.   Follow-up: No follow-ups on file.    Cletis Athens, MD

## 2021-04-13 ENCOUNTER — Other Ambulatory Visit: Payer: Self-pay | Admitting: Internal Medicine

## 2021-04-18 ENCOUNTER — Other Ambulatory Visit: Payer: Self-pay

## 2021-04-18 ENCOUNTER — Encounter: Payer: Self-pay | Admitting: Internal Medicine

## 2021-04-18 ENCOUNTER — Ambulatory Visit (INDEPENDENT_AMBULATORY_CARE_PROVIDER_SITE_OTHER): Payer: Medicare Other | Admitting: Internal Medicine

## 2021-04-18 VITALS — BP 150/80 | HR 85 | Ht 64.0 in | Wt 230.0 lb

## 2021-04-18 DIAGNOSIS — Z23 Encounter for immunization: Secondary | ICD-10-CM | POA: Insufficient documentation

## 2021-04-18 DIAGNOSIS — T466X5A Adverse effect of antihyperlipidemic and antiarteriosclerotic drugs, initial encounter: Secondary | ICD-10-CM

## 2021-04-18 DIAGNOSIS — E119 Type 2 diabetes mellitus without complications: Secondary | ICD-10-CM

## 2021-04-18 DIAGNOSIS — J301 Allergic rhinitis due to pollen: Secondary | ICD-10-CM

## 2021-04-18 DIAGNOSIS — M791 Myalgia, unspecified site: Secondary | ICD-10-CM | POA: Diagnosis not present

## 2021-04-18 DIAGNOSIS — I10 Essential (primary) hypertension: Secondary | ICD-10-CM

## 2021-04-18 DIAGNOSIS — E7800 Pure hypercholesterolemia, unspecified: Secondary | ICD-10-CM

## 2021-04-18 DIAGNOSIS — E118 Type 2 diabetes mellitus with unspecified complications: Secondary | ICD-10-CM | POA: Diagnosis not present

## 2021-04-18 DIAGNOSIS — Z6841 Body Mass Index (BMI) 40.0 and over, adult: Secondary | ICD-10-CM

## 2021-04-18 MED ORDER — METHYLPREDNISOLONE 4 MG PO TBPK: ORAL_TABLET | ORAL | 0 refills | Status: DC

## 2021-04-18 NOTE — Assessment & Plan Note (Signed)

## 2021-04-18 NOTE — Assessment & Plan Note (Signed)
Take Claritin 5 mg p.o. daily 

## 2021-04-18 NOTE — Assessment & Plan Note (Signed)
Hold statin follow-up 3 weeks I will

## 2021-04-18 NOTE — Progress Notes (Signed)
Established Patient Office Visit  Subjective:  Patient ID: Vanessa Vazquez, female    DOB: November 29, 1934  Age: 86 y.o. MRN: [id number]  CC:  Chief Complaint  Patient presents with   Diabetes    Diabetes   Vanessa Vazquez presents for pain rt shoulder  Past Medical History:  Diagnosis Date   Asthma    Diabetes mellitus without complication ([place])    GERD (gastroesophageal reflux disease)    Hypercholesteremia    Hypertension     Past Surgical History:  Procedure Laterality Date   CESAREAN SECTION     COLONOSCOPY WITH PROPOFOL N/A 03/19/2016   Procedure: COLONOSCOPY WITH PROPOFOL;  Surgeon: Manya Silvas, MD;  Location: [facility];  Service: Endoscopy;  Laterality: N/A;    Family History  Problem Relation Age of Onset   Lung cancer Father    Leukemia Sister    Diabetes Sister    Asthma Child     Social History   Socioeconomic History   Marital status: Widowed    Spouse name: Not on file   Number of children: Not on file   Years of education: Not on file   Highest education level: Not on file  Occupational History   Not on file  Tobacco Use   Smoking status: Former   Smokeless tobacco: Never  Substance and Sexual Activity   Alcohol use: No    Alcohol/week: 0.0 standard drinks   Drug use: No   Sexual activity: Never  Other Topics Concern   Not on file  Social History Narrative   Not on file   Social Determinants of Health   Financial Resource Strain: Not on file  Food Insecurity: Not on file  Transportation Needs: Not on file  Physical Activity: Not on file  Stress: Not on file  Social Connections: Not on file  Intimate Partner Violence: Not on file     Current Outpatient Medications:    albuterol (VENTOLIN HFA) 108 (90 Base) MCG/ACT inhaler, Inhale 2 puffs into the lungs every 4 (four) hours as needed for wheezing or shortness of breath., Disp: 1 each, Rfl: 0   atenolol (TENORMIN) 25 MG tablet, TAKE 1 TABLET BY MOUTH ONCE DAILY, Disp: 30  tablet, Rfl: 6   atorvastatin (LIPITOR) 40 MG tablet, Take 1 tablet (40 mg total) by mouth daily., Disp: 90 tablet, Rfl: 3   furosemide (LASIX) 20 MG tablet, Take 1 tablet (20 mg total) by mouth every other day., Disp: 30 tablet, Rfl: 3   glimepiride (AMARYL) 2 MG tablet, TAKE 1 TABLET BY MOUTH TWICE DAILY, Disp: 180 tablet, Rfl: 3   JANUVIA 100 MG tablet, TAKE 1 TABLET BY MOUTH ONCE DAILY, Disp: 90 tablet, Rfl: 3   loratadine (CLARITIN) 10 MG tablet, Take 1 tablet (10 mg total) by mouth daily., Disp: 30 tablet, Rfl: 3   losartan-hydrochlorothiazide (HYZAAR) 100-25 MG tablet, Take 1 tablet by mouth daily., Disp: 90 tablet, Rfl: 3   metFORMIN (GLUCOPHAGE) 1000 MG tablet, Take 1 tablet (1,000 mg total) by mouth 2 (two) times daily., Disp: 180 tablet, Rfl: 3   pregabalin (LYRICA) 50 MG capsule, TAKE 1 CAPSULE BY MOUTH TWICE DAILY, Disp: 60 capsule, Rfl: 6   Respiratory Therapy Supplies (NEBULIZER) DEVI, 1 Device by Does not apply route 2 (two) times daily as needed., Disp: 1 each, Rfl: 0   ferrous sulfate 300 (60 Fe) MG/5ML syrup, Take 5 mLs (300 mg total) by mouth daily., Disp: 150 mL, Rfl: 3   No Known  Allergies  ROS Review of Systems  Constitutional: Negative.   HENT: Negative.    Eyes: Negative.   Respiratory: Negative.    Cardiovascular: Negative.   Gastrointestinal: Negative.   Endocrine: Negative.   Genitourinary: Negative.   Musculoskeletal: Negative.   Skin: Negative.   Allergic/Immunologic: Negative.   Neurological: Negative.   Hematological: Negative.   Psychiatric/Behavioral: Negative.    All other systems reviewed and are negative.    Objective:    Physical Exam Vitals reviewed.  Constitutional:      Appearance: Normal appearance.  HENT:     Mouth/Throat:     Mouth: Mucous membranes are moist.  Eyes:     Pupils: Pupils are equal, round, and reactive to light.  Neck:     Vascular: No carotid bruit.  Cardiovascular:     Rate and Rhythm: Normal rate and regular  rhythm.     Pulses: Normal pulses.     Heart sounds: Normal heart sounds.  Pulmonary:     Effort: Pulmonary effort is normal.     Breath sounds: Normal breath sounds.  Abdominal:     General: Bowel sounds are normal.     Palpations: Abdomen is soft. There is no hepatomegaly, splenomegaly or mass.     Tenderness: There is no abdominal tenderness.     Hernia: No hernia is present.  Musculoskeletal:        General: No tenderness.     Cervical back: Neck supple.     Right lower leg: No edema.     Left lower leg: No edema.  Skin:    Findings: No rash.  Neurological:     Mental Status: She is alert and oriented to person, place, and time.     Motor: No weakness.  Psychiatric:        Mood and Affect: Mood and affect normal.        Behavior: Behavior normal.    BP (!) 150/80   Pulse 85   Ht _0  (1.626 m)   Wt 230 lb (104.3 kg)   BMI 39.48 kg/m  Wt Readings from Last 3 Encounters:  04/18/21 230 lb (104.3 kg)  02/15/21 230 lb 1.6 oz (104.4 kg)  01/16/21 229 lb 11.2 oz (104.2 kg)     Health Maintenance Due  Topic Date Due   FOOT EXAM  Never done   TETANUS/TDAP  Never done    There are no preventive care reminders to display for this patient.  Lab Results  Component Value Date   TSH 0.84 01/16/2021   Lab Results  Component Value Date   WBC 4.6 01/16/2021   HGB 11.3 (L) 01/16/2021   HCT 35.4 01/16/2021   MCV 95.9 01/16/2021   PLT 205 01/16/2021   Lab Results  Component Value Date   NA 140 01/16/2021   K 3.8 01/16/2021   CO2 24 01/16/2021   GLUCOSE 125 (H) 01/16/2021   BUN 25 01/16/2021   CREATININE 0.90 01/16/2021   BILITOT 0.7 01/16/2021   ALKPHOS 91 09/29/2019   AST 21 01/16/2021   ALT 16 01/16/2021   PROT 6.9 01/16/2021   ALBUMIN 3.8 09/29/2019   CALCIUM 10.0 01/16/2021   ANIONGAP 12 10/20/2020   EGFR 63 01/16/2021   Lab Results  Component Value Date   CHOL 176 01/16/2021   Lab Results  Component Value Date   HDL 50 01/16/2021   Lab Results   Component Value Date   LDLCALC 103 (H) 01/16/2021   Lab Results  Component Value Date   TRIG 124 01/16/2021   Lab Results  Component Value Date   CHOLHDL 3.5 01/16/2021   Lab Results  Component Value Date   HGBA1C 5.8 (H) 01/16/2021      Assessment & Plan:   Problem List Items Addressed This Visit       Cardiovascular and Mediastinum   Essential hypertension     Patient denies any chest pain or shortness of breath there is no history of palpitation or paroxysmal nocturnal dyspnea   patient was advised to follow low-salt low-cholesterol diet    ideally I want to keep systolic blood pressure below 130 mmHg, patient was asked to check blood pressure one times a week and give me a report on that.  Patient will be follow-up in 3 months  or earlier as needed, patient will call me back for any change in the cardiovascular symptoms Patient was advised to buy a book from local bookstore concerning blood pressure and read several chapters  every day.  This will be supplemented by some of the material we will give him from the office.  Patient should also utilize other resources like YouTube and Internet to learn more about the blood pressure and the diet.        Respiratory   Seasonal allergic rhinitis due to pollen    Take Claritin 5 mg p.o. daily        Endocrine   Type 2 diabetes mellitus without complication, without long-term current use of insulin ([place])    - The patient's blood sugar is labile on med. - The patient will continue the current treatment regimen.  - I encouraged the patient to regularly check blood sugar.  - I encouraged the patient to monitor diet. I encouraged the patient to eat low-carb and low-sugar to help prevent blood sugar spikes.  - I encouraged the patient to continue following their prescribed treatment plan for diabetes - I informed the patient to get help if blood sugar drops below 22m/dL, or if suddenly have trouble thinking clearly or breathing.   Patient was advised to buy a book on diabetes from a local bookstore or from [place]  Patient should read 2 chapters every day to keep the motivation going, this is in addition to some of the materials we provided them from the office.  There are other resources on the Internet like YouTube and wilkipedia to get an education on the diabetes      [place]   Relevant Orders   Ambulatory referral to Podiatry   POCT glucose (manual entry)     Other   Hypercholesteremia    Hypercholesterolemia  I advised the patient to follow Mediterranean diet This diet is rich in fruits vegetables and whole grain, and This diet is also rich in fish and lean meat Patient should also eat a handful of almonds or walnuts daily Recent heart study indicated that average follow-up on this kind of diet reduces the cardiovascular mortality by 50 to 70%==      Class 3 severe obesity due to excess calories with serious comorbidity and body mass index (BMI) of 40.0 to 44.9 in adult ([facility]    Behavioral modification strategies: increasing lean protein intake, decreasing simple carbohydrates, increasing vegetables, increasing water intake, decreasing eating out, no skipping meals, meal planning and cooking strategies, keeping healthy foods in the home and planning for success.      Myalgia due to statin    Hold statin follow-up 3 weeks  I will      Need for Tdap vaccination - Primary    No orders of the defined types were placed in this encounter.   Follow-up: No follow-ups on file.    Cletis Athens, MD

## 2021-04-18 NOTE — Assessment & Plan Note (Signed)
Behavioral modification strategies: increasing lean protein intake, decreasing simple carbohydrates, increasing vegetables, increasing water intake, decreasing eating out, no skipping meals, meal planning and cooking strategies, keeping healthy foods in the home and planning for success. 

## 2021-04-18 NOTE — Assessment & Plan Note (Signed)
Hypercholesterolemia  I advised the patient to follow Mediterranean diet This diet is rich in fruits vegetables and whole grain, and This diet is also rich in fish and lean meat Patient should also eat a handful of almonds or walnuts daily Recent heart study indicated that average follow-up on this kind of diet reduces the cardiovascular mortality by 50 to 70%== 

## 2021-04-18 NOTE — Assessment & Plan Note (Signed)

## 2021-05-02 ENCOUNTER — Other Ambulatory Visit: Payer: Self-pay

## 2021-05-02 ENCOUNTER — Ambulatory Visit (INDEPENDENT_AMBULATORY_CARE_PROVIDER_SITE_OTHER): Payer: Medicare Other | Admitting: Podiatry

## 2021-05-02 DIAGNOSIS — E0843 Diabetes mellitus due to underlying condition with diabetic autonomic (poly)neuropathy: Secondary | ICD-10-CM | POA: Diagnosis not present

## 2021-05-02 DIAGNOSIS — M79674 Pain in right toe(s): Secondary | ICD-10-CM

## 2021-05-02 DIAGNOSIS — B351 Tinea unguium: Secondary | ICD-10-CM

## 2021-05-02 DIAGNOSIS — I70229 Atherosclerosis of native arteries of extremities with rest pain, unspecified extremity: Secondary | ICD-10-CM

## 2021-05-02 DIAGNOSIS — M79675 Pain in left toe(s): Secondary | ICD-10-CM

## 2021-05-02 DIAGNOSIS — E119 Type 2 diabetes mellitus without complications: Secondary | ICD-10-CM

## 2021-05-02 NOTE — Progress Notes (Signed)
   SUBJECTIVE Patient with a history of diabetes mellitus presents to office today complaining of elongated, thickened nails that cause pain while ambulating in shoes.  Patient is unable to trim their own nails.  Patient was last seen in the office 2018.  Patient is here for further evaluation and treatment.   Past Medical History:  Diagnosis Date   Asthma    Diabetes mellitus without complication ([place])    GERD (gastroesophageal reflux disease)    Hypercholesteremia    Hypertension     OBJECTIVE General Patient is awake, alert, and oriented x 3 and in no acute distress. Derm Skin is dry and supple bilateral. Negative open lesions or macerations. Remaining integument unremarkable. Nails are tender, long, thickened and dystrophic with subungual debris, consistent with onychomycosis, 1-5 bilateral. No signs of infection noted. Vasc edema noted bilateral ankles.  Unable to adequately palpate pulses bilateral.  Skin is cool to touch.  It appears that the right lower extremity has a delayed capillary refill. Neuro Epicritic and protective threshold sensation diminished bilaterally.  Musculoskeletal Exam No symptomatic pedal deformities noted bilateral. Muscular strength within normal limits.  ASSESSMENT 1. Diabetes Mellitus w/ peripheral neuropathy 2.  Pain due to onychomycosis of toenails bilateral 3.  Nocturnal rest pain right lower extremity  PLAN OF CARE 1. Patient evaluated today. 2. Instructed to maintain good pedal hygiene and foot care. Stressed importance of controlling blood sugar.  3. Mechanical debridement of nails 1-5 bilaterally performed using a nail nipper. Filed with dremel without incident.  4.  Because the patient's nocturnal leg pain to the right lower extremity and inability to adequately palpate the pulses of the right lower extremities were going to order ABIs bilateral lower extremities.  Recommend follow-up with vascular if abnormal 5.  Return to clinic  annually    Edrick Kins, DPM [facility]  Dr. Edrick Kins, DPM    [address], [place] [address]                Office [phone]  Fax [phone]

## 2021-05-11 ENCOUNTER — Ambulatory Visit (INDEPENDENT_AMBULATORY_CARE_PROVIDER_SITE_OTHER): Payer: Medicare Other | Admitting: *Deleted

## 2021-05-11 DIAGNOSIS — Q782 Osteopetrosis: Secondary | ICD-10-CM

## 2021-05-11 DIAGNOSIS — Z0000 Encounter for general adult medical examination without abnormal findings: Secondary | ICD-10-CM

## 2021-05-11 NOTE — Progress Notes (Signed)
Subjective:   Vanessa Vazquez is a 86 y.o. female who presents for Medicare Annual (Subsequent) preventive examination. I discussed the limitations of evaluation and management by telemedicine and the availability of in person appointments. Patient expressed understanding and agreed to proceed.   Visit performed using audio  Patient:home Provider:home    Review of Systems    Defer to provider  Cardiac Risk Factors include: advanced age (>61men, >34 women);diabetes mellitus;hypertension     Objective:    Today's Vitals   05/11/21 1004  PainSc: 5    There is no height or weight on file to calculate BMI.  Advanced Directives 05/11/2021 10/19/2020 10/02/2020 09/29/2019 09/17/2018 09/06/2018 07/26/2018  Does Patient Have a Medical Advance Directive? No No No No No No No  Would patient like information on creating a medical advance directive? No - Patient declined - No - Patient declined No - Patient declined - No - Patient declined No - Patient declined    Current Medications (verified) Outpatient Encounter Medications as of 05/11/2021  Medication Sig   albuterol (VENTOLIN HFA) 108 (90 Base) MCG/ACT inhaler Inhale 2 puffs into the lungs every 4 (four) hours as needed for wheezing or shortness of breath.   atenolol (TENORMIN) 25 MG tablet TAKE 1 TABLET BY MOUTH ONCE DAILY   atorvastatin (LIPITOR) 40 MG tablet Take 1 tablet (40 mg total) by mouth daily.   furosemide (LASIX) 20 MG tablet Take 1 tablet (20 mg total) by mouth every other day.   glimepiride (AMARYL) 2 MG tablet TAKE 1 TABLET BY MOUTH TWICE DAILY   JANUVIA 100 MG tablet TAKE 1 TABLET BY MOUTH ONCE DAILY   loratadine (CLARITIN) 10 MG tablet Take 1 tablet (10 mg total) by mouth daily.   losartan-hydrochlorothiazide (HYZAAR) 100-25 MG tablet Take 1 tablet by mouth daily.   metFORMIN (GLUCOPHAGE) 1000 MG tablet Take 1 tablet (1,000 mg total) by mouth 2 (two) times daily.   methylPREDNISolone (MEDROL DOSEPAK) 4 MG TBPK tablet Medrol  dose pack  as directed 4 mg   pregabalin (LYRICA) 50 MG capsule TAKE 1 CAPSULE BY MOUTH TWICE DAILY   Respiratory Therapy Supplies (NEBULIZER) DEVI 1 Device by Does not apply route 2 (two) times daily as needed.   ferrous sulfate 300 (60 Fe) MG/5ML syrup Take 5 mLs (300 mg total) by mouth daily.   No facility-administered encounter medications on file as of 05/11/2021.    Allergies (verified) Patient has no known allergies.   History: Past Medical History:  Diagnosis Date   Asthma    Diabetes mellitus without complication ([place])    GERD (gastroesophageal reflux disease)    Hypercholesteremia    Hypertension    Past Surgical History:  Procedure Laterality Date   CESAREAN SECTION     COLONOSCOPY WITH PROPOFOL N/A 03/19/2016   Procedure: COLONOSCOPY WITH PROPOFOL;  Surgeon: Manya Silvas, MD;  Location: [facility];  Service: Endoscopy;  Laterality: N/A;   Family History  Problem Relation Age of Onset   Lung cancer Father    Leukemia Sister    Diabetes Sister    Asthma Child    Social History   Socioeconomic History   Marital status: Widowed    Spouse name: Not on file   Number of children: Not on file   Years of education: Not on file   Highest education level: GED or equivalent  Occupational History   Not on file  Tobacco Use   Smoking status: Former   Smokeless tobacco: Never  Substance and Sexual Activity   Alcohol use: No    Alcohol/week: 0.0 standard drinks   Drug use: No   Sexual activity: Never  Other Topics Concern   Not on file  Social History Narrative   Not on file   Social Determinants of Health   Financial Resource Strain: Low Risk    Difficulty of Paying Living Expenses: Not hard at all  Food Insecurity: No Food Insecurity   Worried About Charity fundraiser in the Last Year: Never true   [employer] in the Last Year: Never true  Transportation Needs: No Transportation Needs   Lack of Transportation (Medical): No   Lack of  Transportation (Non-Medical): No  Physical Activity: Insufficiently Active   Days of Exercise per Week: 5 days   Minutes of Exercise per Session: 10 min  Stress: No Stress Concern Present   Feeling of Stress : Not at all  Social Connections: Moderately Isolated   Frequency of Communication with Friends and Family: More than three times a week   Frequency of Social Gatherings with Friends and Family: More than three times a week   Attends Religious Services: More than 4 times per year   Active Member of [employer] or Organizations: No   Attends Archivist Meetings: Never   Marital Status: Widowed    Tobacco Counseling Counseling given: Not Answered   Clinical Intake:  Pre-visit preparation completed: Yes  Pain : 0-10 Pain Score: 5  Pain Location: Knee Pain Orientation: Right, Left Pain Descriptors / Indicators: Constant Pain Onset: More than a month ago Pain Frequency: Constant Pain Relieving Factors: none  Pain Relieving Factors: none  Nutritional Risks: None Diabetes: Yes CBG done?: No Did pt. bring in CBG monitor from home?: No  How often do you need to have someone help you when you read instructions, pamphlets, or other written materials from your doctor or pharmacy?: 1 - Never What is the last grade level you completed in school?: GED  Diabetic?yes  Interpreter Needed?: No  Information entered by :: Lacretia Nicks, [place] of Daily Living In your present state of health, do you have any difficulty performing the following activities: 05/11/2021 04/18/2021  Hearing? N N  Vision? N N  Difficulty concentrating or making decisions? N N  Walking or climbing stairs? Y Y  Dressing or bathing? N N  Doing errands, shopping? N N  Preparing Food and eating ? N -  Using the Toilet? N -  In the past six months, have you accidently leaked urine? N -  Do you have problems with loss of bowel control? N -  Managing your Medications? N -  Managing your  Finances? N -  Housekeeping or managing your Housekeeping? N -  Some recent data might be hidden    Patient Care Team: Cletis Athens, MD as PCP - General (Internal Medicine)  Indicate any recent Medical Services you may have received from other than [facility] providers in the past year (date may be approximate).     Assessment:   This is a routine wellness examination for [place].  Hearing/Vision screen No results found.  Dietary issues and exercise activities discussed: Current Exercise Habits: The patient does not participate in regular exercise at present (patient having lots of knee pan), Exercise limited by: orthopedic condition(s)   Goals Addressed   None    Depression Screen PHQ 2/9 Scores 05/11/2021 04/18/2021 01/16/2021 01/06/2020  PHQ - 2 Score 0 0 0 0  Fall Risk Fall Risk  04/18/2021 01/16/2021 01/06/2020  Falls in the past year? 0 1 0  Number falls in past yr: 0 0 0  Injury with Fall? 0 0 0  Risk for fall due to : No Fall Risks History of fall(s) -  Follow up Falls evaluation completed Falls evaluation completed -    FALL RISK PREVENTION PERTAINING TO THE HOME:  Any stairs in or around the home? No  If so, are there any without handrails? No  Home free of loose throw rugs in walkways, pet beds, electrical cords, etc? Yes  Adequate lighting in your home to reduce risk of falls? Yes   ASSISTIVE DEVICES UTILIZED TO PREVENT FALLS:  Life alert? No  Use of a cane, walker or w/c? Yes  Grab bars in the bathroom? No  Shower chair or bench in shower? No  Elevated toilet seat or a handicapped toilet? No   TIMED UP AND GO:  Was the test performed? No .  Length of time to ambulate-na  Gait slow and steady with assistive device  Cognitive Function: MMSE - Mini Mental State Exam 05/11/2021  Not completed: Unable to complete     6CIT Screen 05/11/2021  What Year? 0 points  What month? 0 points  What time? 0 points  Count back from 20 0 points  Months in reverse  0 points  Repeat phrase 0 points  Total Score 0    Immunizations Immunization History  Administered Date(s) Administered   Influenza, Seasonal, Injecte, Preservative Fre 04/23/2006, 03/25/2007, 03/10/2009, 03/07/2010   Pneumococcal Polysaccharide-23 04/12/2003    TDAP status: Due, Education has been provided regarding the importance of this vaccine. Advised may receive this vaccine at local pharmacy or Health Dept. Aware to provide a copy of the vaccination record if obtained from local pharmacy or Health Dept. Verbalized acceptance and understanding.  Flu Vaccine status: Due, Education has been provided regarding the importance of this vaccine. Advised may receive this vaccine at local pharmacy or Health Dept. Aware to provide a copy of the vaccination record if obtained from local pharmacy or Health Dept. Verbalized acceptance and understanding.  Pneumococcal vaccine status: Up to date  Covid-19 vaccine status: Declined, Education has been provided regarding the importance of this vaccine but patient still declined. Advised may receive this vaccine at local pharmacy or Health Dept.or vaccine clinic. Aware to provide a copy of the vaccination record if obtained from local pharmacy or Health Dept. Verbalized acceptance and understanding.  Qualifies for Shingles Vaccine? Yes   Zostavax completed No   Shingrix Completed?: No.    Education has been provided regarding the importance of this vaccine. Patient has been advised to call insurance company to determine out of pocket expense if they have not yet received this vaccine. Advised may also receive vaccine at local pharmacy or Health Dept. Verbalized acceptance and understanding.  Screening Tests Health Maintenance  Topic Date Due   FOOT EXAM  Never done   TETANUS/TDAP  Never done   COVID-19 Vaccine (1) 05/27/2021 (Originally 09/10/1935)   Zoster Vaccines- Shingrix (1 of 2) 07/19/2021 (Originally 03/12/1954)   INFLUENZA VACCINE   08/25/2021 (Originally 12/26/2020)   Pneumonia Vaccine 80+ Years old (2 - PCV) 04/18/2022 (Originally 04/11/2004)   OPHTHALMOLOGY EXAM  06/23/2021   HEMOGLOBIN A1C  07/19/2021   DEXA SCAN  Completed   HPV VACCINES  Aged Out    Health Maintenance  Health Maintenance Due  Topic Date Due   FOOT EXAM  Never done  TETANUS/TDAP  Never done    Colorectal cancer screening: No longer required.   Mammogram status: No longer required due to age.  Bone Density status: Ordered today. Pt provided with contact info and advised to call to schedule appt.  Lung Cancer Screening: (Low Dose CT Chest recommended if Age 29-80 years, 30 pack-year currently smoking OR have quit w/in 15years.) does not qualify.   Lung Cancer Screening Referral: NA  Additional Screening:  Hepatitis C Screening: does not qualify  Vision Screening: Recommended annual ophthalmology exams for early detection of glaucoma and other disorders of the eye. Is the patient up to date with their annual eye exam?  Yes  Who is the provider or what is the name of the office in which the patient attends annual eye exams? Dr Edison Pace with [facility]  If pt is not established with a provider, would they like to be referred to a provider to establish care?  Patient already established, has an apt scheduled in January  .   Dental Screening: Recommended annual dental exams for proper oral hygiene  Community Resource Referral / Chronic Care Management: CRR required this visit?  No   CCM required this visit?  No      Plan:     I have personally reviewed and noted the following in the patients chart:   Medical and social history Use of alcohol, tobacco or illicit drugs  Current medications and supplements including opioid prescriptions.  Functional ability and status Nutritional status Physical activity Advanced directives List of other physicians Hospitalizations, surgeries, and ER visits in previous 12  months Vitals Screenings to include cognitive, depression, and falls Referrals and appointments  In addition, I have reviewed and discussed with patient certain preventive protocols, quality metrics, and best practice recommendations. A written personalized care plan for preventive services as well as general preventive health recommendations were provided to patient.     Lacretia Nicks, [place]   05/11/2021   Nurse Notes:  Vanessa Vazquez , Thank you for taking time to come for your Medicare Wellness Visit. I appreciate your ongoing commitment to your health goals. Please review the following plan we discussed and let me know if I can assist you in the future.   These are the goals we discussed:  Goals   None     This is a list of the screening recommended for you and due dates:  Health Maintenance  Topic Date Due   Complete foot exam   Never done   Tetanus Vaccine  Never done   COVID-19 Vaccine (1) 05/27/2021*   Zoster (Shingles) Vaccine (1 of 2) 07/19/2021*   Flu Shot  08/25/2021*   Pneumonia Vaccine (2 - PCV) 04/18/2022*   Eye exam for diabetics  06/23/2021   Hemoglobin A1C  07/19/2021   DEXA scan (bone density measurement)  Completed   HPV Vaccine  Aged Out  *Topic was postponed. The date shown is not the original due date.      Time spent 40 min

## 2021-05-12 ENCOUNTER — Ambulatory Visit: Payer: Medicare Other | Admitting: Internal Medicine

## 2021-05-12 NOTE — Progress Notes (Signed)
I have reviewed this visit and agree with the documentation.   

## 2021-05-25 ENCOUNTER — Other Ambulatory Visit: Payer: Self-pay | Admitting: Internal Medicine

## 2021-05-31 ENCOUNTER — Ambulatory Visit (INDEPENDENT_AMBULATORY_CARE_PROVIDER_SITE_OTHER): Payer: Medicare Other | Admitting: Internal Medicine

## 2021-05-31 ENCOUNTER — Encounter: Payer: Self-pay | Admitting: Internal Medicine

## 2021-05-31 ENCOUNTER — Other Ambulatory Visit: Payer: Self-pay

## 2021-05-31 VITALS — BP 141/60 | HR 88 | Ht 64.0 in | Wt 232.2 lb

## 2021-05-31 DIAGNOSIS — J301 Allergic rhinitis due to pollen: Secondary | ICD-10-CM

## 2021-05-31 DIAGNOSIS — J4520 Mild intermittent asthma, uncomplicated: Secondary | ICD-10-CM

## 2021-05-31 DIAGNOSIS — E119 Type 2 diabetes mellitus without complications: Secondary | ICD-10-CM

## 2021-05-31 DIAGNOSIS — M1711 Unilateral primary osteoarthritis, right knee: Secondary | ICD-10-CM | POA: Diagnosis not present

## 2021-05-31 DIAGNOSIS — I10 Essential (primary) hypertension: Secondary | ICD-10-CM

## 2021-05-31 LAB — GLUCOSE, POCT (MANUAL RESULT ENTRY): POC Glucose: 148 mg/dl — AB (ref 70–99)

## 2021-05-31 MED ORDER — METFORMIN HCL 1000 MG PO TABS: 1000.0000 mg | ORAL_TABLET | Freq: Two times a day (BID) | ORAL | 3 refills | Status: DC

## 2021-05-31 NOTE — Assessment & Plan Note (Signed)
Take Claritin 5 mg daily for allergy.

## 2021-05-31 NOTE — Assessment & Plan Note (Signed)
Stable at the present time. 

## 2021-05-31 NOTE — Assessment & Plan Note (Signed)
Right knee was injected with Kenalog shot patient was advised to lose weight.

## 2021-05-31 NOTE — Assessment & Plan Note (Signed)

## 2021-05-31 NOTE — Assessment & Plan Note (Signed)

## 2021-05-31 NOTE — Progress Notes (Signed)
Established Patient Office Visit  Subjective:  Patient ID: Vanessa Vazquez, female    DOB: March 31, 1935  Age: 87 y.o. MRN: [id number]  CC:  Chief Complaint  Patient presents with   Diabetes   Hypertension    Diabetes  Hypertension   Vanessa Vazquez presents for rt knee pain  Past Medical History:  Diagnosis Date   Asthma    Diabetes mellitus without complication ([place])    GERD (gastroesophageal reflux disease)    Hypercholesteremia    Hypertension     Past Surgical History:  Procedure Laterality Date   CESAREAN SECTION     COLONOSCOPY WITH PROPOFOL N/A 03/19/2016   Procedure: COLONOSCOPY WITH PROPOFOL;  Surgeon: Manya Silvas, MD;  Location: [facility];  Service: Endoscopy;  Laterality: N/A;    Family History  Problem Relation Age of Onset   Lung cancer Father    Leukemia Sister    Diabetes Sister    Asthma Child     Social History   Socioeconomic History   Marital status: Widowed    Spouse name: Not on file   Number of children: Not on file   Years of education: Not on file   Highest education level: GED or equivalent  Occupational History   Not on file  Tobacco Use   Smoking status: Former   Smokeless tobacco: Never  Substance and Sexual Activity   Alcohol use: No    Alcohol/week: 0.0 standard drinks   Drug use: No   Sexual activity: Never  Other Topics Concern   Not on file  Social History Narrative   Not on file   Social Determinants of Health   Financial Resource Strain: Low Risk    Difficulty of Paying Living Expenses: Not hard at all  Food Insecurity: No Food Insecurity   Worried About Charity fundraiser in the Last Year: Never true   [place] in the Last Year: Never true  Transportation Needs: No Transportation Needs   Lack of Transportation (Medical): No   Lack of Transportation (Non-Medical): No  Physical Activity: Insufficiently Active   Days of Exercise per Week: 5 days   Minutes of Exercise per Session: 10 min   Stress: No Stress Concern Present   Feeling of Stress : Not at all  Social Connections: Moderately Isolated   Frequency of Communication with Friends and Family: More than three times a week   Frequency of Social Gatherings with Friends and Family: More than three times a week   Attends Religious Services: More than 4 times per year   Active Member of [employer] or Organizations: No   Attends Archivist Meetings: Never   Marital Status: Widowed  Human resources officer Violence: Not At Risk   Fear of Current or Ex-Partner: No   Emotionally Abused: No   Physically Abused: No   Sexually Abused: No     Current Outpatient Medications:    albuterol (VENTOLIN HFA) 108 (90 Base) MCG/ACT inhaler, Inhale 2 puffs into the lungs every 4 (four) hours as needed for wheezing or shortness of breath., Disp: 1 each, Rfl: 0   atenolol (TENORMIN) 25 MG tablet, TAKE 1 TABLET BY MOUTH ONCE DAILY, Disp: 30 tablet, Rfl: 6   atorvastatin (LIPITOR) 40 MG tablet, Take 1 tablet (40 mg total) by mouth daily., Disp: 90 tablet, Rfl: 3   furosemide (LASIX) 20 MG tablet, Take 1 tablet (20 mg total) by mouth every other day., Disp: 30 tablet, Rfl: 3  glimepiride (AMARYL) 2 MG tablet, TAKE 1 TABLET BY MOUTH TWICE DAILY, Disp: 180 tablet, Rfl: 3   JANUVIA 100 MG tablet, TAKE 1 TABLET BY MOUTH ONCE DAILY, Disp: 90 tablet, Rfl: 3   loratadine (CLARITIN) 10 MG tablet, Take 1 tablet (10 mg total) by mouth daily., Disp: 30 tablet, Rfl: 3   losartan-hydrochlorothiazide (HYZAAR) 50-12.5 MG tablet, TAKE 2 TABLETS BY MOUTH ONCE DAILY, Disp: 180 tablet, Rfl: 2   methylPREDNISolone (MEDROL DOSEPAK) 4 MG TBPK tablet, Medrol dose pack  as directed 4 mg, Disp: 21 tablet, Rfl: 0   pregabalin (LYRICA) 50 MG capsule, TAKE 1 CAPSULE BY MOUTH TWICE DAILY, Disp: 60 capsule, Rfl: 6   Respiratory Therapy Supplies (NEBULIZER) DEVI, 1 Device by Does not apply route 2 (two) times daily as needed., Disp: 1 each, Rfl: 0   ferrous sulfate 300 (60  Fe) MG/5ML syrup, Take 5 mLs (300 mg total) by mouth daily., Disp: 150 mL, Rfl: 3   metFORMIN (GLUCOPHAGE) 1000 MG tablet, Take 1 tablet (1,000 mg total) by mouth 2 (two) times daily., Disp: 180 tablet, Rfl: 3   No Known Allergies  ROS Review of Systems  Constitutional: Negative.   HENT: Negative.    Eyes: Negative.   Respiratory: Negative.    Cardiovascular: Negative.   Gastrointestinal: Negative.   Endocrine: Negative.   Genitourinary: Negative.   Musculoskeletal: Negative.   Skin: Negative.   Allergic/Immunologic: Negative.   Neurological: Negative.   Hematological: Negative.   Psychiatric/Behavioral: Negative.    All other systems reviewed and are negative.    Objective:    Physical Exam Vitals reviewed.  Constitutional:      Appearance: Normal appearance.  HENT:     Mouth/Throat:     Mouth: Mucous membranes are moist.  Eyes:     Pupils: Pupils are equal, round, and reactive to light.  Neck:     Vascular: No carotid bruit.  Cardiovascular:     Rate and Rhythm: Normal rate and regular rhythm.     Pulses: Normal pulses.     Heart sounds: Normal heart sounds.  Pulmonary:     Effort: Pulmonary effort is normal.     Breath sounds: Normal breath sounds.  Abdominal:     General: Bowel sounds are normal.     Palpations: Abdomen is soft. There is no hepatomegaly, splenomegaly or mass.     Tenderness: There is no abdominal tenderness.     Hernia: No hernia is present.  Musculoskeletal:        General: Swelling present. No tenderness.     Cervical back: Neck supple.     Right lower leg: No edema.     Left lower leg: No edema.     Comments: Painful rt knee  Skin:    Findings: No rash.  Neurological:     Mental Status: She is alert and oriented to person, place, and time.     Motor: No weakness.  Psychiatric:        Mood and Affect: Mood and affect normal.        Behavior: Behavior normal.    BP (!) 141/60    Pulse 88    Ht _0  (1.626 m)    Wt 232 lb 3.2 oz  (105.3 kg)    BMI 39.86 kg/m  Wt Readings from Last 3 Encounters:  05/31/21 232 lb 3.2 oz (105.3 kg)  04/18/21 230 lb (104.3 kg)  02/15/21 230 lb 1.6 oz (104.4 kg)     Health  Maintenance Due  Topic Date Due   COVID-19 Vaccine (1) Never done   FOOT EXAM  Never done   TETANUS/TDAP  Never done    There are no preventive care reminders to display for this patient.  Lab Results  Component Value Date   TSH 0.84 01/16/2021   Lab Results  Component Value Date   WBC 4.6 01/16/2021   HGB 11.3 (L) 01/16/2021   HCT 35.4 01/16/2021   MCV 95.9 01/16/2021   PLT 205 01/16/2021   Lab Results  Component Value Date   NA 140 01/16/2021   K 3.8 01/16/2021   CO2 24 01/16/2021   GLUCOSE 125 (H) 01/16/2021   BUN 25 01/16/2021   CREATININE 0.90 01/16/2021   BILITOT 0.7 01/16/2021   ALKPHOS 91 09/29/2019   AST 21 01/16/2021   ALT 16 01/16/2021   PROT 6.9 01/16/2021   ALBUMIN 3.8 09/29/2019   CALCIUM 10.0 01/16/2021   ANIONGAP 12 10/20/2020   EGFR 63 01/16/2021   Lab Results  Component Value Date   CHOL 176 01/16/2021   Lab Results  Component Value Date   HDL 50 01/16/2021   Lab Results  Component Value Date   LDLCALC 103 (H) 01/16/2021   Lab Results  Component Value Date   TRIG 124 01/16/2021   Lab Results  Component Value Date   CHOLHDL 3.5 01/16/2021   Lab Results  Component Value Date   HGBA1C 5.8 (H) 01/16/2021      Assessment & Plan:   Problem List Items Addressed This Visit       Cardiovascular and Mediastinum   Essential hypertension     Patient denies any chest pain or shortness of breath there is no history of palpitation or paroxysmal nocturnal dyspnea   patient was advised to follow low-salt low-cholesterol diet    ideally I want to keep systolic blood pressure below 130 mmHg, patient was asked to check blood pressure one times a week and give me a report on that.  Patient will be follow-up in 3 months  or earlier as needed, patient will call me  back for any change in the cardiovascular symptoms Patient was advised to buy a book from local bookstore concerning blood pressure and read several chapters  every day.  This will be supplemented by some of the material we will give him from the office.  Patient should also utilize other resources like YouTube and Internet to learn more about the blood pressure and the diet.        Respiratory   Asthma    Stable at the present time.      Seasonal allergic rhinitis due to pollen    Take Claritin 5 mg daily for allergy.        Endocrine   Type 2 diabetes mellitus without complication, without long-term current use of insulin ([place]) - Primary    - The patient's blood sugar is labile on med. - The patient will continue the current treatment regimen.  - I encouraged the patient to regularly check blood sugar.  - I encouraged the patient to monitor diet. I encouraged the patient to eat low-carb and low-sugar to help prevent blood sugar spikes.  - I encouraged the patient to continue following their prescribed treatment plan for diabetes - I informed the patient to get help if blood sugar drops below 59m/dL, or if suddenly have trouble thinking clearly or breathing.  Patient was advised to buy a book on diabetes from a local  bookstore or from [employer].  Patient should read 2 chapters every day to keep the motivation going, this is in addition to some of the materials we provided them from the office.  There are other resources on the Internet like YouTube and wilkipedia to get an education on the diabetes      Relevant Medications   metFORMIN (GLUCOPHAGE) 1000 MG tablet   Other Relevant Orders   POCT glucose (manual entry) (Completed)     Musculoskeletal and Integument   Primary osteoarthritis of right knee    Right knee was injected with Kenalog shot patient was advised to lose weight.      Joint Injection/Arthrocentesis  Date/Time: 05/31/2021 10:24 AM Performed by: Cletis Athens,  MD Authorized by: Cletis Athens, MD  Indications: joint swelling and pain  Body area: knee Local anesthesia used: yes  Anesthesia: Local anesthesia used: yes Local Anesthetic: lidocaine 1% with epinephrine Needle size: 22 G Ultrasound guidance: no Approach: lateral Aspirate: clear Betamethasone amount: 40 mg Comments: Right knee was prepared with alcohol and under sterile conditions 1 cc of lidocaine was injected on the right side of the knee followed by 40 mg of Kenalog, patient tolerated the procedure well and she was advised to stay in the waiting area for 5 minutes       Follow-up: No follow-ups on file.    Cletis Athens, MD

## 2021-06-08 ENCOUNTER — Other Ambulatory Visit: Payer: Self-pay | Admitting: Internal Medicine

## 2021-06-26 ENCOUNTER — Other Ambulatory Visit: Payer: Self-pay | Admitting: *Deleted

## 2021-06-26 MED ORDER — LORATADINE 10 MG PO TABS: 10.0000 mg | ORAL_TABLET | Freq: Every day | ORAL | 3 refills | Status: DC

## 2021-06-26 MED ORDER — ALBUTEROL SULFATE HFA 108 (90 BASE) MCG/ACT IN AERS: 2.0000 | INHALATION_SPRAY | RESPIRATORY_TRACT | 6 refills | Status: DC | PRN

## 2021-07-26 ENCOUNTER — Ambulatory Visit
Admission: RE | Admit: 2021-07-26 | Discharge: 2021-07-26 | Disposition: A | Discharge status: Home / Self Care | Payer: Medicare Other | Source: Ambulatory Visit | Attending: Internal Medicine | Admitting: Internal Medicine

## 2021-07-26 ENCOUNTER — Other Ambulatory Visit: Payer: Self-pay

## 2021-07-26 DIAGNOSIS — Z13820 Encounter for screening for osteoporosis: Secondary | ICD-10-CM | POA: Diagnosis not present

## 2021-07-26 DIAGNOSIS — Q782 Osteopetrosis: Secondary | ICD-10-CM | POA: Insufficient documentation

## 2021-07-26 DIAGNOSIS — Z780 Asymptomatic menopausal state: Secondary | ICD-10-CM | POA: Insufficient documentation

## 2021-07-26 DIAGNOSIS — E119 Type 2 diabetes mellitus without complications: Secondary | ICD-10-CM | POA: Diagnosis not present

## 2021-07-26 DIAGNOSIS — M85852 Other specified disorders of bone density and structure, left thigh: Secondary | ICD-10-CM | POA: Diagnosis not present

## 2021-07-28 ENCOUNTER — Ambulatory Visit (INDEPENDENT_AMBULATORY_CARE_PROVIDER_SITE_OTHER): Payer: Medicare Other

## 2021-07-28 ENCOUNTER — Other Ambulatory Visit: Payer: Self-pay

## 2021-07-28 DIAGNOSIS — I70229 Atherosclerosis of native arteries of extremities with rest pain, unspecified extremity: Secondary | ICD-10-CM

## 2021-07-31 ENCOUNTER — Other Ambulatory Visit: Payer: Self-pay

## 2021-07-31 ENCOUNTER — Ambulatory Visit (INDEPENDENT_AMBULATORY_CARE_PROVIDER_SITE_OTHER): Payer: Medicare Other | Admitting: Internal Medicine

## 2021-07-31 ENCOUNTER — Encounter: Payer: Self-pay | Admitting: Internal Medicine

## 2021-07-31 VITALS — BP 139/65 | HR 71 | Ht 64.0 in | Wt 235.5 lb

## 2021-07-31 DIAGNOSIS — I10 Essential (primary) hypertension: Secondary | ICD-10-CM

## 2021-07-31 DIAGNOSIS — J4520 Mild intermittent asthma, uncomplicated: Secondary | ICD-10-CM | POA: Diagnosis not present

## 2021-07-31 DIAGNOSIS — J301 Allergic rhinitis due to pollen: Secondary | ICD-10-CM

## 2021-07-31 DIAGNOSIS — Z6841 Body Mass Index (BMI) 40.0 and over, adult: Secondary | ICD-10-CM

## 2021-07-31 DIAGNOSIS — E118 Type 2 diabetes mellitus with unspecified complications: Secondary | ICD-10-CM

## 2021-07-31 DIAGNOSIS — E119 Type 2 diabetes mellitus without complications: Secondary | ICD-10-CM | POA: Diagnosis not present

## 2021-07-31 LAB — GLUCOSE, POCT (MANUAL RESULT ENTRY): POC Glucose: 205 mg/dl — AB (ref 70–99)

## 2021-07-31 NOTE — Assessment & Plan Note (Signed)

## 2021-07-31 NOTE — Assessment & Plan Note (Signed)

## 2021-07-31 NOTE — Assessment & Plan Note (Signed)
Take Claritin 5 mg p.o. daily 

## 2021-07-31 NOTE — Assessment & Plan Note (Signed)
Behavioral modification strategies: increasing lean protein intake, decreasing simple carbohydrates, increasing vegetables, increasing water intake, decreasing eating out, no skipping meals, meal planning and cooking strategies, keeping healthy foods in the home and planning for success. 

## 2021-07-31 NOTE — Assessment & Plan Note (Signed)
Stable at the present time. 

## 2021-07-31 NOTE — Assessment & Plan Note (Signed)
Circulation intact in both feet, sensation is okay ?

## 2021-07-31 NOTE — Progress Notes (Signed)
Established Patient Office Visit  Subjective:  Patient ID: Vanessa Vazquez, female    DOB: [birth year]  Age: 87 y.o. MRN: [id number]  CC:  Chief Complaint  Patient presents with   Diabetes   Hypertension    Diabetes  Hypertension   Vanessa Vazquez presents for check up Past Medical History:  Diagnosis Date   Asthma    Diabetes mellitus without complication ([place])    GERD (gastroesophageal reflux disease)    Hypercholesteremia    Hypertension     Past Surgical History:  Procedure Laterality Date   CESAREAN SECTION     COLONOSCOPY WITH PROPOFOL N/A 03/19/2016   Procedure: COLONOSCOPY WITH PROPOFOL;  Surgeon: Manya Silvas, MD;  Location: [facility];  Service: Endoscopy;  Laterality: N/A;    Family History  Problem Relation Age of Onset   Lung cancer Father    Leukemia Sister    Diabetes Sister    Asthma Child     Social History   Socioeconomic History   Marital status: Widowed    Spouse name: Not on file   Number of children: Not on file   Years of education: Not on file   Highest education level: GED or equivalent  Occupational History   Not on file  Tobacco Use   Smoking status: Former   Smokeless tobacco: Never  Substance and Sexual Activity   Alcohol use: No    Alcohol/week: 0.0 standard drinks   Drug use: No   Sexual activity: Never  Other Topics Concern   Not on file  Social History Narrative   Not on file   Social Determinants of Health   Financial Resource Strain: Low Risk    Difficulty of Paying Living Expenses: Not hard at all  Food Insecurity: No Food Insecurity   Worried About Charity fundraiser in the Last Year: Never true   [place] in the Last Year: Never true  Transportation Needs: No Transportation Needs   Lack of Transportation (Medical): No   Lack of Transportation (Non-Medical): No  Physical Activity: Insufficiently Active   Days of Exercise per Week: 5 days   Minutes of Exercise per Session: 10 min  Stress: No  Stress Concern Present   Feeling of Stress : Not at all  Social Connections: Moderately Isolated   Frequency of Communication with Friends and Family: More than three times a week   Frequency of Social Gatherings with Friends and Family: More than three times a week   Attends Religious Services: More than 4 times per year   Active Member of [employer] or Organizations: No   Attends Archivist Meetings: Never   Marital Status: Widowed  Human resources officer Violence: Not At Risk   Fear of Current or Ex-Partner: No   Emotionally Abused: No   Physically Abused: No   Sexually Abused: No     Current Outpatient Medications:    albuterol (VENTOLIN HFA) 108 (90 Base) MCG/ACT inhaler, Inhale 2 puffs into the lungs every 4 (four) hours as needed for wheezing or shortness of breath., Disp: 18 g, Rfl: 6   atenolol (TENORMIN) 25 MG tablet, TAKE 1 TABLET BY MOUTH ONCE DAILY, Disp: 30 tablet, Rfl: 6   atorvastatin (LIPITOR) 40 MG tablet, Take 1 tablet (40 mg total) by mouth daily., Disp: 90 tablet, Rfl: 3   furosemide (LASIX) 20 MG tablet, TAKE 1 TABLET BY MOUTH EVERY OTHER DAY, Disp: 30 tablet, Rfl: 3   glimepiride (AMARYL) 2 MG tablet,  TAKE 1 TABLET BY MOUTH TWICE DAILY, Disp: 180 tablet, Rfl: 3   JANUVIA 100 MG tablet, TAKE 1 TABLET BY MOUTH ONCE DAILY, Disp: 90 tablet, Rfl: 3   loratadine (CLARITIN) 10 MG tablet, Take 1 tablet (10 mg total) by mouth daily., Disp: 90 tablet, Rfl: 3   losartan-hydrochlorothiazide (HYZAAR) 50-12.5 MG tablet, TAKE 2 TABLETS BY MOUTH ONCE DAILY, Disp: 180 tablet, Rfl: 2   metFORMIN (GLUCOPHAGE) 1000 MG tablet, Take 1 tablet (1,000 mg total) by mouth 2 (two) times daily., Disp: 180 tablet, Rfl: 3   pregabalin (LYRICA) 50 MG capsule, TAKE 1 CAPSULE BY MOUTH TWICE DAILY, Disp: 60 capsule, Rfl: 6   Respiratory Therapy Supplies (NEBULIZER) DEVI, 1 Device by Does not apply route 2 (two) times daily as needed., Disp: 1 each, Rfl: 0   ferrous sulfate 300 (60 Fe) MG/5ML syrup,  Take 5 mLs (300 mg total) by mouth daily., Disp: 150 mL, Rfl: 3   No Known Allergies  ROS Review of Systems  Constitutional: Negative.   HENT: Negative.    Eyes: Negative.   Respiratory: Negative.    Cardiovascular: Negative.   Gastrointestinal: Negative.   Endocrine: Negative.   Genitourinary: Negative.   Musculoskeletal: Negative.   Skin: Negative.   Allergic/Immunologic: Negative.   Neurological: Negative.   Hematological: Negative.   Psychiatric/Behavioral: Negative.    All other systems reviewed and are negative.    Objective:    Physical Exam Vitals reviewed.  Constitutional:      Appearance: Normal appearance.  HENT:     Mouth/Throat:     Mouth: Mucous membranes are moist.  Eyes:     Pupils: Pupils are equal, round, and reactive to light.  Neck:     Vascular: No carotid bruit.  Cardiovascular:     Rate and Rhythm: Normal rate and regular rhythm.     Pulses: Normal pulses.     Heart sounds: Normal heart sounds.  Pulmonary:     Effort: Pulmonary effort is normal.     Breath sounds: Normal breath sounds.  Abdominal:     General: Bowel sounds are normal.     Palpations: Abdomen is soft. There is no hepatomegaly, splenomegaly or mass.     Tenderness: There is no abdominal tenderness.     Hernia: No hernia is present.  Musculoskeletal:        General: No tenderness.     Cervical back: Neck supple.     Right lower leg: No edema.     Left lower leg: No edema.  Skin:    Findings: No rash.  Neurological:     Mental Status: She is alert and oriented to person, place, and time.     Motor: No weakness.  Psychiatric:        Mood and Affect: Mood and affect normal.        Behavior: Behavior normal.    BP 139/65    Pulse 71    Ht _0  (1.626 m)    Wt 235 lb 8 oz (106.8 kg)    BMI 40.42 kg/m  Wt Readings from Last 3 Encounters:  07/31/21 235 lb 8 oz (106.8 kg)  05/31/21 232 lb 3.2 oz (105.3 kg)  04/18/21 230 lb (104.3 kg)     Health Maintenance Due   Topic Date Due   COVID-19 Vaccine (1) Never done   FOOT EXAM  Never done   TETANUS/TDAP  Never done   Zoster Vaccines- Shingrix (1 of 2) Never done  OPHTHALMOLOGY EXAM  06/23/2021   HEMOGLOBIN A1C  07/19/2021    There are no preventive care reminders to display for this patient.  Lab Results  Component Value Date   TSH 0.84 01/16/2021   Lab Results  Component Value Date   WBC 4.6 01/16/2021   HGB 11.3 (L) 01/16/2021   HCT 35.4 01/16/2021   MCV 95.9 01/16/2021   PLT 205 01/16/2021   Lab Results  Component Value Date   NA 140 01/16/2021   K 3.8 01/16/2021   CO2 24 01/16/2021   GLUCOSE 125 (H) 01/16/2021   BUN 25 01/16/2021   CREATININE 0.90 01/16/2021   BILITOT 0.7 01/16/2021   ALKPHOS 91 09/29/2019   AST 21 01/16/2021   ALT 16 01/16/2021   PROT 6.9 01/16/2021   ALBUMIN 3.8 09/29/2019   CALCIUM 10.0 01/16/2021   ANIONGAP 12 10/20/2020   EGFR 63 01/16/2021   Lab Results  Component Value Date   CHOL 176 01/16/2021   Lab Results  Component Value Date   HDL 50 01/16/2021   Lab Results  Component Value Date   LDLCALC 103 (H) 01/16/2021   Lab Results  Component Value Date   TRIG 124 01/16/2021   Lab Results  Component Value Date   CHOLHDL 3.5 01/16/2021   Lab Results  Component Value Date   HGBA1C 5.8 (H) 01/16/2021      Assessment & Plan:   Problem List Items Addressed This Visit       Cardiovascular and Mediastinum   Essential hypertension     Patient denies any chest pain or shortness of breath there is no history of palpitation or paroxysmal nocturnal dyspnea   patient was advised to follow low-salt low-cholesterol diet    ideally I want to keep systolic blood pressure below 130 mmHg, patient was asked to check blood pressure one times a week and give me a report on that.  Patient will be follow-up in 3 months  or earlier as needed, patient will call me back for any change in the cardiovascular symptoms Patient was advised to buy a book  from local bookstore concerning blood pressure and read several chapters  every day.  This will be supplemented by some of the material we will give him from the office.  Patient should also utilize other resources like YouTube and Internet to learn more about the blood pressure and the diet.        Respiratory   Asthma    Stable at the present time      Seasonal allergic rhinitis due to pollen    Take Claritin 5 mg p.o. daily        Endocrine   Type 2 diabetes mellitus without complication, without long-term current use of insulin ([place]) - Primary    - The patient's blood sugar is labile on med. - The patient will continue the current treatment regimen.  - I encouraged the patient to regularly check blood sugar.  - I encouraged the patient to monitor diet. I encouraged the patient to eat low-carb and low-sugar to help prevent blood sugar spikes.  - I encouraged the patient to continue following their prescribed treatment plan for diabetes - I informed the patient to get help if blood sugar drops below 60m/dL, or if suddenly have trouble thinking clearly or breathing.  Patient was advised to buy a book on diabetes from a local bookstore or from [place]  Patient should read 2 chapters every day to keep the motivation going,  this is in addition to some of the materials we provided them from the office.  There are other resources on the Internet like YouTube and [employer]kipedia to get an education on the diabetes      Relevant Orders   POCT glucose (manual entry) (Completed)   Diabetic foot ([place])    Circulation intact in both feet, sensation is okay        Other   Class 3 severe obesity due to excess calories with serious comorbidity and body mass index (BMI) of 40.0 to 44.9 in adult [facility])    Behavioral modification strategies: increasing lean protein intake, decreasing simple carbohydrates, increasing vegetables, increasing water intake, decreasing eating out, no skipping meals, meal  planning and cooking strategies, keeping healthy foods in the home and planning for success.       No orders of the defined types were placed in this encounter.   Follow-up: No follow-ups on file.    Cletis Athens, MD

## 2021-08-09 IMAGING — US US EXTREM LOW VENOUS
1 series · 13 of 24 positions shown · non-contrast
Comparison: None.

CLINICAL DATA: Bilateral lower extremity edema for the past day.
Evaluate for DVT.



[Series 1: us venous img lower bilat (dvt) · portal-venous · 13 of 72 slices shown]
[im 1/72]
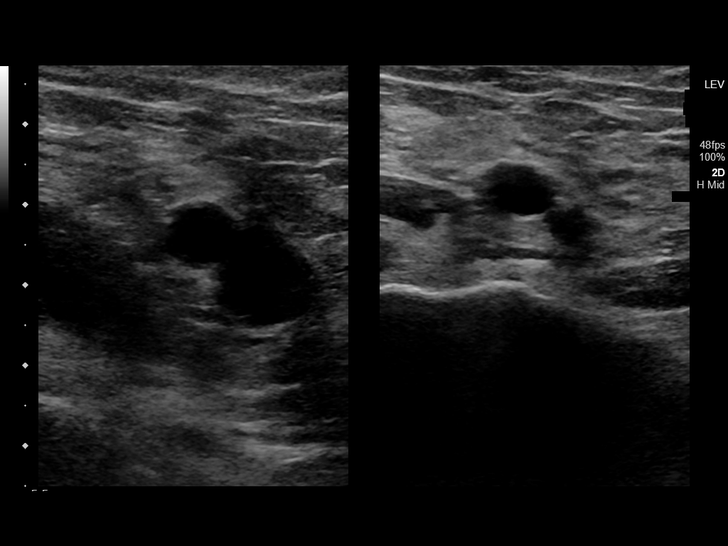
[im 7/72]
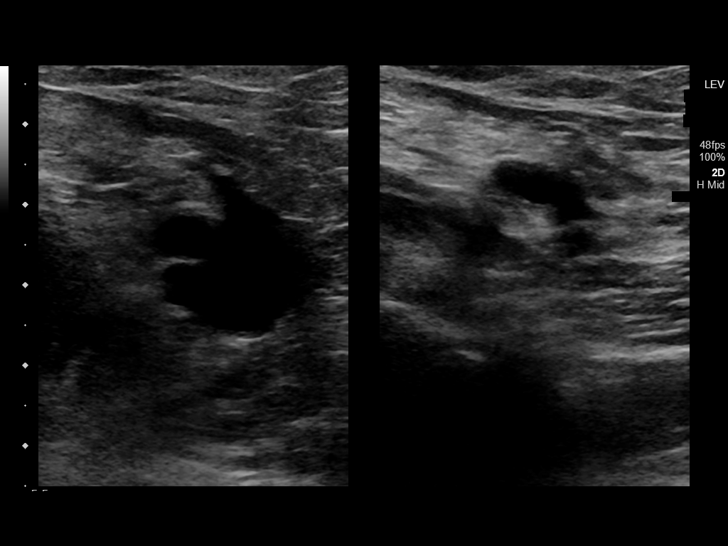
[im 13/72]
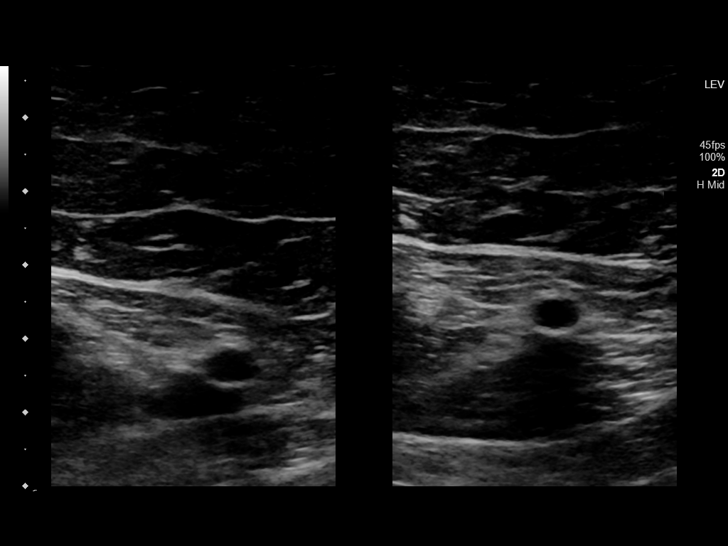
[im 19/72]
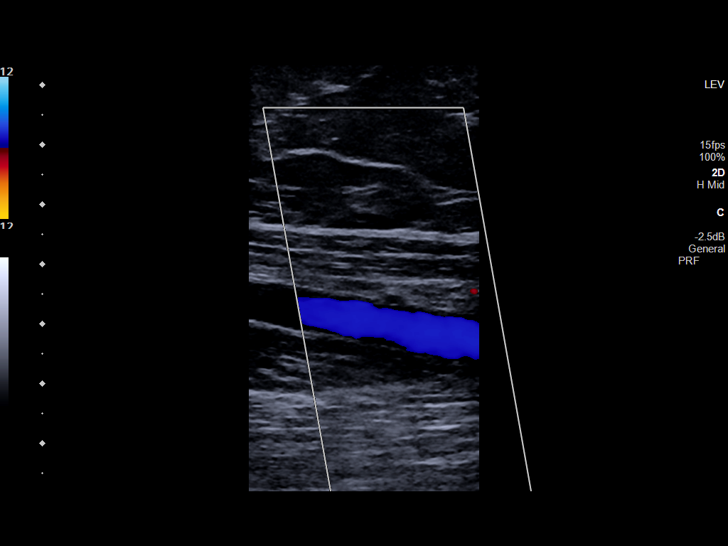
[im 25/72]
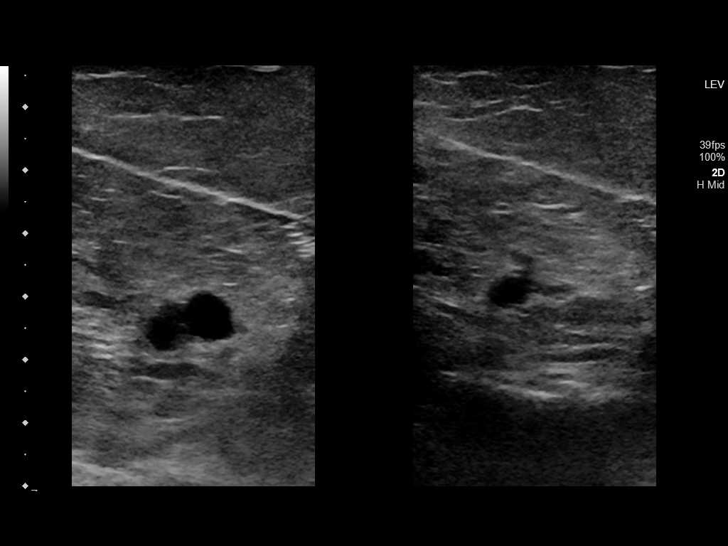
[im 31/72]
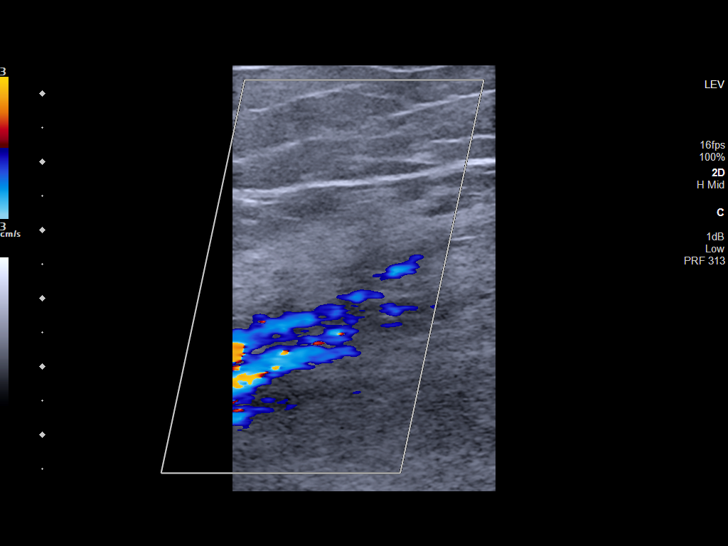
[im 38/72]
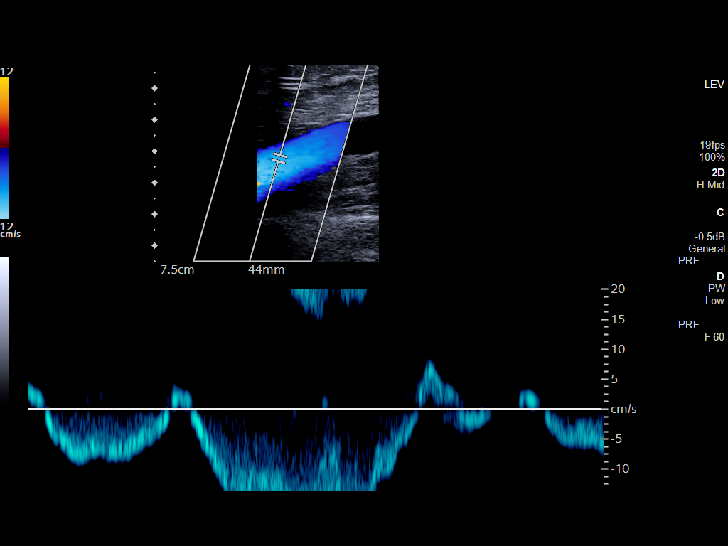
[im 41/72]
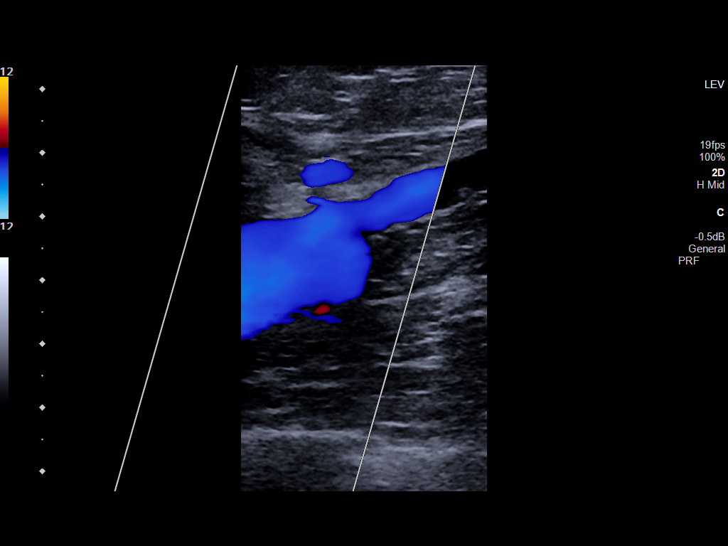
[im 47/72]
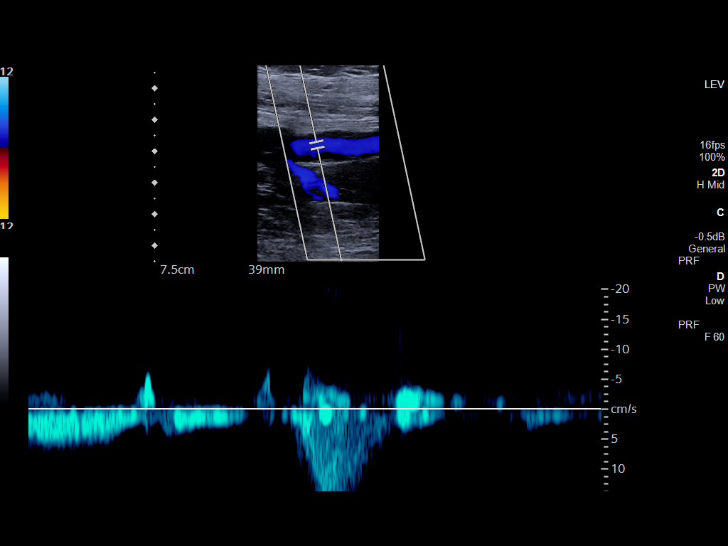
[im 53/72]
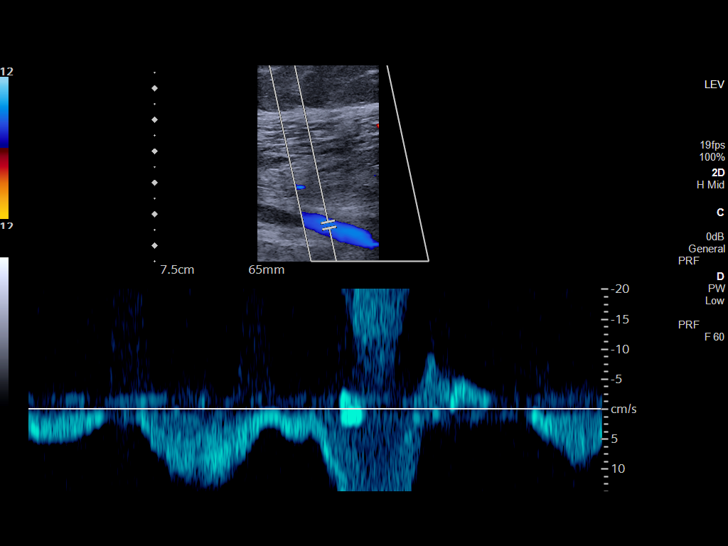
[im 59/72]
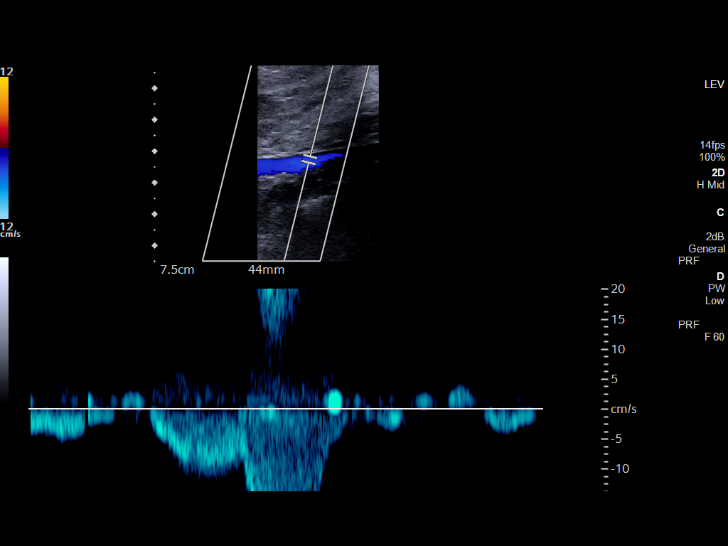
[im 65/72]
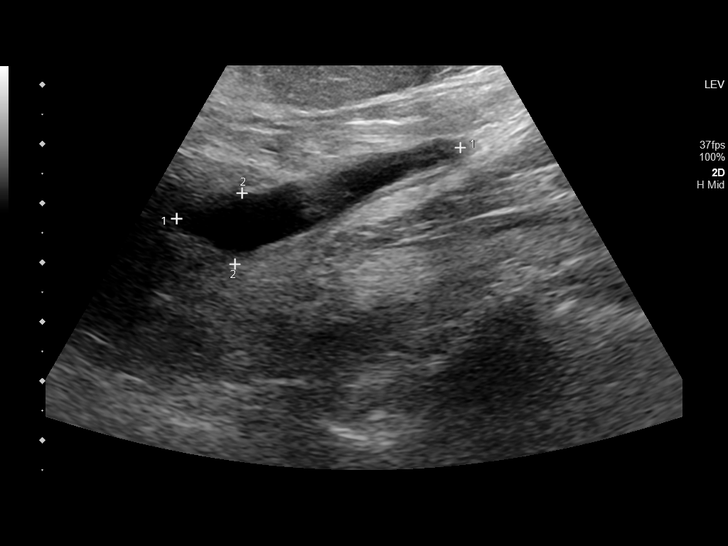
[im 72/72]
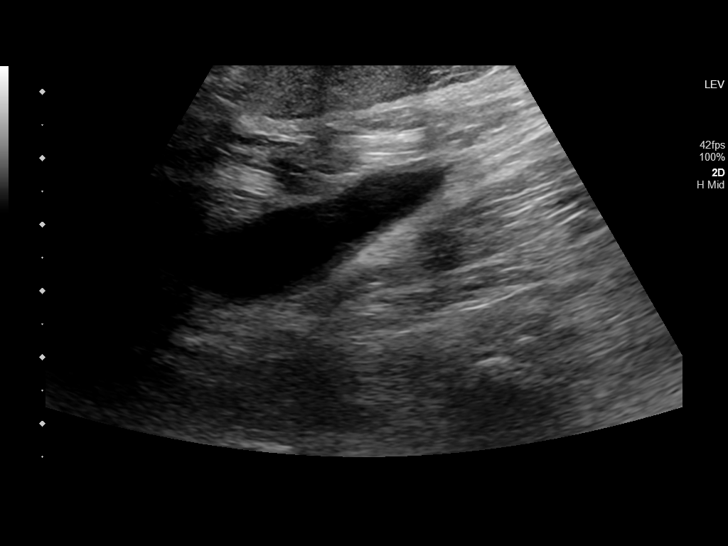

[13 of 24 positions shown; findings below may reference images not displayed]

FINDINGS: RIGHT LOWER EXTREMITY

Common Femoral Vein: No evidence of thrombus. Normal
compressibility, respiratory phasicity and response to augmentation.

Saphenofemoral Junction: No evidence of thrombus. Normal
compressibility and flow on color Doppler imaging.

Profunda Femoral Vein: No evidence of thrombus. Normal
compressibility and flow on color Doppler imaging.

Femoral Vein: No evidence of thrombus. Normal compressibility,
respiratory phasicity and response to augmentation.

Popliteal Vein: No evidence of thrombus. Normal compressibility,
respiratory phasicity and response to augmentation.

Calf Veins: No evidence of thrombus. Normal compressibility and flow
on color Doppler imaging.

Superficial Great Saphenous Vein: No evidence of thrombus. Normal
compressibility.

Venous Reflux:  None.

Other Findings:  None.

LEFT LOWER EXTREMITY

Common Femoral Vein: No evidence of thrombus. Normal
compressibility, respiratory phasicity and response to augmentation.

Saphenofemoral Junction: No evidence of thrombus. Normal
compressibility and flow on color Doppler imaging.

Profunda Femoral Vein: No evidence of thrombus. Normal
compressibility and flow on color Doppler imaging.

Femoral Vein: No evidence of thrombus. Normal compressibility,
respiratory phasicity and response to augmentation.

Popliteal Vein: No evidence of thrombus. Normal compressibility,
respiratory phasicity and response to augmentation.

Calf Veins: No evidence of thrombus. Normal compressibility and flow
on color Doppler imaging.

Superficial Great Saphenous Vein: No evidence of thrombus. Normal
compressibility.

Venous Reflux:  None.

Other Findings: Note is made of an approximately 4.9 x 1.2 x 3.2 cm
serpiginous fluid collection within the left popliteal fossa
compatible with a Baker's cyst.
IMPRESSION: No evidence of DVT within either lower extremity.

## 2021-09-27 ENCOUNTER — Ambulatory Visit: Payer: Medicare Other | Admitting: Internal Medicine

## 2021-10-03 ENCOUNTER — Ambulatory Visit
Admission: RE | Admit: 2021-10-03 | Discharge: 2021-10-03 | Disposition: A | Discharge status: Home / Self Care | Payer: Medicare Other | Source: Ambulatory Visit | Attending: Internal Medicine | Admitting: Internal Medicine

## 2021-10-03 ENCOUNTER — Ambulatory Visit (INDEPENDENT_AMBULATORY_CARE_PROVIDER_SITE_OTHER): Payer: Medicare Other | Admitting: Internal Medicine

## 2021-10-03 ENCOUNTER — Encounter: Payer: Self-pay | Admitting: Internal Medicine

## 2021-10-03 VITALS — BP 140/76 | HR 91 | Ht 64.0 in | Wt 230.8 lb

## 2021-10-03 DIAGNOSIS — Z6841 Body Mass Index (BMI) 40.0 and over, adult: Secondary | ICD-10-CM

## 2021-10-03 DIAGNOSIS — E7800 Pure hypercholesterolemia, unspecified: Secondary | ICD-10-CM

## 2021-10-03 DIAGNOSIS — R079 Chest pain, unspecified: Secondary | ICD-10-CM

## 2021-10-03 DIAGNOSIS — E119 Type 2 diabetes mellitus without complications: Secondary | ICD-10-CM

## 2021-10-03 DIAGNOSIS — I10 Essential (primary) hypertension: Secondary | ICD-10-CM

## 2021-10-03 DIAGNOSIS — E118 Type 2 diabetes mellitus with unspecified complications: Secondary | ICD-10-CM

## 2021-10-03 DIAGNOSIS — R2681 Unsteadiness on feet: Secondary | ICD-10-CM | POA: Diagnosis not present

## 2021-10-03 DIAGNOSIS — J4520 Mild intermittent asthma, uncomplicated: Secondary | ICD-10-CM | POA: Diagnosis not present

## 2021-10-03 LAB — GLUCOSE, POCT (MANUAL RESULT ENTRY): POC Glucose: 117 mg/dl — AB (ref 70–99)

## 2021-10-03 MED ORDER — OMEPRAZOLE 20 MG PO CPDR: 20.0000 mg | DELAYED_RELEASE_CAPSULE | Freq: Every day | ORAL | 3 refills | Status: DC

## 2021-10-03 MED ORDER — WALKER MISC: 1.0000 | Freq: Every day | 0 refills | Status: AC

## 2021-10-03 NOTE — Assessment & Plan Note (Signed)
Asthma is stable at the present time.  She complains of chest pain on the left side of the chest ?Electrocardiogram does not show any acute changes. ?

## 2021-10-03 NOTE — Assessment & Plan Note (Signed)
Blood sugar is 117 ?

## 2021-10-03 NOTE — Assessment & Plan Note (Signed)

## 2021-10-03 NOTE — Assessment & Plan Note (Addendum)
?    patient was advised to follow low-salt low-cholesterol diet ? ?  ideally I want to keep systolic blood pressure below 130 mmHg, patient was asked to check blood pressure one times a week and give me a report on that.  Patient will be follow-up in 3 months  or earlier as needed, patient will call me back for any change in the cardiovascular symptoms ?Patient was advised to buy a book from local bookstore concerning blood pressure and read several chapters  every day.  This will be supplemented by some of the material we will give him from the office.  Patient should also utilize other resources like YouTube and Internet to learn more about the blood pressure and the diet. ?

## 2021-10-03 NOTE — Assessment & Plan Note (Signed)

## 2021-10-03 NOTE — Progress Notes (Signed)
? ?Established Patient Office Visit ? ?Subjective:  ?Patient ID: Vanessa Vazquez, female    DOB: 1935-01-20  Age: 87 y.o. MRN: [id number] ? ?CC:  ?Chief Complaint  ?Patient presents with  ? Diabetes  ?  2 month follow up  ? ? ?Diabetes ?Pertinent negatives for hypoglycemia include no dizziness. Associated symptoms include chest pain.  ?Chest Pain  ?The current episode started in the past 7 days. The onset quality is sudden. The problem occurs 2 to 4 times per day. The problem has been waxing and waning. The pain is at a severity of 3/10. The pain is mild. The pain does not radiate. Associated symptoms include exertional chest pressure. Pertinent negatives include no abdominal pain, back pain, cough, diaphoresis, dizziness, irregular heartbeat, leg pain, nausea, near-syncope, palpitations, shortness of breath or syncope.  ? ?Vanessa Vazquez presents for chest pain ? ?Past Medical History:  ?Diagnosis Date  ? Asthma   ? Diabetes mellitus without complication ([place])   ? GERD (gastroesophageal reflux disease)   ? Hypercholesteremia   ? Hypertension   ? ? ?Past Surgical History:  ?Procedure Laterality Date  ? CESAREAN SECTION    ? COLONOSCOPY WITH PROPOFOL N/A 03/19/2016  ? Procedure: COLONOSCOPY WITH PROPOFOL;  Surgeon: Manya Silvas, MD;  Location: [facility];  Service: Endoscopy;  Laterality: N/A;  ? ? ?Family History  ?Problem Relation Age of Onset  ? Lung cancer Father   ? Leukemia Sister   ? Diabetes Sister   ? Asthma Child   ? ? ?Social History  ? ?Socioeconomic History  ? Marital status: Widowed  ?  Spouse name: Not on file  ? Number of children: Not on file  ? Years of education: Not on file  ? Highest education level: GED or equivalent  ?Occupational History  ? Not on file  ?Tobacco Use  ? Smoking status: Former  ? Smokeless tobacco: Never  ?Substance and Sexual Activity  ? Alcohol use: No  ?  Alcohol/week: 0.0 standard drinks  ? Drug use: No  ? Sexual activity: Never  ?Other Topics Concern  ? Not on file   ?Social History Narrative  ? Not on file  ? ?Social Determinants of Health  ? ?Financial Resource Strain: Low Risk   ? Difficulty of Paying Living Expenses: Not hard at all  ?Food Insecurity: No Food Insecurity  ? Worried About Charity fundraiser in the Last Year: Never true  ? Ran Out of Food in the Last Year: Never true  ?Transportation Needs: No Transportation Needs  ? Lack of Transportation (Medical): No  ? Lack of Transportation (Non-Medical): No  ?Physical Activity: Insufficiently Active  ? Days of Exercise per Week: 5 days  ? Minutes of Exercise per Session: 10 min  ?Stress: No Stress Concern Present  ? Feeling of Stress : Not at all  ?Social Connections: Moderately Isolated  ? Frequency of Communication with Friends and Family: More than three times a week  ? Frequency of Social Gatherings with Friends and Family: More than three times a week  ? Attends Religious Services: More than 4 times per year  ? Active Member of Clubs or Organizations: No  ? Attends Archivist Meetings: Never  ? Marital Status: Widowed  ?Intimate Partner Violence: Not At Risk  ? Fear of Current or Ex-Partner: No  ? Emotionally Abused: No  ? Physically Abused: No  ? Sexually Abused: No  ? ? ? ?Current Outpatient Medications:  ?  albuterol (VENTOLIN HFA)  108 (90 Base) MCG/ACT inhaler, Inhale 2 puffs into the lungs every 4 (four) hours as needed for wheezing or shortness of breath., Disp: 18 g, Rfl: 6 ?  atenolol (TENORMIN) 25 MG tablet, TAKE 1 TABLET BY MOUTH ONCE DAILY, Disp: 30 tablet, Rfl: 6 ?  atorvastatin (LIPITOR) 40 MG tablet, Take 1 tablet (40 mg total) by mouth daily., Disp: 90 tablet, Rfl: 3 ?  furosemide (LASIX) 20 MG tablet, TAKE 1 TABLET BY MOUTH EVERY OTHER DAY, Disp: 30 tablet, Rfl: 3 ?  glimepiride (AMARYL) 2 MG tablet, TAKE 1 TABLET BY MOUTH TWICE DAILY, Disp: 180 tablet, Rfl: 3 ?  JANUVIA 100 MG tablet, TAKE 1 TABLET BY MOUTH ONCE DAILY, Disp: 90 tablet, Rfl: 3 ?  loratadine (CLARITIN) 10 MG tablet, Take 1  tablet (10 mg total) by mouth daily., Disp: 90 tablet, Rfl: 3 ?  losartan-hydrochlorothiazide (HYZAAR) 50-12.5 MG tablet, TAKE 2 TABLETS BY MOUTH ONCE DAILY, Disp: 180 tablet, Rfl: 2 ?  metFORMIN (GLUCOPHAGE) 1000 MG tablet, Take 1 tablet (1,000 mg total) by mouth 2 (two) times daily., Disp: 180 tablet, Rfl: 3 ?  Misc. Devices (WALKER) MISC, 1 each by Does not apply route daily., Disp: 1 each, Rfl: 0 ?  pregabalin (LYRICA) 50 MG capsule, TAKE 1 CAPSULE BY MOUTH TWICE DAILY, Disp: 60 capsule, Rfl: 6 ?  Respiratory Therapy Supplies (NEBULIZER) DEVI, 1 Device by Does not apply route 2 (two) times daily as needed., Disp: 1 each, Rfl: 0 ?  ferrous sulfate 300 (60 Fe) MG/5ML syrup, Take 5 mLs (300 mg total) by mouth daily., Disp: 150 mL, Rfl: 3  ? ?No Known Allergies ? ?ROS ?Review of Systems  ?Constitutional:  Negative for diaphoresis.  ?Respiratory:  Negative for cough and shortness of breath.   ?Cardiovascular:  Positive for chest pain. Negative for palpitations, syncope and near-syncope.  ?Gastrointestinal:  Negative for abdominal pain and nausea.  ?Musculoskeletal:  Negative for back pain.  ?Neurological:  Negative for dizziness.  ? ?  ?Objective:  ?  ?Physical Exam ?Vitals reviewed.  ?Constitutional:   ?   Appearance: Normal appearance.  ?HENT:  ?   Mouth/Throat:  ?   Mouth: Mucous membranes are moist.  ?Eyes:  ?   Pupils: Pupils are equal, round, and reactive to light.  ?Neck:  ?   Vascular: No carotid bruit.  ?Cardiovascular:  ?   Rate and Rhythm: Normal rate and regular rhythm.  ?   Pulses: Normal pulses.  ?   Heart sounds: Normal heart sounds.  ?Pulmonary:  ?   Effort: Pulmonary effort is normal.  ?   Breath sounds: Normal breath sounds.  ?Abdominal:  ?   General: Bowel sounds are normal.  ?   Palpations: Abdomen is soft. There is no hepatomegaly, splenomegaly or mass.  ?   Tenderness: There is no abdominal tenderness.  ?   Hernia: No hernia is present.  ?Musculoskeletal:     ?   General: No tenderness.  ?    Cervical back: Neck supple.  ?   Right lower leg: No edema.  ?   Left lower leg: No edema.  ?Skin: ?   Findings: No rash.  ?Neurological:  ?   Mental Status: She is alert and oriented to person, place, and time.  ?   Motor: No weakness.  ?Psychiatric:     ?   Mood and Affect: Mood and affect normal.     ?   Behavior: Behavior normal.  ? ? ?BP 140/76  Pulse 91   Ht 5' 4"  (1.626 m)   Wt 230 lb 12.8 oz (104.7 kg)   BMI 39.62 kg/m?  ?Wt Readings from Last 3 Encounters:  ?10/03/21 230 lb 12.8 oz (104.7 kg)  ?07/31/21 235 lb 8 oz (106.8 kg)  ?05/31/21 232 lb 3.2 oz (105.3 kg)  ? ? ? ?Health Maintenance Due  ?Topic Date Due  ? COVID-19 Vaccine (1) Never done  ? FOOT EXAM  Never done  ? TETANUS/TDAP  Never done  ? Zoster Vaccines- Shingrix (1 of 2) Never done  ? OPHTHALMOLOGY EXAM  06/23/2021  ? HEMOGLOBIN A1C  07/19/2021  ? ? ?There are no preventive care reminders to display for this patient. ? ?Lab Results  ?Component Value Date  ? TSH 0.84 01/16/2021  ? ?Lab Results  ?Component Value Date  ? WBC 4.6 01/16/2021  ? HGB 11.3 (L) 01/16/2021  ? HCT 35.4 01/16/2021  ? MCV 95.9 01/16/2021  ? PLT 205 01/16/2021  ? ?Lab Results  ?Component Value Date  ? NA 140 01/16/2021  ? K 3.8 01/16/2021  ? CO2 24 01/16/2021  ? GLUCOSE 125 (H) 01/16/2021  ? BUN 25 01/16/2021  ? CREATININE 0.90 01/16/2021  ? BILITOT 0.7 01/16/2021  ? ALKPHOS 91 09/29/2019  ? AST 21 01/16/2021  ? ALT 16 01/16/2021  ? PROT 6.9 01/16/2021  ? ALBUMIN 3.8 09/29/2019  ? CALCIUM 10.0 01/16/2021  ? ANIONGAP 12 10/20/2020  ? EGFR 63 01/16/2021  ? ?Lab Results  ?Component Value Date  ? CHOL 176 01/16/2021  ? ?Lab Results  ?Component Value Date  ? HDL 50 01/16/2021  ? ?Lab Results  ?Component Value Date  ? LDLCALC 103 (H) 01/16/2021  ? ?Lab Results  ?Component Value Date  ? TRIG 124 01/16/2021  ? ?Lab Results  ?Component Value Date  ? CHOLHDL 3.5 01/16/2021  ? ?Lab Results  ?Component Value Date  ? HGBA1C 5.8 (H) 01/16/2021  ? ? ?  ?Assessment & Plan:  ? ?Problem  List Items Addressed This Visit   ? ?  ? Cardiovascular and Mediastinum  ? Essential hypertension  ?    ?  patient was advised to follow low-salt low-cholesterol diet ? ?  ideally I want to keep systolic blood pres

## 2021-10-03 NOTE — Assessment & Plan Note (Signed)
Hypercholesterolemia  I advised the patient to follow Mediterranean diet This diet is rich in fruits vegetables and whole grain, and This diet is also rich in fish and lean meat Patient should also eat a handful of almonds or walnuts daily Recent heart study indicated that average follow-up on this kind of diet reduces the cardiovascular mortality by 50 to 70%== 

## 2021-10-03 NOTE — Addendum Note (Signed)
Addended by: Cletis Athens on: 10/03/2021 09:28 AM ? ? Modules accepted: Orders ? ?

## 2021-10-17 ENCOUNTER — Encounter: Payer: Self-pay | Admitting: Internal Medicine

## 2021-10-17 ENCOUNTER — Ambulatory Visit (INDEPENDENT_AMBULATORY_CARE_PROVIDER_SITE_OTHER): Payer: Medicare Other | Admitting: Internal Medicine

## 2021-10-17 VITALS — Ht 64.0 in | Wt 234.9 lb

## 2021-10-17 DIAGNOSIS — Z6841 Body Mass Index (BMI) 40.0 and over, adult: Secondary | ICD-10-CM

## 2021-10-17 DIAGNOSIS — J4520 Mild intermittent asthma, uncomplicated: Secondary | ICD-10-CM

## 2021-10-17 DIAGNOSIS — E119 Type 2 diabetes mellitus without complications: Secondary | ICD-10-CM | POA: Diagnosis not present

## 2021-10-17 DIAGNOSIS — I10 Essential (primary) hypertension: Secondary | ICD-10-CM | POA: Diagnosis not present

## 2021-10-17 DIAGNOSIS — E7800 Pure hypercholesterolemia, unspecified: Secondary | ICD-10-CM | POA: Diagnosis not present

## 2021-10-17 LAB — GLUCOSE, POCT (MANUAL RESULT ENTRY): POC Glucose: 149 mg/dl — AB (ref 70–99)

## 2021-10-17 NOTE — Progress Notes (Signed)
Established Patient Office Visit  Subjective:  Patient ID: Vanessa Vazquez, female    DOB: 02/17/1935  Age: 87 y.o. MRN: [id number]  CC:  Chief Complaint  Patient presents with   Diabetes    Diabetes   [name] presents for check up  Past Medical History:  Diagnosis Date   Asthma    Diabetes mellitus without complication ([place])    GERD (gastroesophageal reflux disease)    Hypercholesteremia    Hypertension     Past Surgical History:  Procedure Laterality Date   CESAREAN SECTION     COLONOSCOPY WITH PROPOFOL N/A 03/19/2016   Procedure: COLONOSCOPY WITH PROPOFOL;  Surgeon: Manya Silvas, MD;  Location: [facility];  Service: Endoscopy;  Laterality: N/A;    Family History  Problem Relation Age of Onset   Lung cancer Father    Leukemia Sister    Diabetes Sister    Asthma Child     Social History   Socioeconomic History   Marital status: Widowed    Spouse name: Not on file   Number of children: Not on file   Years of education: Not on file   Highest education level: GED or equivalent  Occupational History   Not on file  Tobacco Use   Smoking status: Former   Smokeless tobacco: Never  Substance and Sexual Activity   Alcohol use: No    Alcohol/week: 0.0 standard drinks   Drug use: No   Sexual activity: Never  Other Topics Concern   Not on file  Social History Narrative   Not on file   Social Determinants of Health   Financial Resource Strain: Low Risk    Difficulty of Paying Living Expenses: Not hard at all  Food Insecurity: No Food Insecurity   Worried About Charity fundraiser in the Last Year: Never true   Tranquillity in the Last Year: Never true  Transportation Needs: No Transportation Needs   Lack of Transportation (Medical): No   Lack of Transportation (Non-Medical): No  Physical Activity: Insufficiently Active   Days of Exercise per Week: 5 days   Minutes of Exercise per Session: 10 min  Stress: No Stress Concern Present    Feeling of Stress : Not at all  Social Connections: Moderately Isolated   Frequency of Communication with Friends and Family: More than three times a week   Frequency of Social Gatherings with Friends and Family: More than three times a week   Attends Religious Services: More than 4 times per year   Active Member of [employer] or Organizations: No   Attends Archivist Meetings: Never   Marital Status: Widowed  Human resources officer Violence: Not At Risk   Fear of Current or Ex-Partner: No   Emotionally Abused: No   Physically Abused: No   Sexually Abused: No     Current Outpatient Medications:    albuterol (VENTOLIN HFA) 108 (90 Base) MCG/ACT inhaler, Inhale 2 puffs into the lungs every 4 (four) hours as needed for wheezing or shortness of breath., Disp: 18 g, Rfl: 6   atenolol (TENORMIN) 25 MG tablet, TAKE 1 TABLET BY MOUTH ONCE DAILY, Disp: 30 tablet, Rfl: 6   atorvastatin (LIPITOR) 40 MG tablet, Take 1 tablet (40 mg total) by mouth daily., Disp: 90 tablet, Rfl: 3   furosemide (LASIX) 20 MG tablet, TAKE 1 TABLET BY MOUTH EVERY OTHER DAY, Disp: 30 tablet, Rfl: 3   glimepiride (AMARYL) 2 MG tablet, TAKE 1 TABLET BY  MOUTH TWICE DAILY, Disp: 180 tablet, Rfl: 3   JANUVIA 100 MG tablet, TAKE 1 TABLET BY MOUTH ONCE DAILY, Disp: 90 tablet, Rfl: 3   loratadine (CLARITIN) 10 MG tablet, Take 1 tablet (10 mg total) by mouth daily., Disp: 90 tablet, Rfl: 3   losartan-hydrochlorothiazide (HYZAAR) 50-12.5 MG tablet, TAKE 2 TABLETS BY MOUTH ONCE DAILY, Disp: 180 tablet, Rfl: 2   metFORMIN (GLUCOPHAGE) 1000 MG tablet, Take 1 tablet (1,000 mg total) by mouth 2 (two) times daily., Disp: 180 tablet, Rfl: 3   Misc. Devices (WALKER) MISC, 1 each by Does not apply route daily., Disp: 1 each, Rfl: 0   omeprazole (PRILOSEC) 20 MG capsule, Take 1 capsule (20 mg total) by mouth daily., Disp: 30 capsule, Rfl: 3   pregabalin (LYRICA) 50 MG capsule, TAKE 1 CAPSULE BY MOUTH TWICE DAILY, Disp: 60 capsule, Rfl: 6    Respiratory Therapy Supplies (NEBULIZER) DEVI, 1 Device by Does not apply route 2 (two) times daily as needed., Disp: 1 each, Rfl: 0   ferrous sulfate 300 (60 Fe) MG/5ML syrup, Take 5 mLs (300 mg total) by mouth daily., Disp: 150 mL, Rfl: 3   No Known Allergies  ROS Review of Systems  Constitutional: Negative.   HENT: Negative.    Eyes: Negative.   Respiratory: Negative.    Cardiovascular: Negative.   Gastrointestinal: Negative.   Endocrine: Negative.   Genitourinary: Negative.   Musculoskeletal: Negative.   Skin: Negative.   Allergic/Immunologic: Negative.   Neurological: Negative.   Hematological: Negative.   Psychiatric/Behavioral: Negative.    All other systems reviewed and are negative.    Objective:    Physical Exam Vitals reviewed.  Constitutional:      Appearance: Normal appearance.  HENT:     Mouth/Throat:     Mouth: Mucous membranes are moist.  Eyes:     Pupils: Pupils are equal, round, and reactive to light.  Neck:     Vascular: No carotid bruit.  Cardiovascular:     Rate and Rhythm: Normal rate and regular rhythm.     Pulses: Normal pulses.     Heart sounds: Normal heart sounds.  Pulmonary:     Effort: Pulmonary effort is normal.     Breath sounds: Normal breath sounds.  Abdominal:     General: Bowel sounds are normal.     Palpations: Abdomen is soft. There is no hepatomegaly, splenomegaly or mass.     Tenderness: There is no abdominal tenderness.     Hernia: No hernia is present.  Musculoskeletal:        General: No tenderness.     Cervical back: Neck supple.     Right lower leg: No edema.     Left lower leg: No edema.  Skin:    Findings: No rash.  Neurological:     Mental Status: She is alert and oriented to person, place, and time.     Motor: No weakness.  Psychiatric:        Mood and Affect: Mood and affect normal.        Behavior: Behavior normal.    Ht 5' 4"  (1.626 m)   Wt 234 lb 14.4 oz (106.5 kg)   BMI 40.32 kg/m  Wt Readings  from Last 3 Encounters:  10/17/21 234 lb 14.4 oz (106.5 kg)  10/03/21 230 lb 12.8 oz (104.7 kg)  07/31/21 235 lb 8 oz (106.8 kg)     Health Maintenance Due  Topic Date Due   COVID-19 Vaccine (1) Never  done   FOOT EXAM  Never done   TETANUS/TDAP  Never done   Zoster Vaccines- Shingrix (1 of 2) Never done   OPHTHALMOLOGY EXAM  06/23/2021   HEMOGLOBIN A1C  07/19/2021    There are no preventive care reminders to display for this patient.  Lab Results  Component Value Date   TSH 0.84 01/16/2021   Lab Results  Component Value Date   WBC 4.6 01/16/2021   HGB 11.3 (L) 01/16/2021   HCT 35.4 01/16/2021   MCV 95.9 01/16/2021   PLT 205 01/16/2021   Lab Results  Component Value Date   NA 140 01/16/2021   K 3.8 01/16/2021   CO2 24 01/16/2021   GLUCOSE 125 (H) 01/16/2021   BUN 25 01/16/2021   CREATININE 0.90 01/16/2021   BILITOT 0.7 01/16/2021   ALKPHOS 91 09/29/2019   AST 21 01/16/2021   ALT 16 01/16/2021   PROT 6.9 01/16/2021   ALBUMIN 3.8 09/29/2019   CALCIUM 10.0 01/16/2021   ANIONGAP 12 10/20/2020   EGFR 63 01/16/2021   Lab Results  Component Value Date   CHOL 176 01/16/2021   Lab Results  Component Value Date   HDL 50 01/16/2021   Lab Results  Component Value Date   LDLCALC 103 (H) 01/16/2021   Lab Results  Component Value Date   TRIG 124 01/16/2021   Lab Results  Component Value Date   CHOLHDL 3.5 01/16/2021   Lab Results  Component Value Date   HGBA1C 5.8 (H) 01/16/2021      Assessment & Plan:   Problem List Items Addressed This Visit       Cardiovascular and Mediastinum   Essential hypertension     Patient denies any chest pain or shortness of breath there is no history of palpitation or paroxysmal nocturnal dyspnea   patient was advised to follow low-salt low-cholesterol diet    ideally I want to keep systolic blood pressure below 130 mmHg, patient was asked to check blood pressure one times a week and give me a report on that.  Patient  will be follow-up in 3 months  or earlier as needed, patient will call me back for any change in the cardiovascular symptoms Patient was advised to buy a book from local bookstore concerning blood pressure and read several chapters  every day.  This will be supplemented by some of the material we will give him from the office.  Patient should also utilize other resources like YouTube and Internet to learn more about the blood pressure and the diet.         Respiratory   Asthma    Patient was not found to have any wheezing         Endocrine   Type 2 diabetes mellitus without complication, without long-term current use of insulin ([place]) - Primary    - The patient's blood sugar is labile on med. - The patient will continue the current treatment regimen.  - I encouraged the patient to regularly check blood sugar.  - I encouraged the patient to monitor diet. I encouraged the patient to eat low-carb and low-sugar to help prevent blood sugar spikes.  - I encouraged the patient to continue following their prescribed treatment plan for diabetes - I informed the patient to get help if blood sugar drops below 28m/dL, or if suddenly have trouble thinking clearly or breathing.  Patient was advised to buy a book on diabetes from a local bookstore or from [place]  Patient  should read 2 chapters every day to keep the motivation going, this is in addition to some of the materials we provided them from the office.  There are other resources on the Internet like YouTube and wilkipedia to get an education on the diabetes Patient takes her medicine regularly.       Relevant Orders   POCT glucose (manual entry) (Completed)     Other   Hypercholesteremia    Hypercholesterolemia  I advised the patient to follow Mediterranean diet This diet is rich in fruits vegetables and whole grain, and This diet is also rich in fish and lean meat Patient should also eat a handful of almonds or walnuts daily Recent  heart study indicated that average follow-up on this kind of diet reduces the cardiovascular mortality by 50 to 70%==       Class 3 severe obesity due to excess calories with serious comorbidity and body mass index (BMI) of 40.0 to 44.9 in adult [facility])    - I encouraged the patient to lose weight.  - I educated them on making healthy dietary choices including eating more fruits and vegetables and less fried foods. - I encouraged the patient to exercise more, and educated on the benefits of exercise including weight loss, diabetes prevention, and hypertension prevention.   Dietary counseling with a registered dietician  Referral to a weight management support group (e.g. Weight Watchers, Overeaters Anonymous)  If your BMI is greater than 29 or you have gained more than 15 pounds you should work on weight loss.  Attend a healthy cooking class         No orders of the defined types were placed in this encounter.   Follow-up: No follow-ups on file.    Cletis Athens, MD

## 2021-10-17 NOTE — Assessment & Plan Note (Signed)
Hypercholesterolemia  I advised the patient to follow Mediterranean diet This diet is rich in fruits vegetables and whole grain, and This diet is also rich in fish and lean meat Patient should also eat a handful of almonds or walnuts daily Recent heart study indicated that average follow-up on this kind of diet reduces the cardiovascular mortality by 50 to 70%== 

## 2021-10-17 NOTE — Assessment & Plan Note (Signed)

## 2021-10-17 NOTE — Assessment & Plan Note (Signed)

## 2021-10-17 NOTE — Assessment & Plan Note (Signed)
-   The patient's blood sugar is labile on med. - The patient will continue the current treatment regimen.  - I encouraged the patient to regularly check blood sugar.  - I encouraged the patient to monitor diet. I encouraged the patient to eat low-carb and low-sugar to help prevent blood sugar spikes.  - I encouraged the patient to continue following their prescribed treatment plan for diabetes - I informed the patient to get help if blood sugar drops below '54mg'$ /dL, or if suddenly have trouble thinking clearly or breathing.  Patient was advised to buy a book on diabetes from a local bookstore or from [place]  Patient should read 2 chapters every day to keep the motivation going, this is in addition to some of the materials we provided them from the office.  There are other resources on the Internet like YouTube and wilkipedia to get an education on the diabetes Patient takes her medicine regularly.

## 2021-10-17 NOTE — Assessment & Plan Note (Signed)
Patient was not found to have any wheezing 

## 2021-10-19 ENCOUNTER — Other Ambulatory Visit: Payer: Self-pay | Admitting: Internal Medicine

## 2021-10-19 DIAGNOSIS — E119 Type 2 diabetes mellitus without complications: Secondary | ICD-10-CM

## 2021-10-20 DIAGNOSIS — R2681 Unsteadiness on feet: Secondary | ICD-10-CM | POA: Diagnosis not present

## 2021-11-03 ENCOUNTER — Encounter: Payer: Self-pay | Admitting: Podiatry

## 2021-11-03 ENCOUNTER — Ambulatory Visit (INDEPENDENT_AMBULATORY_CARE_PROVIDER_SITE_OTHER): Payer: Medicare Other | Admitting: Podiatry

## 2021-11-03 DIAGNOSIS — B351 Tinea unguium: Secondary | ICD-10-CM | POA: Diagnosis not present

## 2021-11-03 DIAGNOSIS — M79674 Pain in right toe(s): Secondary | ICD-10-CM | POA: Diagnosis not present

## 2021-11-03 DIAGNOSIS — M79675 Pain in left toe(s): Secondary | ICD-10-CM | POA: Diagnosis not present

## 2021-11-03 DIAGNOSIS — E0843 Diabetes mellitus due to underlying condition with diabetic autonomic (poly)neuropathy: Secondary | ICD-10-CM

## 2021-11-03 NOTE — Progress Notes (Signed)
   SUBJECTIVE Patient with a history of diabetes mellitus presents to office today complaining of elongated, thickened nails that cause pain while ambulating in shoes.  Patient is unable to trim their own nails. Patient is here for further evaluation and treatment.   Past Medical History:  Diagnosis Date   Asthma    Diabetes mellitus without complication ([place])    GERD (gastroesophageal reflux disease)    Hypercholesteremia    Hypertension     OBJECTIVE General Patient is awake, alert, and oriented x 3 and in no acute distress. Derm Skin is dry and supple bilateral. Negative open lesions or macerations. Remaining integument unremarkable. Nails are tender, long, thickened and dystrophic with subungual debris, consistent with onychomycosis, 1-5 bilateral. No signs of infection noted. Vasc  DP and PT pedal pulses palpable bilaterally. Temperature gradient within normal limits.  Neuro Epicritic and protective threshold sensation diminished bilaterally.  Musculoskeletal Exam No symptomatic pedal deformities noted bilateral. Muscular strength within normal limits.  ASSESSMENT 1. Diabetes Mellitus w/ peripheral neuropathy 2.  Pain due to onychomycosis of toenails bilateral  PLAN OF CARE 1. Patient evaluated today. 2. Instructed to maintain good pedal hygiene and foot care. Stressed importance of controlling blood sugar.  3. Mechanical debridement of nails 1-5 bilaterally performed using a nail nipper. Filed with dremel without incident.  4. Return to clinic in 3 mos.     Edrick Kins, DPM [facility]  Dr. Edrick Kins, DPM    [address], [place] [address]                Office [phone]  Fax [phone]

## 2021-11-14 ENCOUNTER — Other Ambulatory Visit: Payer: Self-pay | Admitting: Internal Medicine

## 2021-11-15 DIAGNOSIS — E119 Type 2 diabetes mellitus without complications: Secondary | ICD-10-CM | POA: Diagnosis not present

## 2021-11-15 DIAGNOSIS — H26492 Other secondary cataract, left eye: Secondary | ICD-10-CM | POA: Diagnosis not present

## 2021-11-15 LAB — HM DIABETES EYE EXAM

## 2021-11-21 ENCOUNTER — Other Ambulatory Visit: Payer: Self-pay

## 2021-11-21 ENCOUNTER — Emergency Department
Admission: EM | Admit: 2021-11-21 | Discharge: 2021-11-21 | Disposition: A | Discharge status: Home / Self Care | Payer: Medicare Other | Attending: Emergency Medicine | Admitting: Emergency Medicine

## 2021-11-21 ENCOUNTER — Encounter: Payer: Self-pay | Admitting: Emergency Medicine

## 2021-11-21 ENCOUNTER — Emergency Department: Payer: Medicare Other

## 2021-11-21 DIAGNOSIS — K862 Cyst of pancreas: Secondary | ICD-10-CM | POA: Diagnosis not present

## 2021-11-21 DIAGNOSIS — K449 Diaphragmatic hernia without obstruction or gangrene: Secondary | ICD-10-CM | POA: Diagnosis not present

## 2021-11-21 DIAGNOSIS — R42 Dizziness and giddiness: Secondary | ICD-10-CM | POA: Insufficient documentation

## 2021-11-21 DIAGNOSIS — N3000 Acute cystitis without hematuria: Secondary | ICD-10-CM | POA: Diagnosis not present

## 2021-11-21 DIAGNOSIS — N281 Cyst of kidney, acquired: Secondary | ICD-10-CM | POA: Diagnosis not present

## 2021-11-21 DIAGNOSIS — E119 Type 2 diabetes mellitus without complications: Secondary | ICD-10-CM | POA: Diagnosis not present

## 2021-11-21 DIAGNOSIS — R531 Weakness: Secondary | ICD-10-CM | POA: Insufficient documentation

## 2021-11-21 DIAGNOSIS — I10 Essential (primary) hypertension: Secondary | ICD-10-CM | POA: Diagnosis not present

## 2021-11-21 LAB — URINALYSIS, ROUTINE W REFLEX MICROSCOPIC
Bilirubin Urine: NEGATIVE
Glucose, UA: NEGATIVE mg/dL
Hgb urine dipstick: NEGATIVE
Ketones, ur: NEGATIVE mg/dL
Nitrite: NEGATIVE
Protein, ur: NEGATIVE mg/dL
Specific Gravity, Urine: 1.01 (ref 1.005–1.030)
pH: 5 (ref 5.0–8.0)

## 2021-11-21 LAB — CBC WITH DIFFERENTIAL/PLATELET
Abs Immature Granulocytes: 0.02 10*3/uL (ref 0.00–0.07)
Basophils Absolute: 0.1 10*3/uL (ref 0.0–0.1)
Basophils Relative: 1 %
Eosinophils Absolute: 0.1 10*3/uL (ref 0.0–0.5)
Eosinophils Relative: 3 %
HCT: 35.9 % — ABNORMAL LOW (ref 36.0–46.0)
Hemoglobin: 11.4 g/dL — ABNORMAL LOW (ref 12.0–15.0)
Immature Granulocytes: 0 %
Lymphocytes Relative: 30 %
Lymphs Abs: 1.5 10*3/uL (ref 0.7–4.0)
MCH: 31.1 pg (ref 26.0–34.0)
MCHC: 31.8 g/dL (ref 30.0–36.0)
MCV: 97.8 fL (ref 80.0–100.0)
Monocytes Absolute: 0.4 10*3/uL (ref 0.1–1.0)
Monocytes Relative: 8 %
Neutro Abs: 2.9 10*3/uL (ref 1.7–7.7)
Neutrophils Relative %: 58 %
Platelets: 200 10*3/uL (ref 150–400)
RBC: 3.67 MIL/uL — ABNORMAL LOW (ref 3.87–5.11)
RDW: 12.1 % (ref 11.5–15.5)
WBC: 5 10*3/uL (ref 4.0–10.5)
nRBC: 0 % (ref 0.0–0.2)

## 2021-11-21 LAB — COMPREHENSIVE METABOLIC PANEL
ALT: 13 U/L (ref 0–44)
AST: 22 U/L (ref 15–41)
Albumin: 3.4 g/dL — ABNORMAL LOW (ref 3.5–5.0)
Alkaline Phosphatase: 66 U/L (ref 38–126)
Anion gap: 6 (ref 5–15)
BUN: 26 mg/dL — ABNORMAL HIGH (ref 8–23)
CO2: 25 mmol/L (ref 22–32)
Calcium: 9.7 mg/dL (ref 8.9–10.3)
Chloride: 109 mmol/L (ref 98–111)
Creatinine, Ser: 1.03 mg/dL — ABNORMAL HIGH (ref 0.44–1.00)
GFR, Estimated: 54 mL/min — ABNORMAL LOW (ref >60)
Glucose, Bld: 163 mg/dL — ABNORMAL HIGH (ref 70–99)
Potassium: 4 mmol/L (ref 3.5–5.1)
Sodium: 140 mmol/L (ref 135–145)
Total Bilirubin: 0.6 mg/dL (ref 0.3–1.2)
Total Protein: 7.1 g/dL (ref 6.5–8.1)

## 2021-11-21 LAB — TROPONIN I (HIGH SENSITIVITY)
Troponin I (High Sensitivity): 7 ng/L (ref <18)
Troponin I (High Sensitivity): 6 ng/L (ref <18)

## 2021-11-21 MED ORDER — LACTATED RINGERS IV BOLUS
1000.0000 mL | Freq: Once | INTRAVENOUS | Status: AC
  Administered 2021-11-21: 1000 mL via INTRAVENOUS

## 2021-11-21 MED ORDER — CEPHALEXIN 500 MG PO CAPS: 500.0000 mg | ORAL_CAPSULE | Freq: Three times a day (TID) | ORAL | 0 refills | Status: AC

## 2021-11-21 MED ORDER — IOHEXOL 300 MG/ML  SOLN
100.0000 mL | Freq: Once | INTRAMUSCULAR | Status: AC | PRN
  Administered 2021-11-21: 100 mL via INTRAVENOUS

## 2021-11-21 MED ORDER — SODIUM CHLORIDE 0.9 % IV SOLN
1.0000 g | Freq: Once | INTRAVENOUS | Status: AC
  Administered 2021-11-21: 1 g via INTRAVENOUS
  Filled 2021-11-21: qty 10

## 2021-11-21 NOTE — ED Notes (Signed)
Pt taken to ct 

## 2021-11-21 NOTE — ED Provider Notes (Signed)
[facility] Provider Note    Event Date/Time   First MD Initiated Contact with Patient 11/21/21 0901     (approximate)   History   Dizziness   HPI  Vanessa Vazquez is a 87 y.o. female who presents to the ED for evaluation of Dizziness   I reviewed PCP clinic visit from 5/23.  Obese patient with history of HTN, HLD, DM and GERD.  Patient presents to the ED evaluation of an episode of presyncopal dizziness this morning when getting out of bed.  She reports for couple days now she has felt maybe 3-4 episodes of presyncopal dizziness, often with standing, as well as a vague sensation of generalized weakness.  She reports having a few episodes of watery diarrhea on Sunday, 2 days ago.  Symptoms seemed to start after this.  Denies any abdominal pain, emesis, fever, chest pain, headache, shortness of breath, dysuria, focal weakness, falls or injuries, syncopal episodes.  Reports feeling okay right now.  She reports that she felt dizziness and presyncope this morning with standing such that she wanted to get checked out.  Physical Exam   Triage Vital Signs: ED Triage Vitals  Enc Vitals Group     BP 11/21/21 0841 (!) 145/69     Pulse Rate 11/21/21 0841 80     Resp 11/21/21 0841 16     Temp 11/21/21 0841 98.8 F (37.1 C)     Temp Source 11/21/21 0841 Oral     SpO2 11/21/21 0841 94 %     Weight 11/21/21 0836 234 lb 12.6 oz (106.5 kg)     Height 11/21/21 0836 5\' 4"  (1.626 m)     Head Circumference --      Peak Flow --      Pain Score 11/21/21 0836 0     Pain Loc --      Pain Edu? --      Excl. in GC? --     Most recent vital signs: Vitals:   11/21/21 0930 11/21/21 1100  BP: (!) 147/79 (!) 136/57  Pulse: 61 68  Resp: 15 17  Temp:    SpO2: 99% 99%    General: Awake, no distress.  Morbidly obese.  Pleasant and conversational.  Well-appearing. CV:  Good peripheral perfusion. RRR Resp:  Normal effort. CTAB Abd:  No distention.  Soft and  benign MSK:  No deformity noted.  Neuro:  No focal deficits appreciated. Cranial nerves II through XII intact 5/5 strength and sensation in all 4 extremities Other:     ED Results / Procedures / Treatments   Labs (all labs ordered are listed, but only abnormal results are displayed) Labs Reviewed  COMPREHENSIVE METABOLIC PANEL - Abnormal; Notable for the following components:      Result Value   Glucose, Bld 163 (*)    BUN 26 (*)    Creatinine, Ser 1.03 (*)    Albumin 3.4 (*)    GFR, Estimated 54 (*)    All other components within normal limits  CBC WITH DIFFERENTIAL/PLATELET - Abnormal; Notable for the following components:   RBC 3.67 (*)    Hemoglobin 11.4 (*)    HCT 35.9 (*)    All other components within normal limits  URINALYSIS, ROUTINE W REFLEX MICROSCOPIC - Abnormal; Notable for the following components:   Color, Urine YELLOW (*)    APPearance HAZY (*)    Leukocytes,Ua LARGE (*)    Bacteria, UA FEW (*)    Non Squamous  Epithelial PRESENT (*)    All other components within normal limits  URINE CULTURE  TROPONIN I (HIGH SENSITIVITY)  TROPONIN I (HIGH SENSITIVITY)    EKG Sinus rhythm at a rate of 76 bpm.  Normal axis.  Left bundle morphology.  No clear signs of acute ischemia.  RADIOLOGY CT abd/pelvis interpreted by me with slightly thickened bladder wall, but no evidence of ureteral obstruction, pyelonephritis or SBO  Official radiology report(s): CT ABDOMEN PELVIS W CONTRAST  Result Date: 11/21/2021 CLINICAL DATA:  Weakness. EXAM: CT ABDOMEN AND PELVIS WITH CONTRAST TECHNIQUE: Multidetector CT imaging of the abdomen and pelvis was performed using the standard protocol following bolus administration of intravenous contrast. RADIATION DOSE REDUCTION: This exam was performed according to the departmental dose-optimization program which includes automated exposure control, adjustment of the mA and/or kV according to patient size and/or use of iterative reconstruction  technique. CONTRAST:  OMNIPAQUE IOHEXOL 300 MG/ML  SOLN COMPARISON:  Abdominal ultrasound dated November 10, 2015. FINDINGS: Lower chest: No acute abnormality. Hepatobiliary: No focal liver abnormality is seen. No gallstones, gallbladder wall thickening, or biliary dilatation. Pancreas: 1.8 cm cystic lesion in the posterior pancreatic head (series 2, image 28). No ductal dilatation or surrounding inflammatory changes. Spleen: Normal in size without focal abnormality. Adrenals/Urinary Tract: The adrenal glands and left kidney are unremarkable. 1.6 cm simple cyst in the upper pole of the right kidney. No follow-up imaging is recommended. No renal calculi or hydronephrosis. Mild-to-moderate circumferential bladder wall thickening. Stomach/Bowel: Small hiatal hernia. The stomach is otherwise within normal limits. No bowel wall thickening, distention, or surrounding inflammatory changes. Redundant colon. Normal appendix. Vascular/Lymphatic: Aortic atherosclerosis. No enlarged abdominal or pelvic lymph nodes. Reproductive: Probable uterine fibroid.  No adnexal mass. Other: Small fat containing umbilical hernia. No free fluid or pneumoperitoneum. Musculoskeletal: No acute or significant osseous findings. IMPRESSION: 1. No acute intra-abdominal process. 2. Mild-to-moderate circumferential bladder wall thickening may in part be related to underdistention. Correlate with urinalysis to exclude cystitis. 3. 1.8 cm cystic lesion in the posterior pancreatic head. Recommend follow-up pancreatic protocol CT or MRI with and without contrast in 2 years. This recommendation follows ACR consensus guidelines: Management of Incidental Pancreatic Cysts: A White Paper of the ACR Incidental Findings Committee. J Am Coll Radiol 2017;14:911-923. 4. Aortic Atherosclerosis (ICD10-I70.0). Electronically Signed   By: Obie Dredge M.D.   On: 11/21/2021 11:06   CT HEAD WO CONTRAST ( )  Result Date: 11/21/2021 CLINICAL DATA:  Dizziness,  lightheadedness, weakness, awoke this morning feeling of swelling, feels like RIGHT-side is different EXAM: CT HEAD WITHOUT CONTRAST TECHNIQUE: Contiguous axial images were obtained from the base of the skull through the vertex without intravenous contrast. RADIATION DOSE REDUCTION: This exam was performed according to the departmental dose-optimization program which includes automated exposure control, adjustment of the mA and/or kV according to patient size and/or use of iterative reconstruction technique. COMPARISON:  10/02/2020 FINDINGS: Brain: Normal ventricular morphology. No midline shift or mass effect. Minimal small vessel chronic ischemic changes of deep cerebral white matter. Otherwise normal appearance of brain parenchyma. No intracranial hemorrhage, mass lesion or evidence of acute infarction. No extra-axial fluid collections. Vascular: No hyperdense vessels Skull: Intact Sinuses/Orbits: Clear Other: N/A IMPRESSION: Atrophy with minimal small vessel chronic ischemic changes of deep cerebral white matter. No acute intracranial abnormalities. Electronically Signed   By: Ulyses Southward M.D.   On: 11/21/2021 11:01    PROCEDURES and INTERVENTIONS:  .1-3 Lead EKG Interpretation  Performed by: Delton Prairie, MD Authorized by: Katrinka Blazing,  Domingo Dimes, MD     Interpretation: normal     ECG rate:  70   ECG rate assessment: normal     Rhythm: sinus rhythm     Ectopy: none     Conduction: normal     Medications  lactated ringers bolus 1,000 mL (0 mLs Intravenous Stopped 11/21/21 1037)  iohexol (OMNIPAQUE) 300 MG/ML solution 100 mL (100 mLs Intravenous Contrast Given 11/21/21 1036)  cefTRIAXone (ROCEPHIN) 1 g in sodium chloride 0.9 % 100 mL IVPB (1 g Intravenous New Bag/Given 11/21/21 1137)     IMPRESSION / MDM / ASSESSMENT AND PLAN / ED COURSE  I reviewed the triage vital signs and the nursing notes.  Differential diagnosis includes, but is not limited to, electrolyte derangement, AKI, dehydration, small  bowel obstruction, stroke, cystitis  {Patient presents with symptoms of an acute illness or injury that is potentially life-threatening.  Diabetic 87 year old woman presents to the ED with a couple days of generalized weakness and positional presyncope, possibly due to relative dehydration in the setting of acute cystitis, suitable for trial of outpatient management.  She looks well without signs of neurologic or vascular deficits.  No signs of trauma.  Blood work is generally reassuring with CKD around baseline, no leukocytosis or signs of sepsis.  Urine with some infectious features in the setting of her frequent incontinence, and CT abdomen/pelvis with some bladder wall thickening further suggestive of the possibility of cystitis.  We send her urine for culture and empirically start her on antibiotics.  She feels better after IV fluids and after shared decision making we discussed outpatient management.  I considered observation admission for this patient.  Return precautions discussed.  Clinical Course as of 11/21/21 1229  Tue Nov 21, 2021  1124 Reassessed.  Patient reports feeling better.  We discussed the possibility of acute cystitis causing her symptoms.  She admits to having a lot of urinary leakage [DS]  1229 Reassessed.  Patient reports feeling well.  Her daughter is at the bedside and reports that she looks better and is comfortable with her going home.  We discussed antibiotics at home and return precautions for the ED. [DS]    Clinical Course User Index [DS] Delton Prairie, MD     FINAL CLINICAL IMPRESSION(S) / ED DIAGNOSES   Final diagnoses:  Acute cystitis without hematuria  Dizziness  Generalized weakness     Rx / DC Orders   ED Discharge Orders          Ordered    cephALEXin (KEFLEX) 500 MG capsule  3 times daily        11/21/21 1225             Note:  This document was prepared using Dragon voice recognition software and may include unintentional dictation  errors.   Delton Prairie, MD 11/21/21 [phone]

## 2021-11-22 LAB — URINE CULTURE

## 2021-12-20 ENCOUNTER — Encounter: Payer: Self-pay | Admitting: *Deleted

## 2022-01-01 ENCOUNTER — Emergency Department: Payer: Medicare Other

## 2022-01-01 ENCOUNTER — Other Ambulatory Visit: Payer: Self-pay

## 2022-01-01 ENCOUNTER — Emergency Department
Admission: EM | Admit: 2022-01-01 | Discharge: 2022-01-01 | Disposition: A | Discharge status: Home / Self Care | Payer: Medicare Other | Attending: Student in an Organized Health Care Education/Training Program | Admitting: Student in an Organized Health Care Education/Training Program

## 2022-01-01 DIAGNOSIS — E119 Type 2 diabetes mellitus without complications: Secondary | ICD-10-CM | POA: Insufficient documentation

## 2022-01-01 DIAGNOSIS — R5383 Other fatigue: Secondary | ICD-10-CM | POA: Diagnosis present

## 2022-01-01 DIAGNOSIS — J45909 Unspecified asthma, uncomplicated: Secondary | ICD-10-CM | POA: Diagnosis not present

## 2022-01-01 DIAGNOSIS — R079 Chest pain, unspecified: Secondary | ICD-10-CM | POA: Diagnosis not present

## 2022-01-01 DIAGNOSIS — R0602 Shortness of breath: Secondary | ICD-10-CM | POA: Diagnosis not present

## 2022-01-01 DIAGNOSIS — R531 Weakness: Secondary | ICD-10-CM | POA: Diagnosis not present

## 2022-01-01 DIAGNOSIS — R062 Wheezing: Secondary | ICD-10-CM | POA: Diagnosis not present

## 2022-01-01 LAB — CBC
HCT: 37.5 % (ref 36.0–46.0)
Hemoglobin: 11.9 g/dL — ABNORMAL LOW (ref 12.0–15.0)
MCH: 31 pg (ref 26.0–34.0)
MCHC: 31.7 g/dL (ref 30.0–36.0)
MCV: 97.7 fL (ref 80.0–100.0)
Platelets: 202 10*3/uL (ref 150–400)
RBC: 3.84 MIL/uL — ABNORMAL LOW (ref 3.87–5.11)
RDW: 12 % (ref 11.5–15.5)
WBC: 5.5 10*3/uL (ref 4.0–10.5)
nRBC: 0 % (ref 0.0–0.2)

## 2022-01-01 LAB — BASIC METABOLIC PANEL
Anion gap: 7 (ref 5–15)
BUN: 22 mg/dL (ref 8–23)
CO2: 25 mmol/L (ref 22–32)
Calcium: 9.8 mg/dL (ref 8.9–10.3)
Chloride: 106 mmol/L (ref 98–111)
Creatinine, Ser: 0.94 mg/dL (ref 0.44–1.00)
GFR, Estimated: 60 mL/min — ABNORMAL LOW (ref >60)
Glucose, Bld: 180 mg/dL — ABNORMAL HIGH (ref 70–99)
Potassium: 3.5 mmol/L (ref 3.5–5.1)
Sodium: 138 mmol/L (ref 135–145)

## 2022-01-01 LAB — TROPONIN I (HIGH SENSITIVITY)
Troponin I (High Sensitivity): 6 ng/L (ref <18)
Troponin I (High Sensitivity): 6 ng/L (ref <18)

## 2022-01-01 LAB — BRAIN NATRIURETIC PEPTIDE: B Natriuretic Peptide: 35.6 pg/mL (ref 0.0–100.0)

## 2022-01-01 MED ORDER — IPRATROPIUM-ALBUTEROL 0.5-2.5 (3) MG/3ML IN SOLN
3.0000 mL | Freq: Once | RESPIRATORY_TRACT | Status: AC
  Administered 2022-01-01: 3 mL via RESPIRATORY_TRACT
  Filled 2022-01-01: qty 3

## 2022-01-01 MED ORDER — ALBUTEROL SULFATE HFA 108 (90 BASE) MCG/ACT IN AERS
2.0000 | INHALATION_SPRAY | RESPIRATORY_TRACT | 6 refills | Status: DC | PRN
Start: 2022-01-01 — End: 2023-07-08

## 2022-01-01 NOTE — ED Notes (Signed)
Pt knows need for ua . 

## 2022-01-01 NOTE — ED Triage Notes (Signed)
Pt to ED via POV from home. Pt reports upon waking she woke up feeling anxious and dizzy. Pt endorses SOB and CP. CBG at home 91. Pt denies cough, congestion, N/V/D. Pt denies blood thinner.

## 2022-01-01 NOTE — ED Provider Notes (Signed)
[facility] Provider Note    Event Date/Time   First MD Initiated Contact with Patient 01/01/22 1151     (approximate)   History   Weakness and Chest Pain   HPI  Vanessa Vazquez is a 87 y.o. female   history of diabetes mild history of asthma presents to the ER for generalized malaise and fatigue for the past several days.  States that she woke up this morning feeling she was having some wheezing.  Denies any orthopnea.  No nausea or vomiting no chest pain or pressure.  States that she has been feeling weak and rundown for several weeks.  Has not been having any cough or measured fever chills no dysuria.      Physical Exam   Triage Vital Signs: ED Triage Vitals  Enc Vitals Group     BP 01/01/22 1120 134/84     Pulse Rate 01/01/22 1120 61     Resp 01/01/22 1120 18     Temp 01/01/22 1120 98.6 F (37 C)     Temp Source 01/01/22 1120 Oral     SpO2 01/01/22 1120 97 %     Weight --      Height --      Head Circumference --      Peak Flow --      Pain Score 01/01/22 1121 7     Pain Loc --      Pain Edu? --      Excl. in [place]? --     Most recent vital signs: Vitals:   01/01/22 1300 01/01/22 1330  BP: 126/63 (!) 130/58  Pulse: (!) 57 (!) 56  Resp:    Temp:    SpO2: 93% 96%     Constitutional: Alert  Eyes: Conjunctivae are normal.  Head: Atraumatic. Nose: No congestion/rhinnorhea. Mouth/Throat: Mucous membranes are moist.   Neck: Painless ROM.  Cardiovascular:   Good peripheral circulation. No m/g/r Respiratory: Normal respiratory effort.  No retractions.  Gastrointestinal: Soft and nontender.  Musculoskeletal:  no deformity, 2+ BLE Neurologic:  MAE spontaneously. No gross focal neurologic deficits are appreciated.  Skin:  Skin is warm, dry and intact. No rash noted. Psychiatric: Mood and affect are normal. Speech and behavior are normal.    ED Results / Procedures / Treatments   Labs (all labs ordered are listed, but only abnormal  results are displayed) Labs Reviewed  BASIC METABOLIC PANEL - Abnormal; Notable for the following components:      Result Value   Glucose, Bld 180 (*)    GFR, Estimated 60 (*)    All other components within normal limits  CBC - Abnormal; Notable for the following components:   RBC 3.84 (*)    Hemoglobin 11.9 (*)    All other components within normal limits  BRAIN NATRIURETIC PEPTIDE  URINALYSIS, ROUTINE W REFLEX MICROSCOPIC  TROPONIN I (HIGH SENSITIVITY)  TROPONIN I (HIGH SENSITIVITY)     EKG  ED ECG REPORT I, Merlyn Lot, the attending physician, personally viewed and interpreted this ECG.   Date: 01/01/2022  EKG Time: 11:24  Rate: 65  Rhythm: sinus  Axis: left  Intervals:lbbb  ST&T Change: no stemi, no depressions    RADIOLOGY Please see ED Course for my review and interpretation.  I personally reviewed all radiographic images ordered to evaluate for the above acute complaints and reviewed radiology reports and findings.  These findings were personally discussed with the patient.  Please see medical record for radiology  report.    PROCEDURES:  Critical Care performed: No  Procedures   MEDICATIONS ORDERED IN ED: Medications  ipratropium-albuterol (DUONEB) 0.5-2.5 (3) MG/3ML nebulizer solution 3 mL (3 mLs Nebulization Given 01/01/22 1238)     IMPRESSION / MDM / ASSESSMENT AND PLAN / ED COURSE  I reviewed the triage vital signs and the nursing notes.                              Differential diagnosis includes, but is not limited to, copd, asthma, chf, acs, dehydration, electrolyte abn, pna  Presented to the ER for evaluation of symptoms as described above.  This presenting complaint could reflect a potentially life-threatening illness therefore the patient will be placed on continuous pulse oximetry and telemetry for monitoring.  Laboratory evaluation will be sent to evaluate for the above complaints.    Clinical Course as of 01/01/22 [place] Jan 01, 2022  1335 Patient reassessed.  She feels improved after nebulizer.  Do suspect mild component of bronchitis underlying asthma.  Recommended prednisone patient declining steroids due to adverse reactions in the past.  She is not hypoxic.  Will observe given her age and risk factors we will repeat troponin but is onside is normal I think this to be appropriate for outpatient follow-up.  Does not seem clinically consistent with PE.  No signs of infection. [PR]  1435 Repeat troponin negative.  BNP normal.  At this point does appear stable and appropriate for outpatient follow-up.  Patient agreeable plan. [PR]    Clinical Course User Index [PR] Merlyn Lot, MD    FINAL CLINICAL IMPRESSION(S) / ED DIAGNOSES   Final diagnoses:  Weakness  Wheeze     Rx / DC Orders   ED Discharge Orders          Ordered    albuterol (VENTOLIN HFA) 108 (90 Base) MCG/ACT inhaler  Every 4 hours PRN        01/01/22 1435             Note:  This document was prepared using Dragon voice recognition software and may include unintentional dictation errors.    Merlyn Lot, MD 01/01/22 1436

## 2022-01-02 ENCOUNTER — Other Ambulatory Visit: Payer: Self-pay | Admitting: Internal Medicine

## 2022-01-02 DIAGNOSIS — R079 Chest pain, unspecified: Secondary | ICD-10-CM

## 2022-01-16 ENCOUNTER — Encounter: Payer: Self-pay | Admitting: Internal Medicine

## 2022-01-16 ENCOUNTER — Ambulatory Visit (INDEPENDENT_AMBULATORY_CARE_PROVIDER_SITE_OTHER): Payer: Medicare Other | Admitting: Internal Medicine

## 2022-01-16 VITALS — BP 128/66 | HR 66 | Ht 64.0 in | Wt 232.5 lb

## 2022-01-16 DIAGNOSIS — E119 Type 2 diabetes mellitus without complications: Secondary | ICD-10-CM

## 2022-01-16 DIAGNOSIS — M1711 Unilateral primary osteoarthritis, right knee: Secondary | ICD-10-CM

## 2022-01-16 DIAGNOSIS — J4520 Mild intermittent asthma, uncomplicated: Secondary | ICD-10-CM

## 2022-01-16 DIAGNOSIS — I10 Essential (primary) hypertension: Secondary | ICD-10-CM

## 2022-01-16 LAB — POCT GLYCOSYLATED HEMOGLOBIN (HGB A1C): HbA1c POC (<> result, manual entry): 4.7 % (ref 4.0–5.6)

## 2022-01-16 LAB — GLUCOSE, POCT (MANUAL RESULT ENTRY): POC Glucose: 142 mg/dl — AB (ref 70–99)

## 2022-01-16 NOTE — Assessment & Plan Note (Signed)
Right knee was injected with Kenalog shot 40 mg

## 2022-01-16 NOTE — Assessment & Plan Note (Signed)

## 2022-01-16 NOTE — Assessment & Plan Note (Signed)

## 2022-01-16 NOTE — Progress Notes (Signed)
Established Patient Office Visit  Subjective:  Patient ID: Vanessa Vazquez, female    DOB: [birth year]  Age: 87 y.o. MRN: [id number]  CC:  Chief Complaint  Patient presents with   Diabetes   Knee Pain    Patient having bilateral knee pain that has been persistent for the last few months. Patient states the right knee is a little worse. Patient wants to get steroid injection in right knee today.     Diabetes Pertinent negatives for hypoglycemia include no speech difficulty.  Knee Pain     Vanessa Vazquez presents for rt knee pain  Past Medical History:  Diagnosis Date   Asthma    Diabetes mellitus without complication ([place])    GERD (gastroesophageal reflux disease)    Hypercholesteremia    Hypertension     Past Surgical History:  Procedure Laterality Date   CESAREAN SECTION     COLONOSCOPY WITH PROPOFOL N/A 03/19/2016   Procedure: COLONOSCOPY WITH PROPOFOL;  Surgeon: Manya Silvas, MD;  Location: [facility];  Service: Endoscopy;  Laterality: N/A;    Family History  Problem Relation Age of Onset   Lung cancer Father    Leukemia Sister    Diabetes Sister    Asthma Child     Social History   Socioeconomic History   Marital status: Widowed    Spouse name: Not on file   Number of children: Not on file   Years of education: Not on file   Highest education level: GED or equivalent  Occupational History   Not on file  Tobacco Use   Smoking status: Former   Smokeless tobacco: Never  Substance and Sexual Activity   Alcohol use: No    Alcohol/week: 0.0 standard drinks of alcohol   Drug use: No   Sexual activity: Never  Other Topics Concern   Not on file  Social History Narrative   Not on file   Social Determinants of Health   Financial Resource Strain: Low Risk  (05/11/2021)   Overall Financial Resource Strain (CARDIA)    Difficulty of Paying Living Expenses: Not hard at all  Food Insecurity: No Food Insecurity (05/11/2021)   Hunger Vital Sign     Worried About Running Out of Food in the Last Year: Never true    [place] in the Last Year: Never true  Transportation Needs: No Transportation Needs (05/11/2021)   PRAPARE - Hydrologist (Medical): No    Lack of Transportation (Non-Medical): No  Physical Activity: Insufficiently Active (05/11/2021)   Exercise Vital Sign    Days of Exercise per Week: 5 days    Minutes of Exercise per Session: 10 min  Stress: No Stress Concern Present (05/11/2021)   [place]    Feeling of Stress : Not at all  Social Connections: Moderately Isolated (05/11/2021)   Social Connection and Isolation Panel [NHANES]    Frequency of Communication with Friends and Family: More than three times a week    Frequency of Social Gatherings with Friends and Family: More than three times a week    Attends Religious Services: More than 4 times per year    Active Member of [employer] or Organizations: No    Attends Archivist Meetings: Never    Marital Status: Widowed  Intimate Partner Violence: Not At Risk (05/11/2021)   Humiliation, Afraid, Rape, and Kick questionnaire    Fear of Current or Ex-Partner:  No    Emotionally Abused: No    Physically Abused: No    Sexually Abused: No     Current Outpatient Medications:    albuterol (VENTOLIN HFA) 108 (90 Base) MCG/ACT inhaler, Inhale 2 puffs into the lungs every 4 (four) hours as needed for wheezing or shortness of breath., Disp: 18 g, Rfl: 6   atenolol (TENORMIN) 25 MG tablet, TAKE 1 TABLET BY MOUTH ONCE DAILY, Disp: 30 tablet, Rfl: 6   atorvastatin (LIPITOR) 40 MG tablet, Take 1 tablet (40 mg total) by mouth daily., Disp: 90 tablet, Rfl: 3   furosemide (LASIX) 20 MG tablet, TAKE 1 TABLET BY MOUTH EVERY OTHER DAY, Disp: 30 tablet, Rfl: 3   glimepiride (AMARYL) 2 MG tablet, TAKE 1 TABLET BY MOUTH TWICE DAILY, Disp: 180 tablet, Rfl: 3   JANUVIA 100 MG tablet, TAKE  1 TABLET BY MOUTH ONCE DAILY, Disp: 90 tablet, Rfl: 3   loratadine (CLARITIN) 10 MG tablet, Take 1 tablet (10 mg total) by mouth daily., Disp: 90 tablet, Rfl: 3   losartan-hydrochlorothiazide (HYZAAR) 50-12.5 MG tablet, TAKE 2 TABLETS BY MOUTH ONCE DAILY, Disp: 180 tablet, Rfl: 2   metFORMIN (GLUCOPHAGE) 1000 MG tablet, Take 1 tablet (1,000 mg total) by mouth 2 (two) times daily., Disp: 180 tablet, Rfl: 3   Misc. Devices (WALKER) MISC, 1 each by Does not apply route daily., Disp: 1 each, Rfl: 0   omeprazole (PRILOSEC) 20 MG capsule, TAKE 1 CAPSULE BY MOUTH ONCE DAILY, Disp: 30 capsule, Rfl: 3   pregabalin (LYRICA) 50 MG capsule, TAKE 1 CAPSULE BY MOUTH TWICE DAILY, Disp: 60 capsule, Rfl: 4   Respiratory Therapy Supplies (NEBULIZER) DEVI, 1 Device by Does not apply route 2 (two) times daily as needed., Disp: 1 each, Rfl: 0   ferrous sulfate 300 (60 Fe) MG/5ML syrup, Take 5 mLs (300 mg total) by mouth daily., Disp: 150 mL, Rfl: 3   No Known Allergies  ROS Review of Systems  Constitutional: Negative.   HENT: Negative.    Eyes: Negative.   Respiratory: Negative.    Cardiovascular: Negative.   Gastrointestinal: Negative.   Endocrine: Negative.   Genitourinary: Negative.   Musculoskeletal: Negative.        Rt knee pain  Skin: Negative.   Allergic/Immunologic: Negative.   Neurological: Negative.  Negative for speech difficulty.  Hematological: Negative.   Psychiatric/Behavioral: Negative.    All other systems reviewed and are negative.     Objective:    Physical Exam Vitals reviewed.  Constitutional:      Appearance: Normal appearance.  HENT:     Mouth/Throat:     Mouth: Mucous membranes are moist.  Eyes:     Pupils: Pupils are equal, round, and reactive to light.  Neck:     Vascular: No carotid bruit.  Cardiovascular:     Rate and Rhythm: Normal rate and regular rhythm.     Pulses: Normal pulses.     Heart sounds: Normal heart sounds.  Pulmonary:     Effort: Pulmonary  effort is normal.     Breath sounds: Normal breath sounds.  Abdominal:     General: Bowel sounds are normal.     Palpations: Abdomen is soft. There is no hepatomegaly, splenomegaly or mass.     Tenderness: There is no abdominal tenderness.     Hernia: No hernia is present.  Musculoskeletal:        General: No tenderness.     Cervical back: Neck supple.  Right lower leg: No edema.     Left lower leg: No edema.  Skin:    Findings: No rash.  Neurological:     Mental Status: She is alert and oriented to person, place, and time.     Motor: No weakness.  Psychiatric:        Mood and Affect: Mood and affect normal.        Behavior: Behavior normal.     BP 128/66   Pulse 66   Ht _0  (1.626 m)   Wt 232 lb 8 oz (105.5 kg)   BMI 39.91 kg/m  Wt Readings from Last 3 Encounters:  01/16/22 232 lb 8 oz (105.5 kg)  11/21/21 234 lb 12.6 oz (106.5 kg)  10/17/21 234 lb 14.4 oz (106.5 kg)     Health Maintenance Due  Topic Date Due   COVID-19 Vaccine (1) Never done   FOOT EXAM  Never done   TETANUS/TDAP  Never done   Zoster Vaccines- Shingrix (1 of 2) Never done   INFLUENZA VACCINE  12/26/2021    There are no preventive care reminders to display for this patient.  Lab Results  Component Value Date   TSH 0.84 01/16/2021   Lab Results  Component Value Date   WBC 5.5 01/01/2022   HGB 11.9 (L) 01/01/2022   HCT 37.5 01/01/2022   MCV 97.7 01/01/2022   PLT 202 01/01/2022   Lab Results  Component Value Date   NA 138 01/01/2022   K 3.5 01/01/2022   CO2 25 01/01/2022   GLUCOSE 180 (H) 01/01/2022   BUN 22 01/01/2022   CREATININE 0.94 01/01/2022   BILITOT 0.6 11/21/2021   ALKPHOS 66 11/21/2021   AST 22 11/21/2021   ALT 13 11/21/2021   PROT 7.1 11/21/2021   ALBUMIN 3.4 (L) 11/21/2021   CALCIUM 9.8 01/01/2022   ANIONGAP 7 01/01/2022   EGFR 63 01/16/2021   Lab Results  Component Value Date   CHOL 176 01/16/2021   Lab Results  Component Value Date   HDL 50 01/16/2021    Lab Results  Component Value Date   LDLCALC 103 (H) 01/16/2021   Lab Results  Component Value Date   TRIG 124 01/16/2021   Lab Results  Component Value Date   CHOLHDL 3.5 01/16/2021   Lab Results  Component Value Date   HGBA1C 4.7 01/16/2022      Assessment & Plan:   Problem List Items Addressed This Visit       Cardiovascular and Mediastinum   Essential hypertension     Patient denies any chest pain or shortness of breath there is no history of palpitation or paroxysmal nocturnal dyspnea   patient was advised to follow low-salt low-cholesterol diet    ideally I want to keep systolic blood pressure below 130 mmHg, patient was asked to check blood pressure one times a week and give me a report on that.  Patient will be follow-up in 3 months  or earlier as needed, patient will call me back for any change in the cardiovascular symptoms Patient was advised to buy a book from local bookstore concerning blood pressure and read several chapters  every day.  This will be supplemented by some of the material we will give him from the office.  Patient should also utilize other resources like YouTube and Internet to learn more about the blood pressure and the diet.        Respiratory   Asthma    Stable at  the present time        Endocrine   Type 2 diabetes mellitus without complication, without long-term current use of insulin ([place]) - Primary    - The patient's blood sugar is labile on med. - The patient will continue the current treatment regimen.  - I encouraged the patient to regularly check blood sugar.  - I encouraged the patient to monitor diet. I encouraged the patient to eat low-carb and low-sugar to help prevent blood sugar spikes.  - I encouraged the patient to continue following their prescribed treatment plan for diabetes - I informed the patient to get help if blood sugar drops below 66m/dL, or if suddenly have trouble thinking clearly or breathing.  Patient was  advised to buy a book on diabetes from a local bookstore or from [place]  Patient should read 2 chapters every day to keep the motivation going, this is in addition to some of the materials we provided them from the office.  There are other resources on the Internet like YouTube and [employer]kipedia to get an education on the diabetes      Relevant Orders   POCT glucose (manual entry) (Completed)   POCT HgB A1C (Completed)     Musculoskeletal and Integument   Primary osteoarthritis of right knee    Right knee was injected with Kenalog shot 40 mg     Joint Injection/Arthrocentesis  Date/Time: 01/16/2022 9:15 AM  Performed by: MCletis Athens MD Authorized by: MCletis Athens MD  Body area: knee Joint: right knee Local anesthesia used: yes  Anesthesia: Local anesthesia used: yes Local Anesthetic: lidocaine 1% with epinephrine  Sedation: Patient sedated: no  Preparation: Patient was prepped and draped in the usual sterile fashion. Needle size: 22 G Ultrasound guidance: no Approach: lateral Aspirate: serous Triamcinolone amount: 40 mg Lidocaine 1% amount: 1 mL Comments: Under local anesthesia with 1% lidocaine right knee was injected with 40 mg of Kenalog patient tolerated the procedure well and she was asked to stay in the waiting room for 5 minutes after the procedure.      No orders of the defined types were placed in this encounter.   Follow-up: No follow-ups on file.    JCletis Athens MD

## 2022-01-16 NOTE — Assessment & Plan Note (Signed)
Stable at the present time. 

## 2022-01-22 ENCOUNTER — Other Ambulatory Visit: Payer: Self-pay | Admitting: Internal Medicine

## 2022-02-14 ENCOUNTER — Ambulatory Visit (INDEPENDENT_AMBULATORY_CARE_PROVIDER_SITE_OTHER): Payer: Medicare Other | Admitting: Internal Medicine

## 2022-02-14 ENCOUNTER — Encounter: Payer: Self-pay | Admitting: Internal Medicine

## 2022-02-14 VITALS — BP 130/76 | HR 67 | Ht 64.0 in | Wt 231.9 lb

## 2022-02-14 DIAGNOSIS — M1710 Unilateral primary osteoarthritis, unspecified knee: Secondary | ICD-10-CM

## 2022-02-14 DIAGNOSIS — M76892 Other specified enthesopathies of left lower limb, excluding foot: Secondary | ICD-10-CM | POA: Insufficient documentation

## 2022-02-14 DIAGNOSIS — I10 Essential (primary) hypertension: Secondary | ICD-10-CM

## 2022-02-14 DIAGNOSIS — J4520 Mild intermittent asthma, uncomplicated: Secondary | ICD-10-CM | POA: Diagnosis not present

## 2022-02-14 DIAGNOSIS — Z6841 Body Mass Index (BMI) 40.0 and over, adult: Secondary | ICD-10-CM

## 2022-02-14 DIAGNOSIS — E118 Type 2 diabetes mellitus with unspecified complications: Secondary | ICD-10-CM

## 2022-02-14 DIAGNOSIS — E119 Type 2 diabetes mellitus without complications: Secondary | ICD-10-CM

## 2022-02-14 LAB — GLUCOSE, POCT (MANUAL RESULT ENTRY): POC Glucose: 109 mg/dl — AB (ref 70–99)

## 2022-02-14 NOTE — Progress Notes (Addendum)
Established Patient Office Visit  Subjective:  Patient ID: Vanessa Vazquez, female    DOB: 1934-10-12  Age: 87 y.o. MRN: [id number]  CC:  Chief Complaint  Patient presents with   Diabetes    Diabetes    Vanessa Vazquez presents for lt knee pain  Past Medical History:  Diagnosis Date   Asthma    Diabetes mellitus without complication ([place])    GERD (gastroesophageal reflux disease)    Hypercholesteremia    Hypertension     Past Surgical History:  Procedure Laterality Date   CESAREAN SECTION     COLONOSCOPY WITH PROPOFOL N/A 03/19/2016   Procedure: COLONOSCOPY WITH PROPOFOL;  Surgeon: Manya Silvas, MD;  Location: [place];  Service: Endoscopy;  Laterality: N/A;    Family History  Problem Relation Age of Onset   Lung cancer Father    Leukemia Sister    Diabetes Sister    Asthma Child     Social History   Socioeconomic History   Marital status: Widowed    Spouse name: Not on file   Number of children: Not on file   Years of education: Not on file   Highest education level: GED or equivalent  Occupational History   Not on file  Tobacco Use   Smoking status: Former   Smokeless tobacco: Never  Substance and Sexual Activity   Alcohol use: No    Alcohol/week: 0.0 standard drinks of alcohol   Drug use: No   Sexual activity: Never  Other Topics Concern   Not on file  Social History Narrative   Not on file   Social Determinants of Health   Financial Resource Strain: Low Risk  (05/11/2021)   Overall Financial Resource Strain (CARDIA)    Difficulty of Paying Living Expenses: Not hard at all  Food Insecurity: No Food Insecurity (05/11/2021)   Hunger Vital Sign    Worried About Running Out of Food in the Last Year: Never true    [place] in the Last Year: Never true  Transportation Needs: No Transportation Needs (05/11/2021)   PRAPARE - Hydrologist (Medical): No    Lack of Transportation (Non-Medical): No  Physical  Activity: Insufficiently Active (05/11/2021)   Exercise Vital Sign    Days of Exercise per Week: 5 days    Minutes of Exercise per Session: 10 min  Stress: No Stress Concern Present (05/11/2021)   [place]    Feeling of Stress : Not at all  Social Connections: Moderately Isolated (05/11/2021)   Social Connection and Isolation Panel [NHANES]    Frequency of Communication with Friends and Family: More than three times a week    Frequency of Social Gatherings with Friends and Family: More than three times a week    Attends Religious Services: More than 4 times per year    Active Member of [employer] or Organizations: No    Attends Archivist Meetings: Never    Marital Status: Widowed  Intimate Partner Violence: Not At Risk (05/11/2021)   Humiliation, Afraid, Rape, and Kick questionnaire    Fear of Current or Ex-Partner: No    Emotionally Abused: No    Physically Abused: No    Sexually Abused: No     Current Outpatient Medications:    albuterol (VENTOLIN HFA) 108 (90 Base) MCG/ACT inhaler, Inhale 2 puffs into the lungs every 4 (four) hours as needed for wheezing or shortness  of breath., Disp: 18 g, Rfl: 6   atenolol (TENORMIN) 25 MG tablet, TAKE 1 TABLET BY MOUTH ONCE DAILY, Disp: 30 tablet, Rfl: 6   atorvastatin (LIPITOR) 40 MG tablet, Take 1 tablet (40 mg total) by mouth daily., Disp: 90 tablet, Rfl: 3   furosemide (LASIX) 20 MG tablet, TAKE 1 TABLET BY MOUTH EVERY OTHER DAY, Disp: 30 tablet, Rfl: 3   glimepiride (AMARYL) 2 MG tablet, TAKE 1 TABLET BY MOUTH TWICE DAILY, Disp: 180 tablet, Rfl: 3   JANUVIA 100 MG tablet, TAKE 1 TABLET BY MOUTH ONCE DAILY, Disp: 90 tablet, Rfl: 3   loratadine (CLARITIN) 10 MG tablet, Take 1 tablet (10 mg total) by mouth daily., Disp: 90 tablet, Rfl: 3   losartan-hydrochlorothiazide (HYZAAR) 50-12.5 MG tablet, TAKE 2 TABLETS BY MOUTH ONCE DAILY, Disp: 180 tablet, Rfl: 2   metFORMIN  (GLUCOPHAGE) 1000 MG tablet, Take 1 tablet (1,000 mg total) by mouth 2 (two) times daily., Disp: 180 tablet, Rfl: 3   Misc. Devices (WALKER) MISC, 1 each by Does not apply route daily., Disp: 1 each, Rfl: 0   omeprazole (PRILOSEC) 20 MG capsule, TAKE 1 CAPSULE BY MOUTH ONCE DAILY, Disp: 30 capsule, Rfl: 3   pregabalin (LYRICA) 50 MG capsule, TAKE 1 CAPSULE BY MOUTH TWICE DAILY, Disp: 60 capsule, Rfl: 4   Respiratory Therapy Supplies (NEBULIZER) DEVI, 1 Device by Does not apply route 2 (two) times daily as needed., Disp: 1 each, Rfl: 0   ferrous sulfate 300 (60 Fe) MG/5ML syrup, Take 5 mLs (300 mg total) by mouth daily., Disp: 150 mL, Rfl: 3   No Known Allergies  ROS Review of Systems  Constitutional: Negative.   HENT: Negative.    Eyes: Negative.   Respiratory: Negative.    Cardiovascular: Negative.   Gastrointestinal: Negative.   Endocrine: Negative.   Genitourinary: Negative.   Musculoskeletal: Negative.   Skin: Negative.   Allergic/Immunologic: Negative.   Neurological: Negative.   Hematological: Negative.   Psychiatric/Behavioral: Negative.    All other systems reviewed and are negative.     Objective:    Physical Exam Vitals reviewed.  Constitutional:      Appearance: Normal appearance.  HENT:     Mouth/Throat:     Mouth: Mucous membranes are moist.  Eyes:     Pupils: Pupils are equal, round, and reactive to light.  Neck:     Vascular: No carotid bruit.  Cardiovascular:     Rate and Rhythm: Normal rate and regular rhythm.     Pulses: Normal pulses.     Heart sounds: Normal heart sounds.  Pulmonary:     Effort: Pulmonary effort is normal.     Breath sounds: Normal breath sounds.  Abdominal:     General: Bowel sounds are normal.     Palpations: Abdomen is soft. There is no hepatomegaly, splenomegaly or mass.     Tenderness: There is no abdominal tenderness.     Hernia: No hernia is present.  Musculoskeletal:        General: No tenderness.     Cervical back:  Neck supple.     Right lower leg: No edema.     Left lower leg: No edema.  Skin:    Findings: No rash.  Neurological:     Mental Status: She is alert and oriented to person, place, and time.     Motor: No weakness.  Psychiatric:        Mood and Affect: Mood and affect normal.  Behavior: Behavior normal.     BP 130/76   Pulse 67   Ht 5' 4"  (1.626 m)   Wt 231 lb 14.4 oz (105.2 kg)   BMI 39.81 kg/m  Wt Readings from Last 3 Encounters:  02/14/22 231 lb 14.4 oz (105.2 kg)  01/16/22 232 lb 8 oz (105.5 kg)  11/21/21 234 lb 12.6 oz (106.5 kg)     Health Maintenance Due  Topic Date Due   COVID-19 Vaccine (1) Never done   FOOT EXAM  Never done   Diabetic kidney evaluation - Urine ACR  09/15/2016    There are no preventive care reminders to display for this patient.  Lab Results  Component Value Date   TSH 0.84 01/16/2021   Lab Results  Component Value Date   WBC 5.5 01/01/2022   HGB 11.9 (L) 01/01/2022   HCT 37.5 01/01/2022   MCV 97.7 01/01/2022   PLT 202 01/01/2022   Lab Results  Component Value Date   NA 138 01/01/2022   K 3.5 01/01/2022   CO2 25 01/01/2022   GLUCOSE 180 (H) 01/01/2022   BUN 22 01/01/2022   CREATININE 0.94 01/01/2022   BILITOT 0.6 11/21/2021   ALKPHOS 66 11/21/2021   AST 22 11/21/2021   ALT 13 11/21/2021   PROT 7.1 11/21/2021   ALBUMIN 3.4 (L) 11/21/2021   CALCIUM 9.8 01/01/2022   ANIONGAP 7 01/01/2022   EGFR 63 01/16/2021   Lab Results  Component Value Date   CHOL 176 01/16/2021   Lab Results  Component Value Date   HDL 50 01/16/2021   Lab Results  Component Value Date   LDLCALC 103 (H) 01/16/2021   Lab Results  Component Value Date   TRIG 124 01/16/2021   Lab Results  Component Value Date   CHOLHDL 3.5 01/16/2021   Lab Results  Component Value Date   HGBA1C 4.7 01/16/2022      Assessment & Plan:   Problem List Items Addressed This Visit       Cardiovascular and Mediastinum   Essential hypertension      Respiratory   Asthma    Stable at the present time        Endocrine   Type 2 diabetes mellitus without complication, without long-term current use of insulin (HCC)    Blood sugar 109      Relevant Orders   POCT glucose (manual entry) (Completed)   Diabetic foot ([place]) - Primary     checked her diabetic ulcer        Musculoskeletal and Integument   Knee capsulitis, left    We will do cortisone knee injection        Other   Class 3 severe obesity due to excess calories with serious comorbidity and body mass index (BMI) of 40.0 to 44.9 in adult [facility])    - I encouraged the patient to lose weight.  - I educated them on making healthy dietary choices including eating more fruits and vegetables and less fried foods. - I encouraged the patient to exercise more, and educated on the benefits of exercise including weight loss, diabetes prevention, and hypertension prevention.   Dietary counseling with a registered dietician  Referral to a weight management support group (e.g. Weight Watchers, Overeaters Anonymous)  If your BMI is greater than 29 or you have gained more than 15 pounds you should work on weight loss.  Attend a healthy cooking class       Other Visit Diagnoses  Knee arthropathy         Joint Injection/Arthrocentesis  Date/Time: 02/14/2022 9:07 AM  Performed by: Cletis Athens, MD Authorized by: Cletis Athens, MD  Indications: pain  Body area: knee Joint: left knee  Sedation: Patient sedated: yes  Needle size: 22 G Approach: medial Triamcinolone amount: 40 mg Lidocaine 1% amount: 1 mL Patient tolerance: patient tolerated the procedure well with no immediate complications      No orders of the defined types were placed in this encounter.   Follow-up: No follow-ups on file.    Cletis Athens, MD

## 2022-02-14 NOTE — Assessment & Plan Note (Signed)
We will do cortisone knee injection

## 2022-02-14 NOTE — Assessment & Plan Note (Signed)

## 2022-02-14 NOTE — Assessment & Plan Note (Signed)
checked her diabetic ulcer

## 2022-02-14 NOTE — Assessment & Plan Note (Signed)
Blood sugar 109

## 2022-02-14 NOTE — Assessment & Plan Note (Signed)
Stable at the present time. 

## 2022-02-27 ENCOUNTER — Other Ambulatory Visit: Payer: Self-pay | Admitting: Internal Medicine

## 2022-04-11 ENCOUNTER — Ambulatory Visit (INDEPENDENT_AMBULATORY_CARE_PROVIDER_SITE_OTHER): Payer: Medicare Other | Admitting: Nurse Practitioner

## 2022-04-11 ENCOUNTER — Encounter: Payer: Self-pay | Admitting: Nurse Practitioner

## 2022-04-11 VITALS — BP 152/68 | HR 81 | Temp 97.2°F | Ht 64.0 in | Wt 234.9 lb

## 2022-04-11 DIAGNOSIS — R5383 Other fatigue: Secondary | ICD-10-CM

## 2022-04-11 DIAGNOSIS — I10 Essential (primary) hypertension: Secondary | ICD-10-CM

## 2022-04-11 NOTE — Progress Notes (Signed)
Established Patient Office Visit  Subjective:  Patient ID: GELENE RECKTENWALD, female    DOB: 1934-10-18  Age: 87 y.o. MRN: [id number]  CC:  Chief Complaint  Patient presents with   Follow-up    After taking furosemide she feels wiped out for a couple of days after. She wants to know if maybe potassium is low. Has not taken her BP meds yet today.      HPI  HADLEIGH FELBER presents with complaint of being tired after taking furosemide she would like to get the potassium.   She has history of hypertension, hyperlipidemia, diabetes, asthma and GERD.  This patient states that she has not taken her blood pressure medication yet. HPI   Past Medical History:  Diagnosis Date   Asthma    Diabetes mellitus without complication ([place])    GERD (gastroesophageal reflux disease)    Hypercholesteremia    Hypertension     Past Surgical History:  Procedure Laterality Date   CESAREAN SECTION     COLONOSCOPY WITH PROPOFOL N/A 03/19/2016   Procedure: COLONOSCOPY WITH PROPOFOL;  Surgeon: Manya Silvas, MD;  Location: [place];  Service: Endoscopy;  Laterality: N/A;    Family History  Problem Relation Age of Onset   Lung cancer Father    Leukemia Sister    Diabetes Sister    Asthma Child     Social History   Socioeconomic History   Marital status: Widowed    Spouse name: Not on file   Number of children: Not on file   Years of education: Not on file   Highest education level: GED or equivalent  Occupational History   Not on file  Tobacco Use   Smoking status: Former   Smokeless tobacco: Never  Substance and Sexual Activity   Alcohol use: No    Alcohol/week: 0.0 standard drinks of alcohol   Drug use: No   Sexual activity: Never  Other Topics Concern   Not on file  Social History Narrative   Not on file   Social Determinants of Health   Financial Resource Strain: Low Risk  (05/11/2021)   Overall Financial Resource Strain (CARDIA)    Difficulty of Paying Living  Expenses: Not hard at all  Food Insecurity: No Food Insecurity (05/11/2021)   Hunger Vital Sign    Worried About Running Out of Food in the Last Year: Never true    [place] in the Last Year: Never true  Transportation Needs: No Transportation Needs (05/11/2021)   PRAPARE - Hydrologist (Medical): No    Lack of Transportation (Non-Medical): No  Physical Activity: Insufficiently Active (05/11/2021)   Exercise Vital Sign    Days of Exercise per Week: 5 days    Minutes of Exercise per Session: 10 min  Stress: No Stress Concern Present (05/11/2021)   [place]    Feeling of Stress : Not at all  Social Connections: Moderately Isolated (05/11/2021)   Social Connection and Isolation Panel [NHANES]    Frequency of Communication with Friends and Family: More than three times a week    Frequency of Social Gatherings with Friends and Family: More than three times a week    Attends Religious Services: More than 4 times per year    Active Member of [employer] or Organizations: No    Attends Archivist Meetings: Never    Marital Status: Widowed  Intimate Partner Violence: Not  At Risk (05/11/2021)   Humiliation, Afraid, Rape, and Kick questionnaire    Fear of Current or Ex-Partner: No    Emotionally Abused: No    Physically Abused: No    Sexually Abused: No     Outpatient Medications Prior to Visit  Medication Sig Dispense Refill   albuterol (VENTOLIN HFA) 108 (90 Base) MCG/ACT inhaler Inhale 2 puffs into the lungs every 4 (four) hours as needed for wheezing or shortness of breath. 18 g 6   atenolol (TENORMIN) 25 MG tablet TAKE 1 TABLET BY MOUTH ONCE DAILY 30 tablet 6   atorvastatin (LIPITOR) 40 MG tablet Take 1 tablet (40 mg total) by mouth daily. 90 tablet 3   ferrous sulfate 300 (60 Fe) MG/5ML syrup Take 5 mLs (300 mg total) by mouth daily. 150 mL 3   furosemide (LASIX) 20 MG tablet  TAKE 1 TABLET BY MOUTH EVERY OTHER DAY 30 tablet 3   glimepiride (AMARYL) 2 MG tablet TAKE 1 TABLET BY MOUTH TWICE DAILY 180 tablet 3   JANUVIA 100 MG tablet TAKE 1 TABLET BY MOUTH ONCE DAILY 90 tablet 3   loratadine (CLARITIN) 10 MG tablet Take 1 tablet (10 mg total) by mouth daily. 90 tablet 3   losartan-hydrochlorothiazide (HYZAAR) 50-12.5 MG tablet TAKE 2 TABLETS BY MOUTH ONCE DAILY 180 tablet 2   metFORMIN (GLUCOPHAGE) 1000 MG tablet Take 1 tablet (1,000 mg total) by mouth 2 (two) times daily. 180 tablet 3   Misc. Devices (WALKER) MISC 1 each by Does not apply route daily. 1 each 0   omeprazole (PRILOSEC) 20 MG capsule TAKE 1 CAPSULE BY MOUTH ONCE DAILY 30 capsule 3   pregabalin (LYRICA) 50 MG capsule TAKE 1 CAPSULE BY MOUTH TWICE DAILY 60 capsule 4   Respiratory Therapy Supplies (NEBULIZER) DEVI 1 Device by Does not apply route 2 (two) times daily as needed. 1 each 0   No facility-administered medications prior to visit.    No Known Allergies  ROS Review of Systems  Constitutional: Negative.   HENT: Negative.    Eyes: Negative.   Respiratory:  Negative for chest tightness and shortness of breath.   Cardiovascular:  Negative for chest pain and palpitations.  Gastrointestinal: Negative.   Genitourinary: Negative.   Musculoskeletal: Negative.   Neurological:  Negative for dizziness, facial asymmetry and headaches.  Psychiatric/Behavioral: Negative.        Objective:    Physical Exam Constitutional:      Appearance: Normal appearance. She is obese.  HENT:     Head: Normocephalic.     Right Ear: Tympanic membrane normal.     Left Ear: Tympanic membrane normal.     Nose: Nose normal.     Mouth/Throat:     Mouth: Mucous membranes are moist.     Pharynx: Oropharynx is clear.  Eyes:     Extraocular Movements: Extraocular movements intact.     Conjunctiva/sclera: Conjunctivae normal.     Pupils: Pupils are equal, round, and reactive to light.  Cardiovascular:     Rate and  Rhythm: Normal rate and regular rhythm.     Pulses: Normal pulses.     Heart sounds: Normal heart sounds.  Pulmonary:     Effort: Pulmonary effort is normal. No respiratory distress.     Breath sounds: Normal breath sounds. No rhonchi.  Abdominal:     General: Bowel sounds are normal.     Palpations: Abdomen is soft. There is no mass.     Tenderness: There is no  abdominal tenderness.     Hernia: No hernia is present.  Musculoskeletal:        General: Normal range of motion.     Cervical back: Neck supple. No tenderness.  Skin:    General: Skin is warm.     Capillary Refill: Capillary refill takes less than 2 seconds.  Neurological:     General: No focal deficit present.     Mental Status: She is alert and oriented to person, place, and time. Mental status is at baseline.  Psychiatric:        Mood and Affect: Mood normal.        Behavior: Behavior normal.        Thought Content: Thought content normal.        Judgment: Judgment normal.     BP (!) 152/68   Pulse 81   Temp (!) 97.2 F (36.2 C) (Temporal)   Ht _0  (1.626 m)   Wt 234 lb 14.4 oz (106.5 kg)   SpO2 96%   BMI 40.32 kg/m  Wt Readings from Last 3 Encounters:  04/11/22 234 lb 14.4 oz (106.5 kg)  02/14/22 231 lb 14.4 oz (105.2 kg)  01/16/22 232 lb 8 oz (105.5 kg)     Health Maintenance  Topic Date Due   COVID-19 Vaccine (1) Never done   FOOT EXAM  Never done   Diabetic kidney evaluation - Urine ACR  09/15/2016   Medicare Annual Wellness (AWV)  05/11/2022   Pneumonia Vaccine 40+ Years old (2 - PCV) 04/18/2022 (Originally 04/11/2004)   Zoster Vaccines- Shingrix (1 of 2) 05/16/2022 (Originally 03/12/1954)   HEMOGLOBIN A1C  07/19/2022   OPHTHALMOLOGY EXAM  11/16/2022   Diabetic kidney evaluation - GFR measurement  04/12/2023   DEXA SCAN  Completed   HPV VACCINES  Aged Out   INFLUENZA VACCINE  Discontinued    There are no preventive care reminders to display for this patient.  Lab Results  Component  Value Date   TSH 1.03 04/11/2022   Lab Results  Component Value Date   WBC 6.1 04/11/2022   HGB 11.6 (L) 04/11/2022   HCT 35.6 04/11/2022   MCV 96.7 04/11/2022   PLT 240 04/11/2022   Lab Results  Component Value Date   NA 141 04/11/2022   K 4.3 04/11/2022   CO2 30 04/11/2022   GLUCOSE 162 (H) 04/11/2022   BUN 25 04/11/2022   CREATININE 0.94 04/11/2022   BILITOT 0.5 04/11/2022   ALKPHOS 66 11/21/2021   AST 18 04/11/2022   ALT 15 04/11/2022   PROT 7.1 04/11/2022   ALBUMIN 3.4 (L) 11/21/2021   CALCIUM 10.0 04/11/2022   ANIONGAP 7 01/01/2022   EGFR 59 (L) 04/11/2022   Lab Results  Component Value Date   CHOL 176 01/16/2021   Lab Results  Component Value Date   HDL 50 01/16/2021   Lab Results  Component Value Date   LDLCALC 103 (H) 01/16/2021   Lab Results  Component Value Date   TRIG 124 01/16/2021   Lab Results  Component Value Date   CHOLHDL 3.5 01/16/2021   Lab Results  Component Value Date   HGBA1C 4.7 01/16/2022      Assessment & Plan:   Problem List Items Addressed This Visit       Cardiovascular and Mediastinum   Essential hypertension - Primary    Patient BP  Vitals:   04/11/22 1019 04/11/22 1021  BP: (!) 145/71 (!) 152/68    in the office  today. Advised pt to follow a low sodium and heart healthy diet. Continue losartan HCTZ and atenolol. Encouraged her to take the medication regularly and will recheck her BP in the week.         Other   Morbid obesity ([place])    Body mass index is 40.32 kg/m. Advised pt to lose weight. Advised patient to avoid trans fat, fatty and fried food. Follow a regular physical activity schedule. Went over the risk of chronic diseases with increased weight.          Fatigue    Labs ordered CBC, metabolic panel and TSH.      Relevant Orders   CBC with Differential/Platelet (Completed)   COMPLETE METABOLIC PANEL WITH GFR (Completed)   TSH (Completed)     No orders of the defined types were  placed in this encounter.    Follow-up: No follow-ups on file.    Theresia Lo, NP

## 2022-04-11 NOTE — Assessment & Plan Note (Addendum)
Patient BP  Vitals:   04/11/22 1019 04/11/22 1021  BP: (!) 145/71 (!) 152/68    in the office today. Advised pt to follow a low sodium and heart healthy diet. Continue losartan HCTZ and atenolol. Encouraged her to take the medication regularly and will recheck her BP in the week.

## 2022-04-11 NOTE — Progress Notes (Signed)
Established Patient Office Visit  Subjective:  Patient ID: Vanessa Vazquez, female    DOB: 02-22-1935  Age: 87 y.o. MRN: [id number]  CC:  Chief Complaint  Patient presents with   Follow-up    After taking furosemide she feels wiped out for a couple of days after. She wants to know if maybe potassium is low. Has not taken her BP meds yet today.      HPI  Vanessa NOVAKOVICH presents with concern of feeling fatigued after taking furosemide. She is taking furosemide once a week. She has h/o diabetes, hypertension, asthma, neuropathy and hyperlipidemia. Patient states that she has not taken her BP medication yet.   HPI   Past Medical History:  Diagnosis Date   Asthma    Diabetes mellitus without complication ([place])    GERD (gastroesophageal reflux disease)    Hypercholesteremia    Hypertension     Past Surgical History:  Procedure Laterality Date   CESAREAN SECTION     COLONOSCOPY WITH PROPOFOL N/A 03/19/2016   Procedure: COLONOSCOPY WITH PROPOFOL;  Surgeon: Vanessa Silvas, MD;  Location: [facility];  Service: Endoscopy;  Laterality: N/A;    Family History  Problem Relation Age of Onset   Lung cancer Father    Leukemia Sister    Diabetes Sister    Asthma Child     Social History   Socioeconomic History   Marital status: Widowed    Spouse name: Not on file   Number of children: Not on file   Years of education: Not on file   Highest education level: GED or equivalent  Occupational History   Not on file  Tobacco Use   Smoking status: Former   Smokeless tobacco: Never  Substance and Sexual Activity   Alcohol use: No    Alcohol/week: 0.0 standard drinks of alcohol   Drug use: No   Sexual activity: Never  Other Topics Concern   Not on file  Social History Narrative   Not on file   Social Determinants of Health   Financial Resource Strain: Low Risk  (05/11/2021)   Overall Financial Resource Strain (CARDIA)    Difficulty of Paying Living Expenses: Not hard at  all  Food Insecurity: No Food Insecurity (05/11/2021)   Hunger Vital Sign    Worried About Running Out of Food in the Last Year: Never true    [employer] in the Last Year: Never true  Transportation Needs: No Transportation Needs (05/11/2021)   PRAPARE - Hydrologist (Medical): No    Lack of Transportation (Non-Medical): No  Physical Activity: Insufficiently Active (05/11/2021)   Exercise Vital Sign    Days of Exercise per Week: 5 days    Minutes of Exercise per Session: 10 min  Stress: No Stress Concern Present (05/11/2021)   [place]    Feeling of Stress : Not at all  Social Connections: Moderately Isolated (05/11/2021)   Social Connection and Isolation Panel [NHANES]    Frequency of Communication with Friends and Family: More than three times a week    Frequency of Social Gatherings with Friends and Family: More than three times a week    Attends Religious Services: More than 4 times per year    Active Member of [employer] or Organizations: No    Attends Archivist Meetings: Never    Marital Status: Widowed  Intimate Partner Violence: Not At Risk (05/11/2021)  Humiliation, Afraid, Rape, and Kick questionnaire    Fear of Current or Ex-Partner: No    Emotionally Abused: No    Physically Abused: No    Sexually Abused: No     Outpatient Medications Prior to Visit  Medication Sig Dispense Refill   albuterol (VENTOLIN HFA) 108 (90 Base) MCG/ACT inhaler Inhale 2 puffs into the lungs every 4 (four) hours as needed for wheezing or shortness of breath. 18 g 6   atenolol (TENORMIN) 25 MG tablet TAKE 1 TABLET BY MOUTH ONCE DAILY 30 tablet 6   atorvastatin (LIPITOR) 40 MG tablet Take 1 tablet (40 mg total) by mouth daily. 90 tablet 3   ferrous sulfate 300 (60 Fe) MG/5ML syrup Take 5 mLs (300 mg total) by mouth daily. 150 mL 3   furosemide (LASIX) 20 MG tablet TAKE 1 TABLET BY MOUTH  EVERY OTHER DAY 30 tablet 3   glimepiride (AMARYL) 2 MG tablet TAKE 1 TABLET BY MOUTH TWICE DAILY 180 tablet 3   JANUVIA 100 MG tablet TAKE 1 TABLET BY MOUTH ONCE DAILY 90 tablet 3   loratadine (CLARITIN) 10 MG tablet Take 1 tablet (10 mg total) by mouth daily. 90 tablet 3   losartan-hydrochlorothiazide (HYZAAR) 50-12.5 MG tablet TAKE 2 TABLETS BY MOUTH ONCE DAILY 180 tablet 2   metFORMIN (GLUCOPHAGE) 1000 MG tablet Take 1 tablet (1,000 mg total) by mouth 2 (two) times daily. 180 tablet 3   Misc. Devices (WALKER) MISC 1 each by Does not apply route daily. 1 each 0   omeprazole (PRILOSEC) 20 MG capsule TAKE 1 CAPSULE BY MOUTH ONCE DAILY 30 capsule 3   pregabalin (LYRICA) 50 MG capsule TAKE 1 CAPSULE BY MOUTH TWICE DAILY 60 capsule 4   Respiratory Therapy Supplies (NEBULIZER) DEVI 1 Device by Does not apply route 2 (two) times daily as needed. 1 each 0   No facility-administered medications prior to visit.    No Known Allergies  ROS Review of Systems  Constitutional:  Positive for fatigue.  HENT: Negative.    Eyes: Negative.   Respiratory:  Negative for chest tightness.   Cardiovascular:  Positive for leg swelling. Negative for chest pain and palpitations.  Gastrointestinal: Negative.   Genitourinary: Negative.   Musculoskeletal: Negative.   Neurological:  Negative for dizziness, facial asymmetry and headaches.  Psychiatric/Behavioral:  Negative for agitation, behavioral problems and confusion.       Objective:    Physical Exam Constitutional:      Appearance: Normal appearance. She is obese.  HENT:     Head: Normocephalic.     Right Ear: Tympanic membrane normal.     Left Ear: Tympanic membrane normal.     Nose: Nose normal.     Mouth/Throat:     Mouth: Mucous membranes are moist.     Pharynx: Oropharynx is clear.  Eyes:     Extraocular Movements: Extraocular movements intact.     Conjunctiva/sclera: Conjunctivae normal.     Pupils: Pupils are equal, round, and reactive  to light.  Cardiovascular:     Rate and Rhythm: Normal rate and regular rhythm.     Pulses: Normal pulses.     Heart sounds: Normal heart sounds.  Pulmonary:     Effort: Pulmonary effort is normal. No respiratory distress.     Breath sounds: Normal breath sounds. No rhonchi.  Abdominal:     General: Bowel sounds are normal.     Palpations: Abdomen is soft. There is no mass.     Tenderness:  There is no abdominal tenderness.     Hernia: No hernia is present.  Musculoskeletal:     Cervical back: Neck supple. No tenderness.     Right lower leg: Edema present.     Left lower leg: Edema present.  Skin:    General: Skin is warm.     Capillary Refill: Capillary refill takes less than 2 seconds.  Neurological:     General: No focal deficit present.     Mental Status: She is alert and oriented to person, place, and time. Mental status is at baseline.  Psychiatric:        Mood and Affect: Mood normal.        Behavior: Behavior normal.        Thought Content: Thought content normal.        Judgment: Judgment normal.     BP (!) 152/68   Pulse 81   Temp (!) 97.2 F (36.2 C) (Temporal)   Ht _0  (1.626 m)   Wt 234 lb 14.4 oz (106.5 kg)   SpO2 96%   BMI 40.32 kg/m  Wt Readings from Last 3 Encounters:  04/11/22 234 lb 14.4 oz (106.5 kg)  02/14/22 231 lb 14.4 oz (105.2 kg)  01/16/22 232 lb 8 oz (105.5 kg)     Health Maintenance  Topic Date Due   COVID-19 Vaccine (1) Never done   FOOT EXAM  Never done   Diabetic kidney evaluation - Urine ACR  09/15/2016   Medicare Annual Wellness (AWV)  05/11/2022   Pneumonia Vaccine 44+ Years old (2 - PCV) 04/18/2022 (Originally 04/11/2004)   Zoster Vaccines- Shingrix (1 of 2) 05/16/2022 (Originally 03/12/1956)   TETANUS/TDAP  02/15/2023 (Originally 03/12/1956)   HEMOGLOBIN A1C  07/19/2022   OPHTHALMOLOGY EXAM  11/16/2022   Diabetic kidney evaluation - GFR measurement  01/02/2023   DEXA SCAN  Completed   HPV VACCINES  Aged Out   INFLUENZA  VACCINE  Discontinued    There are no preventive care reminders to display for this patient.  Lab Results  Component Value Date   TSH 0.84 01/16/2021   Lab Results  Component Value Date   WBC 5.5 01/01/2022   HGB 11.9 (L) 01/01/2022   HCT 37.5 01/01/2022   MCV 97.7 01/01/2022   PLT 202 01/01/2022   Lab Results  Component Value Date   NA 138 01/01/2022   K 3.5 01/01/2022   CO2 25 01/01/2022   GLUCOSE 180 (H) 01/01/2022   BUN 22 01/01/2022   CREATININE 0.94 01/01/2022   BILITOT 0.6 11/21/2021   ALKPHOS 66 11/21/2021   AST 22 11/21/2021   ALT 13 11/21/2021   PROT 7.1 11/21/2021   ALBUMIN 3.4 (L) 11/21/2021   CALCIUM 9.8 01/01/2022   ANIONGAP 7 01/01/2022   EGFR 63 01/16/2021   Lab Results  Component Value Date   CHOL 176 01/16/2021   Lab Results  Component Value Date   HDL 50 01/16/2021   Lab Results  Component Value Date   LDLCALC 103 (H) 01/16/2021   Lab Results  Component Value Date   TRIG 124 01/16/2021   Lab Results  Component Value Date   CHOLHDL 3.5 01/16/2021   Lab Results  Component Value Date   HGBA1C 4.7 01/16/2022      Assessment & Plan:   Problem List Items Addressed This Visit       Cardiovascular and Mediastinum   Essential hypertension    Patient BP  Vitals:   04/11/22 1019 04/11/22  1021  BP: (!) 145/71 (!) 152/68    in the office today. Advised pt to follow a low sodium and heart healthy diet. Continue losartan HCTZ and atenolol.           Other   Fatigue - Primary   Relevant Orders   CBC with Differential/Platelet   COMPLETE METABOLIC PANEL WITH GFR   TSH     No orders of the defined types were placed in this encounter.    Follow-up: No follow-ups on file.    Theresia Lo, NP

## 2022-04-12 LAB — COMPLETE METABOLIC PANEL WITH GFR
AG Ratio: 1.3 (calc) (ref 1.0–2.5)
ALT: 15 U/L (ref 6–29)
AST: 18 U/L (ref 10–35)
Albumin: 4 g/dL (ref 3.6–5.1)
Alkaline phosphatase (APISO): 84 U/L (ref 37–153)
BUN: 25 mg/dL (ref 7–25)
CO2: 30 mmol/L (ref 20–32)
Calcium: 10 mg/dL (ref 8.6–10.4)
Chloride: 103 mmol/L (ref 98–110)
Creat: 0.94 mg/dL (ref 0.60–0.95)
Globulin: 3.1 g/dL (calc) (ref 1.9–3.7)
Glucose, Bld: 162 mg/dL — ABNORMAL HIGH (ref 65–99)
Potassium: 4.3 mmol/L (ref 3.5–5.3)
Sodium: 141 mmol/L (ref 135–146)
Total Bilirubin: 0.5 mg/dL (ref 0.2–1.2)
Total Protein: 7.1 g/dL (ref 6.1–8.1)
eGFR: 59 mL/min/{1.73_m2} — ABNORMAL LOW (ref >=60)

## 2022-04-12 LAB — CBC WITH DIFFERENTIAL/PLATELET
Absolute Monocytes: 464 cells/uL (ref 200–950)
Basophils Absolute: 49 cells/uL (ref 0–200)
Basophils Relative: 0.8 %
Eosinophils Absolute: 61 cells/uL (ref 15–500)
Eosinophils Relative: 1 %
HCT: 35.6 % (ref 35.0–45.0)
Hemoglobin: 11.6 g/dL — ABNORMAL LOW (ref 11.7–15.5)
Lymphs Abs: 1409 cells/uL (ref 850–3900)
MCH: 31.5 pg (ref 27.0–33.0)
MCHC: 32.6 g/dL (ref 32.0–36.0)
MCV: 96.7 fL (ref 80.0–100.0)
MPV: 13.1 fL — ABNORMAL HIGH (ref 7.5–12.5)
Monocytes Relative: 7.6 %
Neutro Abs: 4118 cells/uL (ref 1500–7800)
Neutrophils Relative %: 67.5 %
Platelets: 240 10*3/uL (ref 140–400)
RBC: 3.68 10*6/uL — ABNORMAL LOW (ref 3.80–5.10)
RDW: 11.8 % (ref 11.0–15.0)
Total Lymphocyte: 23.1 %
WBC: 6.1 10*3/uL (ref 3.8–10.8)

## 2022-04-12 LAB — TSH: TSH: 1.03 mIU/L (ref 0.40–4.50)

## 2022-04-16 ENCOUNTER — Other Ambulatory Visit: Payer: Self-pay | Admitting: Internal Medicine

## 2022-04-16 DIAGNOSIS — E119 Type 2 diabetes mellitus without complications: Secondary | ICD-10-CM

## 2022-04-17 ENCOUNTER — Encounter: Payer: Self-pay | Admitting: Nurse Practitioner

## 2022-04-17 NOTE — Assessment & Plan Note (Signed)
Body mass index is 40.32 kg/m. Advised pt to lose weight. Advised patient to avoid trans fat, fatty and fried food. Follow a regular physical activity schedule. Went over the risk of chronic diseases with increased weight.

## 2022-04-17 NOTE — Progress Notes (Signed)
Established Patient Office Visit  Subjective:  Patient ID: Vanessa Vazquez, female    DOB: [birth year]  Age: 87 y.o. MRN: [id number]  CC:  Chief Complaint  Patient presents with   Hypertension    1 week follow up     HPI  Vanessa Vazquez presents for follow-up on blood pressure.She is doing fine today. Denies weakness, fatigue or chest pain or SOB.   HPI   Past Medical History:  Diagnosis Date   Asthma    Diabetes mellitus without complication ([place])    GERD (gastroesophageal reflux disease)    Hypercholesteremia    Hypertension     Past Surgical History:  Procedure Laterality Date   CESAREAN SECTION     COLONOSCOPY WITH PROPOFOL N/A 03/19/2016   Procedure: COLONOSCOPY WITH PROPOFOL;  Surgeon: Manya Silvas, MD;  Location: [facility];  Service: Endoscopy;  Laterality: N/A;    Family History  Problem Relation Age of Onset   Lung cancer Father    Leukemia Sister    Diabetes Sister    Asthma Child     Social History   Socioeconomic History   Marital status: Widowed    Spouse name: Not on file   Number of children: Not on file   Years of education: Not on file   Highest education level: GED or equivalent  Occupational History   Not on file  Tobacco Use   Smoking status: Former   Smokeless tobacco: Never  Substance and Sexual Activity   Alcohol use: No    Alcohol/week: 0.0 standard drinks of alcohol   Drug use: No   Sexual activity: Never  Other Topics Concern   Not on file  Social History Narrative   Not on file   Social Determinants of Health   Financial Resource Strain: Low Risk  (05/11/2021)   Overall Financial Resource Strain (CARDIA)    Difficulty of Paying Living Expenses: Not hard at all  Food Insecurity: No Food Insecurity (05/11/2021)   Hunger Vital Sign    Worried About Running Out of Food in the Last Year: Never true    [place] in the Last Year: Never true  Transportation Needs: No Transportation Needs (05/11/2021)   PRAPARE  - Hydrologist (Medical): No    Lack of Transportation (Non-Medical): No  Physical Activity: Insufficiently Active (05/11/2021)   Exercise Vital Sign    Days of Exercise per Week: 5 days    Minutes of Exercise per Session: 10 min  Stress: No Stress Concern Present (05/11/2021)   [place]    Feeling of Stress : Not at all  Social Connections: Moderately Isolated (05/11/2021)   Social Connection and Isolation Panel [NHANES]    Frequency of Communication with Friends and Family: More than three times a week    Frequency of Social Gatherings with Friends and Family: More than three times a week    Attends Religious Services: More than 4 times per year    Active Member of [employer] or Organizations: No    Attends Archivist Meetings: Never    Marital Status: Widowed  Intimate Partner Violence: Not At Risk (05/11/2021)   Humiliation, Afraid, Rape, and Kick questionnaire    Fear of Current or Ex-Partner: No    Emotionally Abused: No    Physically Abused: No    Sexually Abused: No     Outpatient Medications Prior to Visit  Medication  Sig Dispense Refill   albuterol (VENTOLIN HFA) 108 (90 Base) MCG/ACT inhaler Inhale 2 puffs into the lungs every 4 (four) hours as needed for wheezing or shortness of breath. 18 g 6   atenolol (TENORMIN) 25 MG tablet TAKE 1 TABLET BY MOUTH ONCE DAILY 30 tablet 6   atorvastatin (LIPITOR) 40 MG tablet Take 1 tablet (40 mg total) by mouth daily. 90 tablet 3   furosemide (LASIX) 20 MG tablet TAKE 1 TABLET BY MOUTH EVERY OTHER DAY 30 tablet 3   glimepiride (AMARYL) 2 MG tablet TAKE 1 TABLET BY MOUTH TWICE DAILY 180 tablet 3   JANUVIA 100 MG tablet TAKE 1 TABLET BY MOUTH ONCE DAILY 90 tablet 3   loratadine (CLARITIN) 10 MG tablet Take 1 tablet (10 mg total) by mouth daily. 90 tablet 3   losartan-hydrochlorothiazide (HYZAAR) 50-12.5 MG tablet TAKE 2 TABLETS BY MOUTH  ONCE DAILY 180 tablet 2   metFORMIN (GLUCOPHAGE) 1000 MG tablet Take 1 tablet (1,000 mg total) by mouth 2 (two) times daily. 180 tablet 3   Misc. Devices (WALKER) MISC 1 each by Does not apply route daily. 1 each 0   omeprazole (PRILOSEC) 20 MG capsule TAKE 1 CAPSULE BY MOUTH ONCE DAILY 30 capsule 3   pregabalin (LYRICA) 50 MG capsule TAKE 1 CAPSULE BY MOUTH TWICE DAILY 60 capsule 4   Respiratory Therapy Supplies (NEBULIZER) DEVI 1 Device by Does not apply route 2 (two) times daily as needed. 1 each 0   ferrous sulfate 300 (60 Fe) MG/5ML syrup Take 5 mLs (300 mg total) by mouth daily. 150 mL 3   No facility-administered medications prior to visit.    No Known Allergies  ROS Review of Systems  Constitutional: Negative.   HENT: Negative.    Eyes: Negative.   Respiratory:  Negative for chest tightness and shortness of breath.   Cardiovascular:  Negative for chest pain and palpitations.  Gastrointestinal: Negative.   Genitourinary: Negative.   Musculoskeletal: Negative.   Neurological: Negative.   Psychiatric/Behavioral:  Negative for agitation, behavioral problems and confusion.       Objective:    Physical Exam Constitutional:      Appearance: Normal appearance. She is obese.  HENT:     Right Ear: Tympanic membrane normal.     Left Ear: Tympanic membrane normal.     Nose: Nose normal.     Mouth/Throat:     Mouth: Mucous membranes are moist.  Eyes:     Extraocular Movements: Extraocular movements intact.     Conjunctiva/sclera: Conjunctivae normal.     Pupils: Pupils are equal, round, and reactive to light.  Cardiovascular:     Rate and Rhythm: Normal rate and regular rhythm.  Pulmonary:     Effort: Pulmonary effort is normal.     Breath sounds: Normal breath sounds.  Abdominal:     General: Bowel sounds are normal.     Palpations: Abdomen is soft.  Neurological:     General: No focal deficit present.     Mental Status: She is alert and oriented to person, place, and  time. Mental status is at baseline.  Psychiatric:        Mood and Affect: Mood normal.        Thought Content: Thought content normal.        Judgment: Judgment normal.     BP 132/68   Pulse 69   Temp (!) 97.2 F (36.2 C) (Temporal)   Ht _0  (1.6 m)  Wt 232 lb 12.8 oz (105.6 kg)   SpO2 95%   BMI 41.24 kg/m  Wt Readings from Last 3 Encounters:  04/18/22 232 lb 12.8 oz (105.6 kg)  04/11/22 234 lb 14.4 oz (106.5 kg)  02/14/22 231 lb 14.4 oz (105.2 kg)     Health Maintenance  Topic Date Due   FOOT EXAM  Never done   Diabetic kidney evaluation - Urine ACR  09/15/2016   Medicare Annual Wellness (AWV)  05/11/2022   Pneumonia Vaccine 87+ Years old (2 - PCV) 04/18/2022 (Originally 04/11/2004)   COVID-19 Vaccine (1) 05/04/2022 (Originally [birth year]/1942)   Zoster Vaccines- Shingrix (1 of 2) 05/16/2022 (Originally 03/12/1956)   HEMOGLOBIN A1C  07/19/2022   OPHTHALMOLOGY EXAM  11/16/2022   Diabetic kidney evaluation - GFR measurement  04/12/2023   DEXA SCAN  Completed   HPV VACCINES  Aged Out   INFLUENZA VACCINE  Discontinued    There are no preventive care reminders to display for this patient.  Lab Results  Component Value Date   TSH 1.03 04/11/2022   Lab Results  Component Value Date   WBC 6.1 04/11/2022   HGB 11.6 (L) 04/11/2022   HCT 35.6 04/11/2022   MCV 96.7 04/11/2022   PLT 240 04/11/2022   Lab Results  Component Value Date   NA 141 04/11/2022   K 4.3 04/11/2022   CO2 30 04/11/2022   GLUCOSE 162 (H) 04/11/2022   BUN 25 04/11/2022   CREATININE 0.94 04/11/2022   BILITOT 0.5 04/11/2022   ALKPHOS 66 11/21/2021   AST 18 04/11/2022   ALT 15 04/11/2022   PROT 7.1 04/11/2022   ALBUMIN 3.4 (L) 11/21/2021   CALCIUM 10.0 04/11/2022   ANIONGAP 7 01/01/2022   EGFR 59 (L) 04/11/2022   Lab Results  Component Value Date   CHOL 176 01/16/2021   Lab Results  Component Value Date   HDL 50 01/16/2021   Lab Results  Component Value Date   LDLCALC 103 (H)  01/16/2021   Lab Results  Component Value Date   TRIG 124 01/16/2021   Lab Results  Component Value Date   CHOLHDL 3.5 01/16/2021   Lab Results  Component Value Date   HGBA1C 4.7 01/16/2022      Assessment & Plan:   Problem List Items Addressed This Visit       Cardiovascular and Mediastinum   Essential hypertension - Primary     No orders of the defined types were placed in this encounter.    Follow-up: No follow-ups on file.    Theresia Lo, NP

## 2022-04-17 NOTE — Assessment & Plan Note (Signed)
Labs ordered CBC, metabolic panel and TSH.

## 2022-04-18 ENCOUNTER — Encounter: Payer: Self-pay | Admitting: Nurse Practitioner

## 2022-04-18 ENCOUNTER — Ambulatory Visit (INDEPENDENT_AMBULATORY_CARE_PROVIDER_SITE_OTHER): Payer: Medicare Other | Admitting: Nurse Practitioner

## 2022-04-18 VITALS — BP 132/68 | HR 69 | Temp 97.2°F | Ht 63.0 in | Wt 232.8 lb

## 2022-04-18 DIAGNOSIS — I10 Essential (primary) hypertension: Secondary | ICD-10-CM

## 2022-04-18 DIAGNOSIS — E119 Type 2 diabetes mellitus without complications: Secondary | ICD-10-CM

## 2022-04-19 LAB — MICROALBUMIN, URINE: Microalb, Ur: 0.9 mg/dL

## 2022-05-08 ENCOUNTER — Ambulatory Visit (INDEPENDENT_AMBULATORY_CARE_PROVIDER_SITE_OTHER): Payer: Medicaid Other | Admitting: Podiatry

## 2022-05-08 DIAGNOSIS — M79674 Pain in right toe(s): Secondary | ICD-10-CM

## 2022-05-08 DIAGNOSIS — M79675 Pain in left toe(s): Secondary | ICD-10-CM

## 2022-05-08 DIAGNOSIS — B351 Tinea unguium: Secondary | ICD-10-CM

## 2022-05-08 NOTE — Progress Notes (Signed)
   Chief Complaint  Patient presents with   foot care    Patient is her for diabetic foot care.    SUBJECTIVE Patient with a history of diabetes mellitus presents to office today complaining of elongated, thickened nails that cause pain while ambulating in shoes.  Patient is unable to trim their own nails. Patient is here for further evaluation and treatment.  Past Medical History:  Diagnosis Date   Asthma    Diabetes mellitus without complication ([place])    GERD (gastroesophageal reflux disease)    Hypercholesteremia    Hypertension     No Known Allergies   OBJECTIVE General Patient is awake, alert, and oriented x 3 and in no acute distress. Derm Skin is dry and supple bilateral. Negative open lesions or macerations. Remaining integument unremarkable. Nails are tender, long, thickened and dystrophic with subungual debris, consistent with onychomycosis, 1-5 bilateral. No signs of infection noted. Vasc  DP and PT pedal pulses palpable bilaterally. Temperature gradient within normal limits.  Neuro Epicritic and protective threshold sensation diminished bilaterally.  Musculoskeletal Exam No symptomatic pedal deformities noted bilateral. Muscular strength within normal limits.  ASSESSMENT 1. Diabetes Mellitus w/ peripheral neuropathy 2.  Pain due to onychomycosis of toenails bilateral  PLAN OF CARE 1. Patient evaluated today. 2. Instructed to maintain good pedal hygiene and foot care. Stressed importance of controlling blood sugar.  3. Mechanical debridement of nails 1-5 bilaterally performed using a nail nipper. Filed with dremel without incident.  4. Return to clinic in 3 mos.     Edrick Kins, DPM [facility]  Dr. Edrick Kins, DPM    [address], [place] [address]                Office [phone]  Fax [phone]

## 2022-05-10 NOTE — Assessment & Plan Note (Signed)
Patient BP  Vitals:   04/18/22 0844  BP: 132/68    in the office today. Encourage patient to follow low-salt heart healthy diet. Continue losartan HCTZ and atenolol. Continue atorvastatin for cardiovascular risk reduction.

## 2022-05-14 ENCOUNTER — Encounter: Payer: Medicare Other | Admitting: Internal Medicine

## 2022-05-14 ENCOUNTER — Ambulatory Visit (INDEPENDENT_AMBULATORY_CARE_PROVIDER_SITE_OTHER): Payer: Medicare Other

## 2022-05-14 VITALS — Ht 63.0 in | Wt 232.0 lb

## 2022-05-14 DIAGNOSIS — M1990 Unspecified osteoarthritis, unspecified site: Secondary | ICD-10-CM | POA: Insufficient documentation

## 2022-05-14 DIAGNOSIS — Z0000 Encounter for general adult medical examination without abnormal findings: Secondary | ICD-10-CM | POA: Diagnosis not present

## 2022-05-14 DIAGNOSIS — E119 Type 2 diabetes mellitus without complications: Secondary | ICD-10-CM | POA: Insufficient documentation

## 2022-05-14 DIAGNOSIS — E785 Hyperlipidemia, unspecified: Secondary | ICD-10-CM | POA: Insufficient documentation

## 2022-05-14 NOTE — Progress Notes (Signed)
Virtual Visit via Telephone Note  I connected with  Vanessa Vazquez on 05/14/22 at  9:45 AM EST by telephone and verified that I am speaking with the correct person using two identifiers.  Location: Patient: home Provider: [facility] Persons participating in the virtual visit: patient/Nurse Health Advisor   I discussed the limitations, risks, security and privacy concerns of performing an evaluation and management service by telephone and the availability of in person appointments. The patient expressed understanding and agreed to proceed.  Interactive audio and video telecommunications were attempted between this nurse and patient, however failed, due to patient having technical difficulties OR patient did not have access to video capability.  We continued and completed visit with audio only.  Some vital signs may be absent or patient reported.   Dionisio David, LPN  Subjective:   Vanessa Vazquez is a 87 y.o. female who presents for Medicare Annual (Subsequent) preventive examination.  Review of Systems     Cardiac Risk Factors include: advanced age (>41mn, >>73women);diabetes mellitus;hypertension;sedentary lifestyle     Objective:    There were no vitals filed for this visit. There is no height or weight on file to calculate BMI.     05/14/2022    9:49 AM 01/01/2022   11:22 AM 11/21/2021    8:37 AM 05/11/2021   10:09 AM 10/19/2020   10:38 AM 10/02/2020   11:09 AM 09/29/2019   10:19 AM  Advanced Directives  Does Patient Have a Medical Advance Directive? _0  No No  Would patient like information on creating a medical advance directive? No - Patient declined No - Patient declined No - Patient declined No - Patient declined  No - Patient declined No - Patient declined    Current Medications (verified) Outpatient Encounter Medications as of 05/14/2022  Medication Sig   albuterol (VENTOLIN HFA) 108 (90 Base) MCG/ACT inhaler Inhale 2 puffs into the lungs every 4 (four) hours as  needed for wheezing or shortness of breath.   atenolol (TENORMIN) 25 MG tablet TAKE 1 TABLET BY MOUTH ONCE DAILY   atorvastatin (LIPITOR) 40 MG tablet Take 1 tablet (40 mg total) by mouth daily.   furosemide (LASIX) 20 MG tablet TAKE 1 TABLET BY MOUTH EVERY OTHER DAY   glimepiride (AMARYL) 2 MG tablet TAKE 1 TABLET BY MOUTH TWICE DAILY   JANUVIA 100 MG tablet TAKE 1 TABLET BY MOUTH ONCE DAILY   loratadine (CLARITIN) 10 MG tablet Take 1 tablet (10 mg total) by mouth daily.   losartan-hydrochlorothiazide (HYZAAR) 50-12.5 MG tablet TAKE 2 TABLETS BY MOUTH ONCE DAILY   metFORMIN (GLUCOPHAGE) 1000 MG tablet TAKE 1 TABLET BY MOUTH TWICE DAILY   Misc. Devices (WALKER) MISC 1 each by Does not apply route daily.   pregabalin (LYRICA) 50 MG capsule TAKE 1 CAPSULE BY MOUTH TWICE DAILY   Respiratory Therapy Supplies (NEBULIZER) DEVI 1 Device by Does not apply route 2 (two) times daily as needed.   ferrous sulfate 300 (60 Fe) MG/5ML syrup Take 5 mLs (300 mg total) by mouth daily.   omeprazole (PRILOSEC) 20 MG capsule TAKE 1 CAPSULE BY MOUTH ONCE DAILY (Patient not taking: Reported on 05/14/2022)   No facility-administered encounter medications on file as of 05/14/2022.    Allergies (verified) Patient has no known allergies.   History: Past Medical History:  Diagnosis Date   Asthma    Diabetes mellitus without complication (HCC)    GERD (gastroesophageal reflux disease)    Hypercholesteremia  Hypertension    Past Surgical History:  Procedure Laterality Date   CESAREAN SECTION     COLONOSCOPY WITH PROPOFOL N/A 03/19/2016   Procedure: COLONOSCOPY WITH PROPOFOL;  Surgeon: Manya Silvas, MD;  Location: [facility] ENDOSCOPY;  Service: Endoscopy;  Laterality: N/A;   Family History  Problem Relation Age of Onset   Lung cancer Father    Leukemia Sister    Diabetes Sister    Asthma Child    Social History   Socioeconomic History   Marital status: Widowed    Spouse name: Not on file   Number  of children: Not on file   Years of education: Not on file   Highest education level: GED or equivalent  Occupational History   Not on file  Tobacco Use   Smoking status: Former   Smokeless tobacco: Never  Substance and Sexual Activity   Alcohol use: No    Alcohol/week: 0.0 standard drinks of alcohol   Drug use: No   Sexual activity: Never  Other Topics Concern   Not on file  Social History Narrative   Not on file   Social Determinants of Health   Financial Resource Strain: Low Risk  (05/14/2022)   Overall Financial Resource Strain (CARDIA)    Difficulty of Paying Living Expenses: Not hard at all  Food Insecurity: No Food Insecurity (05/14/2022)   Hunger Vital Sign    Worried About Running Out of Food in the Last Year: Never true    [place] in the Last Year: Never true  Transportation Needs: No Transportation Needs (05/14/2022)   PRAPARE - Hydrologist (Medical): No    Lack of Transportation (Non-Medical): No  Physical Activity: Inactive (05/14/2022)   Exercise Vital Sign    Days of Exercise per Week: 0 days    Minutes of Exercise per Session: 0 min  Stress: No Stress Concern Present (05/14/2022)   [place]    Feeling of Stress : Not at all  Social Connections: Socially Isolated (05/14/2022)   Social Connection and Isolation Panel [NHANES]    Frequency of Communication with Friends and Family: Twice a week    Frequency of Social Gatherings with Friends and Family: Never    Attends Religious Services: More than 4 times per year    Active Member of [employer] or Organizations: No    Attends Archivist Meetings: Never    Marital Status: Widowed    Tobacco Counseling Counseling given: Not Answered   Clinical Intake:  Pre-visit preparation completed: Yes  Pain : No/denies pain     Nutritional Risks: None Diabetes: Yes CBG done?: No Did pt. bring in CBG  monitor from home?: No  How often do you need to have someone help you when you read instructions, pamphlets, or other written materials from your doctor or pharmacy?: 1 - Never  Diabetic?yes Nutrition Risk Assessment:  Has the patient had any N/V/D within the last 2 months?  No  Does the patient have any non-healing wounds?  No  Has the patient had any unintentional weight loss or weight gain?  No   Diabetes:  Is the patient diabetic?  Yes  If diabetic, was a CBG obtained today?  No  Did the patient bring in their glucometer from home?  No  How often do you monitor your CBG's? occasionally.   Financial Strains and Diabetes Management:  Are you having any financial strains with the  device, your supplies or your medication? No .  Does the patient want to be seen by Chronic Care Management for management of their diabetes?  No  Would the patient like to be referred to a Nutritionist or for Diabetic Management?  No   Diabetic Exams:  Diabetic Eye Exam: Completed 10/26/21. Marland Kitchen Pt has been advised about the importance in completing this exam.   Diabetic Foot Exam: Completed no . Pt has been advised about the importance in completing this exam.   Interpreter Needed?: No  Information entered by :: Kirke Shaggy, LPN   Activities of Daily Living    05/14/2022    9:50 AM  In your present state of health, do you have any difficulty performing the following activities:  Hearing? 1  Vision? 0  Difficulty concentrating or making decisions? 0  Walking or climbing stairs? 1  Dressing or bathing? 0  Doing errands, shopping? 0  Preparing Food and eating ? N  Using the Toilet? N  In the past six months, have you accidently leaked urine? N  Do you have problems with loss of bowel control? N  Managing your Medications? N  Managing your Finances? N  Housekeeping or managing your Housekeeping? N    Patient Care Team: Cletis Athens, MD as PCP - General (Internal Medicine)  Indicate any  recent Medical Services you may have received from other than [facility] providers in the past year (date may be approximate).     Assessment:   This is a routine wellness examination for [place].  Hearing/Vision screen Hearing Screening - Comments:: Wears aids Vision Screening - Comments:: Wears glasses- [facility]  Dietary issues and exercise activities discussed: Current Exercise Habits: The patient does not participate in regular exercise at present   Goals Addressed             This Visit's Progress    DIET - EAT MORE FRUITS AND VEGETABLES         Depression Screen    05/14/2022    9:48 AM 07/31/2021   10:02 AM 05/11/2021   10:07 AM 04/18/2021    8:57 AM 01/16/2021   10:37 AM 01/06/2020    9:11 AM  PHQ 2/9 Scores  PHQ - 2 Score 0 0 0 0 0 0  PHQ- 9 Score 0         Fall Risk    05/14/2022    9:50 AM 07/31/2021   10:02 AM 04/18/2021    8:57 AM 01/16/2021   10:35 AM 01/06/2020    9:11 AM  Fall Risk   Falls in the past year? 0  0 1 0  Number falls in past yr: 0 0 0 0 0  Injury with Fall? 0 0 0 0 0  Risk for fall due to : History of fall(s) No Fall Risks No Fall Risks History of fall(s)   Follow up Falls prevention discussed;Falls evaluation completed Falls evaluation completed Falls evaluation completed Falls evaluation completed     FALL RISK PREVENTION PERTAINING TO THE HOME:  Any stairs in or around the home? No  If so, are there any without handrails? No  Home free of loose throw rugs in walkways, pet beds, electrical cords, etc? Yes  Adequate lighting in your home to reduce risk of falls? Yes   ASSISTIVE DEVICES UTILIZED TO PREVENT FALLS:  Life alert? No  Use of a cane, walker or w/c? Yes  Grab bars in the bathroom? No  Shower chair or bench in shower?  Yes  Elevated toilet seat or a handicapped toilet? No   Cognitive Function:    05/11/2021   10:09 AM  MMSE - Mini Mental State Exam  Not completed: Unable to complete        05/14/2022    9:54 AM  05/11/2021   10:09 AM  6CIT Screen  What Year? 0 points 0 points  What month? 0 points 0 points  What time? 0 points 0 points  Count back from 20 0 points 0 points  Months in reverse 0 points 0 points  Repeat phrase 2 points 0 points  Total Score 2 points 0 points    Immunizations Immunization History  Administered Date(s) Administered   Influenza, Seasonal, Injecte, Preservative Fre 04/23/2006, 03/25/2007, 03/10/2009, 03/07/2010   Pneumococcal Polysaccharide-23 04/12/2003    TDAP status: Due, Education has been provided regarding the importance of this vaccine. Advised may receive this vaccine at local pharmacy or Health Dept. Aware to provide a copy of the vaccination record if obtained from local pharmacy or Health Dept. Verbalized acceptance and understanding.  Flu Vaccine status: Due, Education has been provided regarding the importance of this vaccine. Advised may receive this vaccine at local pharmacy or Health Dept. Aware to provide a copy of the vaccination record if obtained from local pharmacy or Health Dept. Verbalized acceptance and understanding.  Pneumococcal vaccine status: Up to date  Covid-19 vaccine status: Declined, Education has been provided regarding the importance of this vaccine but patient still declined. Advised may receive this vaccine at local pharmacy or Health Dept.or vaccine clinic. Aware to provide a copy of the vaccination record if obtained from local pharmacy or Health Dept. Verbalized acceptance and understanding.  Qualifies for Shingles Vaccine? Yes   Zostavax completed No   Shingrix Completed?: No.    Education has been provided regarding the importance of this vaccine. Patient has been advised to call insurance company to determine out of pocket expense if they have not yet received this vaccine. Advised may also receive vaccine at local pharmacy or Health Dept. Verbalized acceptance and understanding.  Screening Tests Health Maintenance  Topic  Date Due   COVID-19 Vaccine (1) Never done   FOOT EXAM  Never done   Diabetic kidney evaluation - Urine ACR  Never done   DTaP/Tdap/Td (1 - Tdap) Never done   Pneumonia Vaccine 79+ Years old (2 - PCV) 04/11/2004   Zoster Vaccines- Shingrix (1 of 2) 05/16/2022 (Originally 03/12/1954)   HEMOGLOBIN A1C  07/19/2022   OPHTHALMOLOGY EXAM  11/16/2022   Diabetic kidney evaluation - eGFR measurement  04/12/2023   Medicare Annual Wellness (AWV)  05/15/2023   DEXA SCAN  Completed   HPV VACCINES  Aged Out   INFLUENZA VACCINE  Discontinued    Health Maintenance  Health Maintenance Due  Topic Date Due   COVID-19 Vaccine (1) Never done   FOOT EXAM  Never done   Diabetic kidney evaluation - Urine ACR  Never done   DTaP/Tdap/Td (1 - Tdap) Never done   Pneumonia Vaccine 91+ Years old (2 - PCV) 04/11/2004    Colorectal cancer screening: No longer required.   Mammogram status: No longer required due to age.  Lung Cancer Screening: (Low Dose CT Chest recommended if Age 75-80 years, 30 pack-year currently smoking OR have quit w/in 15years.) does not qualify.   Additional Screening:  Hepatitis C Screening: does not qualify; Completed no  Vision Screening: Recommended annual ophthalmology exams for early detection of glaucoma and other disorders of  the eye. Is the patient up to date with their annual eye exam?  Yes  Who is the provider or what is the name of the office in which the patient attends annual eye exams? [place] If pt is not established with a provider, would they like to be referred to a provider to establish care? No .   Dental Screening: Recommended annual dental exams for proper oral hygiene  Community Resource Referral / Chronic Care Management: CRR required this visit?  No   CCM required this visit?  No      Plan:     I have personally reviewed and noted the following in the patient's chart:   Medical and social history Use of alcohol, tobacco or illicit drugs   Current medications and supplements including opioid prescriptions. Patient is not currently taking opioid prescriptions. Functional ability and status Nutritional status Physical activity Advanced directives List of other physicians Hospitalizations, surgeries, and ER visits in previous 12 months Vitals Screenings to include cognitive, depression, and falls Referrals and appointments  In addition, I have reviewed and discussed with patient certain preventive protocols, quality metrics, and best practice recommendations. A written personalized care plan for preventive services as well as general preventive health recommendations were provided to patient.     Dionisio David, LPN   [id number]   Nurse Notes: none

## 2022-05-14 NOTE — Patient Instructions (Signed)
Vanessa Vazquez , Thank you for taking time to come for your Medicare Wellness Visit. I appreciate your ongoing commitment to your health goals. Please review the following plan we discussed and let me know if I can assist you in the future.   Screening recommendations/referrals: Colonoscopy: aged out Mammogram: aged out Bone Density: aged out Recommended yearly ophthalmology/optometry visit for glaucoma screening and checkup Recommended yearly dental visit for hygiene and checkup  Vaccinations: Influenza vaccine: n/d Pneumococcal vaccine: 04/12/03 Tdap vaccine: n/d Shingles vaccine: n/d   Covid-19:n/d  Advanced directives: no  Conditions/risks identified: none  Next appointment: Follow up in one year for your annual wellness visit    Preventive Care 23 Years and Older, Female Preventive care refers to lifestyle choices and visits with your health care provider that can promote health and wellness. What does preventive care include? A yearly physical exam. This is also called an annual well check. Dental exams once or twice a year. Routine eye exams. Ask your health care provider how often you should have your eyes checked. Personal lifestyle choices, including: Daily care of your teeth and gums. Regular physical activity. Eating a healthy diet. Avoiding tobacco and drug use. Limiting alcohol use. Practicing safe sex. Taking low-dose aspirin every day. Taking vitamin and mineral supplements as recommended by your health care provider. What happens during an annual well check? The services and screenings done by your health care provider during your annual well check will depend on your age, overall health, lifestyle risk factors, and family history of disease. Counseling  Your health care provider may ask you questions about your: Alcohol use. Tobacco use. Drug use. Emotional well-being. Home and relationship well-being. Sexual activity. Eating habits. History of  falls. Memory and ability to understand (cognition). Work and work Statistician. Reproductive health. Screening  You may have the following tests or measurements: Height, weight, and BMI. Blood pressure. Lipid and cholesterol levels. These may be checked every 5 years, or more frequently if you are over 42 years old. Skin check. Lung cancer screening. You may have this screening every year starting at age 52 if you have a 30-pack-year history of smoking and currently smoke or have quit within the past 15 years. Fecal occult blood test (FOBT) of the stool. You may have this test every year starting at age 39. Flexible sigmoidoscopy or colonoscopy. You may have a sigmoidoscopy every 5 years or a colonoscopy every 10 years starting at age 56. Hepatitis C blood test. Hepatitis B blood test. Sexually transmitted disease (STD) testing. Diabetes screening. This is done by checking your blood sugar (glucose) after you have not eaten for a while (fasting). You may have this done every 1-3 years. Bone density scan. This is done to screen for osteoporosis. You may have this done starting at age 17. Mammogram. This may be done every 1-2 years. Talk to your health care provider about how often you should have regular mammograms. Talk with your health care provider about your test results, treatment options, and if necessary, the need for more tests. Vaccines  Your health care provider may recommend certain vaccines, such as: Influenza vaccine. This is recommended every year. Tetanus, diphtheria, and acellular pertussis (Tdap, Td) vaccine. You may need a Td booster every 10 years. Zoster vaccine. You may need this after age 44. Pneumococcal 13-valent conjugate (PCV13) vaccine. One dose is recommended after age 58. Pneumococcal polysaccharide (PPSV23) vaccine. One dose is recommended after age 62. Talk to your health care provider about which screenings  and vaccines you need and how often you need  them. This information is not intended to replace advice given to you by your health care provider. Make sure you discuss any questions you have with your health care provider. Document Released: 06/10/2015 Document Revised: 02/01/2016 Document Reviewed: 03/15/2015 Elsevier Interactive Patient Education  2017 [employer] Prevention in the Home Falls can cause injuries. They can happen to people of all ages. There are many things you can do to make your home safe and to help prevent falls. What can I do on the outside of my home? Regularly fix the edges of walkways and driveways and fix any cracks. Remove anything that might make you trip as you walk through a door, such as a raised step or threshold. Trim any bushes or trees on the path to your home. Use bright outdoor lighting. Clear any walking paths of anything that might make someone trip, such as rocks or tools. Regularly check to see if handrails are loose or broken. Make sure that both sides of any steps have handrails. Any raised decks and porches should have guardrails on the edges. Have any leaves, snow, or ice cleared regularly. Use sand or salt on walking paths during winter. Clean up any spills in your garage right away. This includes oil or grease spills. What can I do in the bathroom? Use night lights. Install grab bars by the toilet and in the tub and shower. Do not use towel bars as grab bars. Use non-skid mats or decals in the tub or shower. If you need to sit down in the shower, use a plastic, non-slip stool. Keep the floor dry. Clean up any water that spills on the floor as soon as it happens. Remove soap buildup in the tub or shower regularly. Attach bath mats securely with double-sided non-slip rug tape. Do not have throw rugs and other things on the floor that can make you trip. What can I do in the bedroom? Use night lights. Make sure that you have a light by your bed that is easy to reach. Do not use  any sheets or blankets that are too big for your bed. They should not hang down onto the floor. Have a firm chair that has side arms. You can use this for support while you get dressed. Do not have throw rugs and other things on the floor that can make you trip. What can I do in the kitchen? Clean up any spills right away. Avoid walking on wet floors. Keep items that you use a lot in easy-to-reach places. If you need to reach something above you, use a strong step stool that has a grab bar. Keep electrical cords out of the way. Do not use floor polish or wax that makes floors slippery. If you must use wax, use non-skid floor wax. Do not have throw rugs and other things on the floor that can make you trip. What can I do with my stairs? Do not leave any items on the stairs. Make sure that there are handrails on both sides of the stairs and use them. Fix handrails that are broken or loose. Make sure that handrails are as long as the stairways. Check any carpeting to make sure that it is firmly attached to the stairs. Fix any carpet that is loose or worn. Avoid having throw rugs at the top or bottom of the stairs. If you do have throw rugs, attach them to the floor with carpet tape.  Make sure that you have a light switch at the top of the stairs and the bottom of the stairs. If you do not have them, ask someone to add them for you. What else can I do to help prevent falls? Wear shoes that: Do not have high heels. Have rubber bottoms. Are comfortable and fit you well. Are closed at the toe. Do not wear sandals. If you use a stepladder: Make sure that it is fully opened. Do not climb a closed stepladder. Make sure that both sides of the stepladder are locked into place. Ask someone to hold it for you, if possible. Clearly mark and make sure that you can see: Any grab bars or handrails. First and last steps. Where the edge of each step is. Use tools that help you move around (mobility aids)  if they are needed. These include: Canes. Walkers. Scooters. Crutches. Turn on the lights when you go into a dark area. Replace any light bulbs as soon as they burn out. Set up your furniture so you have a clear path. Avoid moving your furniture around. If any of your floors are uneven, fix them. If there are any pets around you, be aware of where they are. Review your medicines with your doctor. Some medicines can make you feel dizzy. This can increase your chance of falling. Ask your doctor what other things that you can do to help prevent falls. This information is not intended to replace advice given to you by your health care provider. Make sure you discuss any questions you have with your health care provider. Document Released: 03/10/2009 Document Revised: 10/20/2015 Document Reviewed: 06/18/2014 Elsevier Interactive Patient Education  2017 [employer].

## 2022-05-19 ENCOUNTER — Other Ambulatory Visit: Payer: Self-pay

## 2022-05-19 ENCOUNTER — Emergency Department: Payer: Medicare Other

## 2022-05-19 ENCOUNTER — Encounter: Payer: Self-pay | Admitting: Emergency Medicine

## 2022-05-19 ENCOUNTER — Emergency Department
Admission: EM | Admit: 2022-05-19 | Discharge: 2022-05-19 | Disposition: A | Discharge status: Home / Self Care | Payer: Medicare Other | Attending: Emergency Medicine | Admitting: Emergency Medicine

## 2022-05-19 DIAGNOSIS — I10 Essential (primary) hypertension: Secondary | ICD-10-CM | POA: Insufficient documentation

## 2022-05-19 DIAGNOSIS — Z743 Need for continuous supervision: Secondary | ICD-10-CM | POA: Diagnosis not present

## 2022-05-19 DIAGNOSIS — R531 Weakness: Secondary | ICD-10-CM | POA: Diagnosis not present

## 2022-05-19 DIAGNOSIS — E119 Type 2 diabetes mellitus without complications: Secondary | ICD-10-CM | POA: Insufficient documentation

## 2022-05-19 DIAGNOSIS — R5383 Other fatigue: Secondary | ICD-10-CM | POA: Diagnosis not present

## 2022-05-19 DIAGNOSIS — R6889 Other general symptoms and signs: Secondary | ICD-10-CM | POA: Diagnosis not present

## 2022-05-19 DIAGNOSIS — J45909 Unspecified asthma, uncomplicated: Secondary | ICD-10-CM | POA: Diagnosis not present

## 2022-05-19 LAB — CBC WITH DIFFERENTIAL/PLATELET
Abs Immature Granulocytes: 0.01 10*3/uL (ref 0.00–0.07)
Basophils Absolute: 0 10*3/uL (ref 0.0–0.1)
Basophils Relative: 1 %
Eosinophils Absolute: 0.1 10*3/uL (ref 0.0–0.5)
Eosinophils Relative: 1 %
HCT: 36.7 % (ref 36.0–46.0)
Hemoglobin: 11.9 g/dL — ABNORMAL LOW (ref 12.0–15.0)
Immature Granulocytes: 0 %
Lymphocytes Relative: 24 %
Lymphs Abs: 1.4 10*3/uL (ref 0.7–4.0)
MCH: 31.8 pg (ref 26.0–34.0)
MCHC: 32.4 g/dL (ref 30.0–36.0)
MCV: 98.1 fL (ref 80.0–100.0)
Monocytes Absolute: 0.4 10*3/uL (ref 0.1–1.0)
Monocytes Relative: 8 %
Neutro Abs: 3.7 10*3/uL (ref 1.7–7.7)
Neutrophils Relative %: 66 %
Platelets: 230 10*3/uL (ref 150–400)
RBC: 3.74 MIL/uL — ABNORMAL LOW (ref 3.87–5.11)
RDW: 12.4 % (ref 11.5–15.5)
WBC: 5.7 10*3/uL (ref 4.0–10.5)
nRBC: 0 % (ref 0.0–0.2)

## 2022-05-19 LAB — COMPREHENSIVE METABOLIC PANEL
ALT: 15 U/L (ref 0–44)
AST: 22 U/L (ref 15–41)
Albumin: 3.7 g/dL (ref 3.5–5.0)
Alkaline Phosphatase: 61 U/L (ref 38–126)
Anion gap: 8 (ref 5–15)
BUN: 22 mg/dL (ref 8–23)
CO2: 26 mmol/L (ref 22–32)
Calcium: 9.8 mg/dL (ref 8.9–10.3)
Chloride: 104 mmol/L (ref 98–111)
Creatinine, Ser: 0.96 mg/dL (ref 0.44–1.00)
GFR, Estimated: 58 mL/min — ABNORMAL LOW (ref >60)
Glucose, Bld: 144 mg/dL — ABNORMAL HIGH (ref 70–99)
Potassium: 3.3 mmol/L — ABNORMAL LOW (ref 3.5–5.1)
Sodium: 138 mmol/L (ref 135–145)
Total Bilirubin: 1.1 mg/dL (ref 0.3–1.2)
Total Protein: 7.3 g/dL (ref 6.5–8.1)

## 2022-05-19 LAB — URINALYSIS, ROUTINE W REFLEX MICROSCOPIC
Bacteria, UA: NONE SEEN
Bilirubin Urine: NEGATIVE
Glucose, UA: NEGATIVE mg/dL
Hgb urine dipstick: NEGATIVE
Ketones, ur: 5 mg/dL — AB
Nitrite: NEGATIVE
Protein, ur: NEGATIVE mg/dL
Specific Gravity, Urine: 1.015 (ref 1.005–1.030)
pH: 5 (ref 5.0–8.0)

## 2022-05-19 LAB — TROPONIN I (HIGH SENSITIVITY): Troponin I (High Sensitivity): 7 ng/L (ref <18)

## 2022-05-19 LAB — TSH: TSH: 1.268 u[IU]/mL (ref 0.350–4.500)

## 2022-05-19 LAB — CBG MONITORING, ED: Glucose-Capillary: 136 mg/dL — ABNORMAL HIGH (ref 70–99)

## 2022-05-19 MED ORDER — POTASSIUM CHLORIDE CRYS ER 20 MEQ PO TBCR
40.0000 meq | EXTENDED_RELEASE_TABLET | Freq: Once | ORAL | Status: AC
Start: 2022-05-19 — End: 2022-05-19
  Administered 2022-05-19: 40 meq via ORAL
  Filled 2022-05-19: qty 2

## 2022-05-19 NOTE — ED Triage Notes (Signed)
Pt presents via EMS with complaints of weakness for the last several days. Pt took her diuretic medication last night (which she takes every other day) and has felt weak. Denies CP or SOB.

## 2022-05-19 NOTE — ED Provider Notes (Signed)
[facility] Provider Note    Event Date/Time   First MD Initiated Contact with Patient 05/19/22 [phone]     (approximate)   History   Chief Complaint Weakness   HPI  Vanessa Vazquez is a 87 y.o. female with past medical history of hypertension, hyperlipidemia, diabetes, asthma, and GERD who presents to the ED complaining of generalized weakness.  Patient reports that over the past 2 days she has been feeling much more fatigued than usual.  She has still been able to get around her home, but states she feels exhausted going to and from the bathroom after taking her Lasix.  She has not had any dysuria, hematuria, fever, or flank pain.  She has been eating and drinking normally, denies any abdominal pain, nausea, vomiting, or diarrhea.  She also denies any cough, chest pain, or shortness of breath.  She has been compliant with her medications and denies any recent changes.     Physical Exam   Triage Vital Signs: ED Triage Vitals  Enc Vitals Group     BP 05/19/22 0717 (!) 151/69     Pulse Rate 05/19/22 0717 62     Resp 05/19/22 0717 20     Temp 05/19/22 0717 98 F (36.7 C)     Temp src --      SpO2 05/19/22 0717 96 %     Weight 05/19/22 0656 232 lb 5.8 oz (105.4 kg)     Height 05/19/22 0656 '5\' 3"'$  (1.6 m)     Head Circumference --      Peak Flow --      Pain Score 05/19/22 0656 0     Pain Loc --      Pain Edu? --      Excl. in [place]? --     Most recent vital signs: Vitals:   05/19/22 0717  BP: (!) 151/69  Pulse: 62  Resp: 20  Temp: 98 F (36.7 C)  SpO2: 96%    Constitutional: Alert and oriented. Eyes: Conjunctivae are normal. Head: Atraumatic. Nose: No congestion/rhinnorhea. Mouth/Throat: Mucous membranes are moist.  Cardiovascular: Normal rate, regular rhythm. Grossly normal heart sounds.  2+ radial pulses bilaterally. Respiratory: Normal respiratory effort.  No retractions. Lungs CTAB. Gastrointestinal: Soft and nontender. No  distention. Musculoskeletal: No lower extremity tenderness nor edema.  Neurologic:  Normal speech and language. No gross focal neurologic deficits are appreciated.    ED Results / Procedures / Treatments   Labs (all labs ordered are listed, but only abnormal results are displayed) Labs Reviewed  CBC WITH DIFFERENTIAL/PLATELET - Abnormal; Notable for the following components:      Result Value   RBC 3.74 (*)    Hemoglobin 11.9 (*)    All other components within normal limits  COMPREHENSIVE METABOLIC PANEL - Abnormal; Notable for the following components:   Potassium 3.3 (*)    Glucose, Bld 144 (*)    GFR, Estimated 58 (*)    All other components within normal limits  URINALYSIS, ROUTINE W REFLEX MICROSCOPIC - Abnormal; Notable for the following components:   Color, Urine YELLOW (*)    APPearance CLEAR (*)    Ketones, ur 5 (*)    Leukocytes,Ua TRACE (*)    All other components within normal limits  CBG MONITORING, ED - Abnormal; Notable for the following components:   Glucose-Capillary 136 (*)    All other components within normal limits  TSH  TROPONIN I (HIGH SENSITIVITY)     EKG  ED ECG REPORT I, Blake Divine, the attending physician, personally viewed and interpreted this ECG.   Date: 05/19/2022  EKG Time: 7:12  Rate: 77  Rhythm: normal sinus rhythm  Axis: Normal  Intervals:left bundle branch block  ST&T Change: None  RADIOLOGY Chest x-ray reviewed and interpreted by me with no infiltrate, edema, or effusion.  PROCEDURES:  Critical Care performed: No  Procedures   MEDICATIONS ORDERED IN ED: Medications  potassium chloride SA (KLOR-CON M) CR tablet 40 mEq (40 mEq Oral Given 05/19/22 1040)     IMPRESSION / MDM / ASSESSMENT AND PLAN / ED COURSE  I reviewed the triage vital signs and the nursing notes.                              87 y.o. female with past medical history of hypertension, diabetes, hyperlipidemia, asthma, and GERD who presents to the  ED complaining of generalized weakness over the past 2 days.  Patient's presentation is most consistent with acute presentation with potential threat to life or bodily function.  Differential diagnosis includes, but is not limited to, anemia, AKI, electrolyte abnormality, UTI, pneumonia, ACS, hypothyroidism.  Patient nontoxic-appearing and in no acute distress, vital signs are unremarkable.  She complains of generalized weakness and has no focal neurologic deficits on exam, doubt intracranial process.  EKG shows no evidence of arrhythmia or ischemia and troponin is negative, doubt cardiac etiology.  Chest x-ray is unremarkable and remainder of labs are reassuring with no significant anemia, leukocytosis, electrolyte abnormality, or AKI.  We will check urinalysis and add on TSH level.  TSH is unremarkable and urinalysis shows no signs of infection.  Given reassuring workup and vital signs, patient is appropriate for discharge home with PCP follow-up.  She was counseled to return to the ED for new or worsening symptoms, patient agrees with plan.      FINAL CLINICAL IMPRESSION(S) / ED DIAGNOSES   Final diagnoses:  Generalized weakness     Rx / DC Orders   ED Discharge Orders     None        Note:  This document was prepared using Dragon voice recognition software and may include unintentional dictation errors.   Blake Divine, MD 05/19/22 1115

## 2022-05-19 NOTE — ED Notes (Signed)
Pt discharged by paramedic.

## 2022-05-31 ENCOUNTER — Telehealth: Payer: Self-pay

## 2022-05-31 MED ORDER — PREGABALIN 25 MG PO CAPS: 50.0000 mg | ORAL_CAPSULE | Freq: Two times a day (BID) | ORAL | 0 refills | Status: DC

## 2022-05-31 NOTE — Telephone Encounter (Signed)
Patient called in stating that the Lyrica 50 MG is out of stock, asking for 25 MG with instructions of take twice daily to be called back.

## 2022-05-31 NOTE — Telephone Encounter (Signed)
     Patient  visit on 05/19/2022  at [facility] was for generalized weakness.  Have you been able to follow up with your primary care physician? Patient has not seen PCP stated she was unable to reach office. I called the office and they will reach out to her today.  The patient was or was not able to obtain any needed medicine or equipment. No medications prescribed.  Are there diet recommendations that you are having difficulty following? No  Patient expresses understanding of discharge instructions and education provided has no other needs at this time.    [place] Resource Care Guide   [email or web address]  ?? [phone]   Website: [email or web address]  [email or web address]

## 2022-06-05 ENCOUNTER — Ambulatory Visit (INDEPENDENT_AMBULATORY_CARE_PROVIDER_SITE_OTHER): Payer: 59 | Admitting: Internal Medicine

## 2022-06-05 ENCOUNTER — Encounter: Payer: Self-pay | Admitting: Internal Medicine

## 2022-06-05 VITALS — BP 120/70 | HR 76 | Ht 63.0 in | Wt 228.0 lb

## 2022-06-05 DIAGNOSIS — E119 Type 2 diabetes mellitus without complications: Secondary | ICD-10-CM

## 2022-06-05 DIAGNOSIS — M1711 Unilateral primary osteoarthritis, right knee: Secondary | ICD-10-CM | POA: Diagnosis not present

## 2022-06-05 DIAGNOSIS — I10 Essential (primary) hypertension: Secondary | ICD-10-CM

## 2022-06-05 DIAGNOSIS — J301 Allergic rhinitis due to pollen: Secondary | ICD-10-CM | POA: Diagnosis not present

## 2022-06-05 DIAGNOSIS — Z6841 Body Mass Index (BMI) 40.0 and over, adult: Secondary | ICD-10-CM

## 2022-06-05 NOTE — Assessment & Plan Note (Signed)

## 2022-06-05 NOTE — Assessment & Plan Note (Signed)
Take Claritin 10 mg p.o. daily

## 2022-06-05 NOTE — Assessment & Plan Note (Signed)

## 2022-06-05 NOTE — Assessment & Plan Note (Signed)
Stable refer to Ortho

## 2022-06-05 NOTE — Progress Notes (Signed)
Established Patient Office Visit  Subjective:  Patient ID: DEANA KROCK, female    DOB: March 25, 1935  Age: 88 y.o. MRN: [id number]  CC:  Chief Complaint  Patient presents with   Hospitalization Follow-up    HPI  ANISE HARBIN presents for hospital follow up  Past Medical History:  Diagnosis Date   Asthma    Diabetes mellitus without complication ([place])    GERD (gastroesophageal reflux disease)    Hypercholesteremia    Hypertension     Past Surgical History:  Procedure Laterality Date   CESAREAN SECTION     COLONOSCOPY WITH PROPOFOL N/A 03/19/2016   Procedure: COLONOSCOPY WITH PROPOFOL;  Surgeon: Manya Silvas, MD;  Location: [place];  Service: Endoscopy;  Laterality: N/A;    Family History  Problem Relation Age of Onset   Lung cancer Father    Leukemia Sister    Diabetes Sister    Asthma Child     Social History   Socioeconomic History   Marital status: Widowed    Spouse name: Not on file   Number of children: Not on file   Years of education: Not on file   Highest education level: GED or equivalent  Occupational History   Not on file  Tobacco Use   Smoking status: Former   Smokeless tobacco: Never  Substance and Sexual Activity   Alcohol use: No    Alcohol/week: 0.0 standard drinks of alcohol   Drug use: No   Sexual activity: Never  Other Topics Concern   Not on file  Social History Narrative   Not on file   Social Determinants of Health   Financial Resource Strain: Low Risk  (05/14/2022)   Overall Financial Resource Strain (CARDIA)    Difficulty of Paying Living Expenses: Not hard at all  Food Insecurity: No Food Insecurity (05/14/2022)   Hunger Vital Sign    Worried About Running Out of Food in the Last Year: Never true    [place] in the Last Year: Never true  Transportation Needs: No Transportation Needs (05/14/2022)   PRAPARE - Hydrologist (Medical): No    Lack of Transportation (Non-Medical):  No  Physical Activity: Inactive (05/14/2022)   Exercise Vital Sign    Days of Exercise per Week: 0 days    Minutes of Exercise per Session: 0 min  Stress: No Stress Concern Present (05/14/2022)   [place]    Feeling of Stress : Not at all  Social Connections: Socially Isolated (05/14/2022)   Social Connection and Isolation Panel [NHANES]    Frequency of Communication with Friends and Family: Twice a week    Frequency of Social Gatherings with Friends and Family: Never    Attends Religious Services: More than 4 times per year    Active Member of [employer] or Organizations: No    Attends Archivist Meetings: Never    Marital Status: Widowed  Intimate Partner Violence: Not At Risk (05/14/2022)   Humiliation, Afraid, Rape, and Kick questionnaire    Fear of Current or Ex-Partner: No    Emotionally Abused: No    Physically Abused: No    Sexually Abused: No     Current Outpatient Medications:    albuterol (VENTOLIN HFA) 108 (90 Base) MCG/ACT inhaler, Inhale 2 puffs into the lungs every 4 (four) hours as needed for wheezing or shortness of breath., Disp: 18 g, Rfl: 6   atenolol (  TENORMIN) 25 MG tablet, TAKE 1 TABLET BY MOUTH ONCE DAILY, Disp: 30 tablet, Rfl: 6   atorvastatin (LIPITOR) 40 MG tablet, Take 1 tablet (40 mg total) by mouth daily., Disp: 90 tablet, Rfl: 3   ferrous sulfate 300 (60 Fe) MG/5ML syrup, Take 5 mLs (300 mg total) by mouth daily., Disp: 150 mL, Rfl: 3   glimepiride (AMARYL) 2 MG tablet, TAKE 1 TABLET BY MOUTH TWICE DAILY, Disp: 180 tablet, Rfl: 3   JANUVIA 100 MG tablet, TAKE 1 TABLET BY MOUTH ONCE DAILY, Disp: 90 tablet, Rfl: 3   loratadine (CLARITIN) 10 MG tablet, Take 1 tablet (10 mg total) by mouth daily., Disp: 90 tablet, Rfl: 3   losartan-hydrochlorothiazide (HYZAAR) 50-12.5 MG tablet, TAKE 2 TABLETS BY MOUTH ONCE DAILY, Disp: 180 tablet, Rfl: 2   metFORMIN (GLUCOPHAGE) 1000 MG tablet, TAKE  1 TABLET BY MOUTH TWICE DAILY, Disp: 180 tablet, Rfl: 3   Misc. Devices (WALKER) MISC, 1 each by Does not apply route daily., Disp: 1 each, Rfl: 0   omeprazole (PRILOSEC) 20 MG capsule, TAKE 1 CAPSULE BY MOUTH ONCE DAILY, Disp: 30 capsule, Rfl: 3   pregabalin (LYRICA) 25 MG capsule, Take 2 capsules (50 mg total) by mouth 2 (two) times daily., Disp: 120 capsule, Rfl: 0   Respiratory Therapy Supplies (NEBULIZER) DEVI, 1 Device by Does not apply route 2 (two) times daily as needed., Disp: 1 each, Rfl: 0   No Known Allergies  ROS Review of Systems  Constitutional: Negative.   HENT: Negative.    Eyes: Negative.   Respiratory: Negative.    Cardiovascular: Negative.   Gastrointestinal: Negative.   Endocrine: Negative.   Genitourinary: Negative.   Musculoskeletal: Negative.   Skin: Negative.   Allergic/Immunologic: Negative.   Neurological: Negative.   Hematological: Negative.   Psychiatric/Behavioral: Negative.    All other systems reviewed and are negative.     Objective:    Physical Exam Vitals reviewed.  Constitutional:      Appearance: Normal appearance.  HENT:     Mouth/Throat:     Mouth: Mucous membranes are moist.  Eyes:     Pupils: Pupils are equal, round, and reactive to light.  Neck:     Vascular: No carotid bruit.  Cardiovascular:     Rate and Rhythm: Normal rate and regular rhythm.     Pulses: Normal pulses.     Heart sounds: Normal heart sounds.  Pulmonary:     Effort: Pulmonary effort is normal.     Breath sounds: Normal breath sounds.  Abdominal:     General: Bowel sounds are normal.     Palpations: Abdomen is soft. There is no hepatomegaly, splenomegaly or mass.     Tenderness: There is no abdominal tenderness.     Hernia: No hernia is present.  Musculoskeletal:        General: No tenderness.     Cervical back: Neck supple.     Right lower leg: No edema.     Left lower leg: No edema.  Skin:    Findings: No rash.  Neurological:     Mental Status:  She is alert and oriented to person, place, and time.     Motor: No weakness.  Psychiatric:        Mood and Affect: Mood and affect normal.        Behavior: Behavior normal.     BP 120/70   Pulse 76   Ht '5\' 3"'$  (1.6 m)   Wt 228 lb (103.4  kg)   SpO2 96%   BMI 40.39 kg/m  Wt Readings from Last 3 Encounters:  06/05/22 228 lb (103.4 kg)  05/19/22 232 lb 5.8 oz (105.4 kg)  05/14/22 232 lb (105.2 kg)     Health Maintenance Due  Topic Date Due   COVID-19 Vaccine (1) Never done   FOOT EXAM  Never done   Diabetic kidney evaluation - Urine ACR  Never done   DTaP/Tdap/Td (1 - Tdap) Never done   Zoster Vaccines- Shingrix (1 of 2) Never done   Pneumonia Vaccine 39+ Years old (2 - PCV) 04/11/2004    There are no preventive care reminders to display for this patient.  Lab Results  Component Value Date   TSH 1.268 05/19/2022   Lab Results  Component Value Date   WBC 5.7 05/19/2022   HGB 11.9 (L) 05/19/2022   HCT 36.7 05/19/2022   MCV 98.1 05/19/2022   PLT 230 05/19/2022   Lab Results  Component Value Date   NA 138 05/19/2022   K 3.3 (L) 05/19/2022   CO2 26 05/19/2022   GLUCOSE 144 (H) 05/19/2022   BUN 22 05/19/2022   CREATININE 0.96 05/19/2022   BILITOT 1.1 05/19/2022   ALKPHOS 61 05/19/2022   AST 22 05/19/2022   ALT 15 05/19/2022   PROT 7.3 05/19/2022   ALBUMIN 3.7 05/19/2022   CALCIUM 9.8 05/19/2022   ANIONGAP 8 05/19/2022   EGFR 59 (L) 04/11/2022   Lab Results  Component Value Date   CHOL 176 01/16/2021   Lab Results  Component Value Date   HDL 50 01/16/2021   Lab Results  Component Value Date   LDLCALC 103 (H) 01/16/2021   Lab Results  Component Value Date   TRIG 124 01/16/2021   Lab Results  Component Value Date   CHOLHDL 3.5 01/16/2021   Lab Results  Component Value Date   HGBA1C 4.7 01/16/2022      Assessment & Plan:   Problem List Items Addressed This Visit       Cardiovascular and Mediastinum   Essential hypertension - Primary      Patient denies any chest pain or shortness of breath there is no history of palpitation or paroxysmal nocturnal dyspnea   patient was advised to follow low-salt low-cholesterol diet    ideally I want to keep systolic blood pressure below 130 mmHg, patient was asked to check blood pressure one times a week and give me a report on that.  Patient will be follow-up in 3 months  or earlier as needed, patient will call me back for any change in the cardiovascular symptoms Patient was advised to buy a book from local bookstore concerning blood pressure and read several chapters  every day.  This will be supplemented by some of the material we will give him from the office.  Patient should also utilize other resources like YouTube and Internet to learn more about the blood pressure and the diet.        Respiratory   Seasonal allergic rhinitis due to pollen    Take Claritin 10 mg p.o. daily        Endocrine   Type 2 diabetes mellitus without complication, without long-term current use of insulin ([place])    - The patient's blood sugar is labile on med. - The patient will continue the current treatment regimen.  - I encouraged the patient to regularly check blood sugar.  - I encouraged the patient to monitor diet. I encouraged the patient  to eat low-carb and low-sugar to help prevent blood sugar spikes.  - I encouraged the patient to continue following their prescribed treatment plan for diabetes - I informed the patient to get help if blood sugar drops below '54mg'$ /dL, or if suddenly have trouble thinking clearly or breathing.  Patient was advised to buy a book on diabetes from a local bookstore or from [place]  Patient should read 2 chapters every day to keep the motivation going, this is in addition to some of the materials we provided them from the office.  There are other resources on the Internet like YouTube and wilkipedia to get an education on the diabetes        Musculoskeletal and Integument    Primary osteoarthritis of right knee    Stable refer to Ortho        Other   Morbid obesity ([place])   Patient was advised to stop the Lasix No orders of the defined types were placed in this encounter.   Follow-up: No follow-ups on file.    Cletis [place], MD

## 2022-08-13 IMAGING — DX DG FINGER INDEX 2+V*R*
3 series · 3 of 3 positions shown · non-contrast
Comparison: None.

CLINICAL DATA: Pain after a fall

EXAM:
RIGHT INDEX FINGER 2+V

[finger ap]
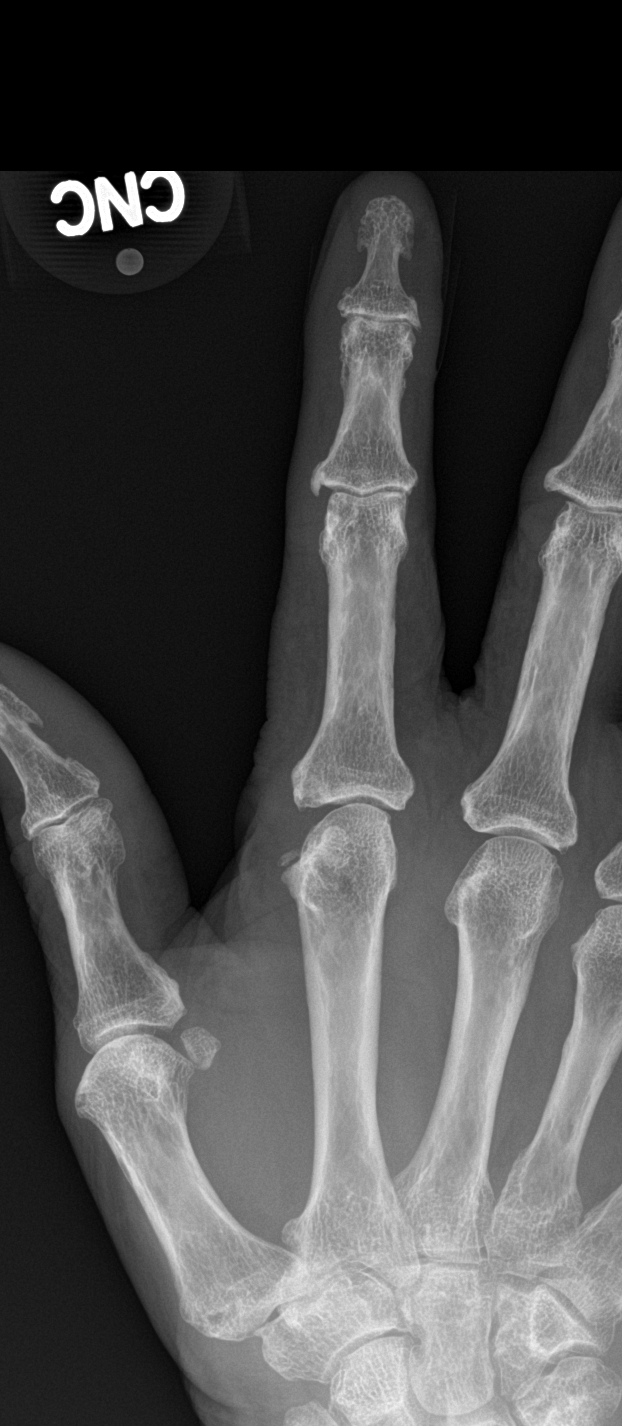

[finger obl]
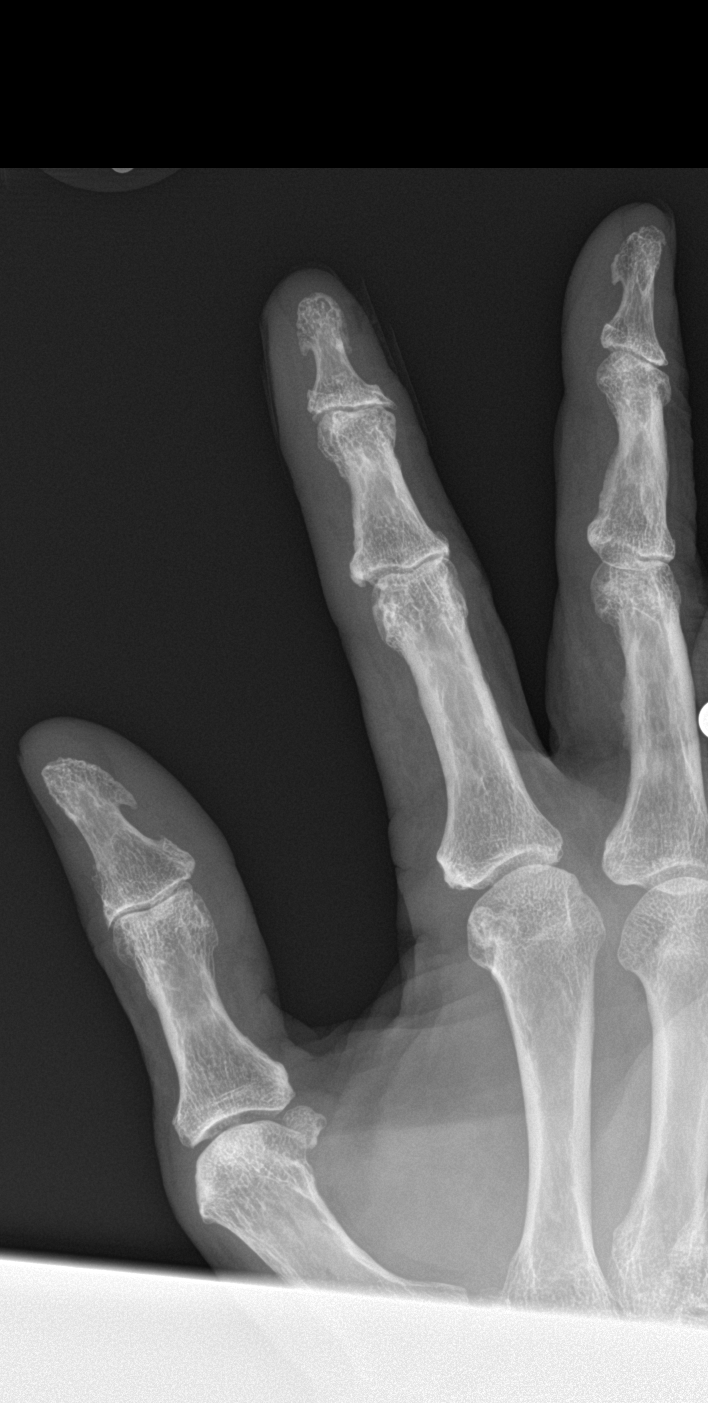

[finger lat]
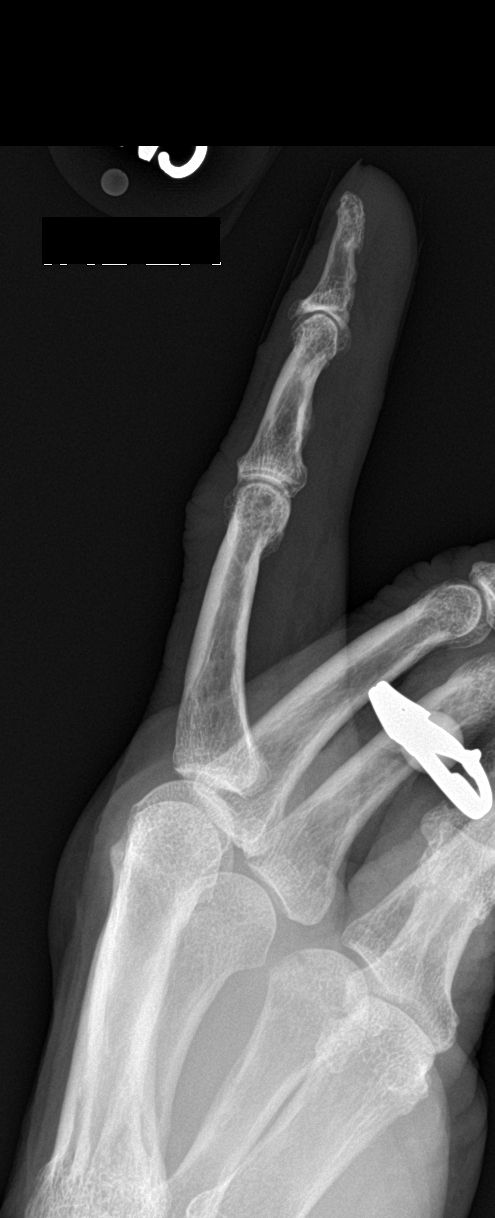

[3 of 3 positions shown; findings below may reference images not displayed]

FINDINGS: There is no evidence of fracture or dislocation. Mild-to-moderate
degenerative changes are seen in the interphalangeal joints.
IMPRESSION: No acute osseous injury.

## 2022-08-29 ENCOUNTER — Ambulatory Visit: Payer: Self-pay | Admitting: *Deleted

## 2022-08-29 NOTE — Telephone Encounter (Signed)
Summary: Cough, green sputum   The patient called in to schedule a New pt appt which will be next month but in the meantime she has had a bad cough for a couple of weeks. She is blowing out green stuff. Please assist patient further as she is worried because she is not getting any better and her appt is still quite a ways off.     Reason for Disposition  [1] Sinus congestion (pressure, fullness) AND [2] present > 10 days  Answer Assessment - Initial Assessment Questions 1. ONSET: "When did the cough begin?"      2 weeks 2. SEVERITY: "How bad is the cough today?"      *No Answer* 3. SPUTUM: "Describe the color of your sputum" (none, dry cough; clear, white, yellow, green)     *No Answer* 4. HEMOPTYSIS: "Are you coughing up any blood?" If so ask: "How much?" (flecks, streaks, tablespoons, etc.)     *No Answer* 5. DIFFICULTY BREATHING: "Are you having difficulty breathing?" If Yes, ask: "How bad is it?" (e.g., mild, moderate, severe)    - MILD: No SOB at rest, mild SOB with walking, speaks normally in sentences, can lie down, no retractions, pulse < 100.    - MODERATE: SOB at rest, SOB with minimal exertion and prefers to sit, cannot lie down flat, speaks in phrases, mild retractions, audible wheezing, pulse 100-120.    - SEVERE: Very SOB at rest, speaks in single words, struggling to breathe, sitting hunched forward, retractions, pulse > 120      *No Answer* 6. FEVER: "Do you have a fever?" If Yes, ask: "What is your temperature, how was it measured, and when did it start?"     *No Answer* 7. CARDIAC HISTORY: "Do you have any history of heart disease?" (e.g., heart attack, congestive heart failure)      *No Answer* 8. LUNG HISTORY: "Do you have any history of lung disease?"  (e.g., pulmonary embolus, asthma, emphysema)     *No Answer* 9. PE RISK FACTORS: "Do you have a history of blood clots?" (or: recent major surgery, recent prolonged travel, bedridden)     *No Answer* 10. OTHER  SYMPTOMS: "Do you have any other symptoms?" (e.g., runny nose, wheezing, chest pain)       *No Answer* 11. PREGNANCY: "Is there any chance you are pregnant?" "When was your last menstrual period?"       *No Answer* 12. TRAVEL: "Have you traveled out of the country in the last month?" (e.g., travel history, exposures)       *No Answer*  Answer Assessment - Initial Assessment Questions 1. LOCATION: "Where does it hurt?"      Nasal congestion  2. ONSET: "When did the sinus pain start?"  (e.g., hours, days)      2 weeks 3. SEVERITY: "How bad is the pain?"   (Scale 1-10; mild, moderate or severe)   - MILD (1-3): doesn't interfere with normal activities    - MODERATE (4-7): interferes with normal activities (e.g., work or school) or awakens from sleep   - SEVERE (8-10): excruciating pain and patient unable to do any normal activities        No sinus pain- congestion 4. RECURRENT SYMPTOM: "Have you ever had sinus problems before?" If Yes, ask: "When was the last time?" and "What happened that time?"      No- allergy hx 5. NASAL CONGESTION: "Is the nose blocked?" If Yes, ask: "Can you open it or must you breathe  through your mouth?"     Both- comes and goes 6. NASAL DISCHARGE: "Do you have discharge from your nose?" If so ask, "What color?"     Clear/green- left side  7. FEVER: "Do you have a fever?" If Yes, ask: "What is it, how was it measured, and when did it start?"      no 8. OTHER SYMPTOMS: "Do you have any other symptoms?" (e.g., sore throat, cough, earache, difficulty breathing)     cough  Protocols used: Cough - Acute Productive-A-AH, Sinus Pain or Congestion-A-AH

## 2022-08-29 NOTE — Telephone Encounter (Signed)
  Chief Complaint: cough, congestion Symptoms: cough- chest congestion, nasal congestion Frequency: 2 weeks Pertinent Negatives: Patient denies fever Disposition: [] ED /[x] Urgent Care (no appt availability in office) / [] Appointment(In office/virtual)/ []  [facility]/ [] Home Care/ [] Refused Recommended Disposition /[] [facility]/ []  Follow-up with PCP Additional Notes: Patient is between providers and has been advised UC for evaluation of her symptoms.

## 2022-08-30 ENCOUNTER — Ambulatory Visit
Admission: EM | Admit: 2022-08-30 | Discharge: 2022-08-30 | Disposition: A | Discharge status: Home / Self Care | Payer: 59 | Attending: Physician Assistant | Admitting: Physician Assistant

## 2022-08-30 ENCOUNTER — Ambulatory Visit (INDEPENDENT_AMBULATORY_CARE_PROVIDER_SITE_OTHER): Payer: 59

## 2022-08-30 DIAGNOSIS — J40 Bronchitis, not specified as acute or chronic: Secondary | ICD-10-CM

## 2022-08-30 DIAGNOSIS — R051 Acute cough: Secondary | ICD-10-CM | POA: Diagnosis not present

## 2022-08-30 DIAGNOSIS — J329 Chronic sinusitis, unspecified: Secondary | ICD-10-CM

## 2022-08-30 DIAGNOSIS — R059 Cough, unspecified: Secondary | ICD-10-CM | POA: Diagnosis not present

## 2022-08-30 MED ORDER — AMOXICILLIN-POT CLAVULANATE 500-125 MG PO TABS: 1.0000 | ORAL_TABLET | Freq: Two times a day (BID) | ORAL | 0 refills | Status: DC

## 2022-08-30 MED ORDER — BENZONATATE 100 MG PO CAPS: 100.0000 mg | ORAL_CAPSULE | Freq: Three times a day (TID) | ORAL | 0 refills | Status: DC

## 2022-08-30 NOTE — Discharge Instructions (Signed)
Your x-ray did not show any evidence of pneumonia which is great news.  We are going to start an antibiotic to cover for sinus symptoms.  Please start Augmentin twice daily.  You can use over-the-counter medication including Mucinex and Flonase for additional symptom relief.  Continue your loratadine daily.  Use nasal saline and sinus rinses.  Make sure that you rest and drink plenty of fluid.  I have called in Tessalon to help with your cough.  Follow-up with your primary care next week.  If anything worsens and you have fever, worsening cough, shortness of breath, nausea/vomiting interfering with oral intake, weakness you need to be seen immediately.

## 2022-08-30 NOTE — ED Triage Notes (Signed)
Patient to Urgent Care with complaints of productive cough/ nasal congestion. Reports sinus pain and pressure. Headaches.   Symptoms started two weeks ago. Denies any known fevers. Has been taking an otc cough medication (diabetic tussin).

## 2022-08-30 NOTE — ED Provider Notes (Signed)
[facility]    CSN: [id number] Arrival date & time: 08/30/22  1026      History   Chief Complaint Chief Complaint  Patient presents with   Cough   Nasal Congestion    HPI Vanessa Vazquez is a 88 y.o. female.   Patient presents today with a 2-week history of URI symptoms.  Reports nasal congestion, sinus pressure, cough.  Denies any significant chest pain, shortness of breath, fever, nausea, vomiting.  Denies any known sick contacts.  She has had COVID-19 approximately 1 year ago.  She has not had COVID-19 or influenza vaccinations.  She has a history of asthma and has required her albuterol inhaler a few times since her symptoms began but denies regular use of this medication.  Denies any recent antibiotics or steroids.  She has tried over-the-counter Coricidin and cough medicine with minimal improvement of symptoms.  She denies history of COPD, smoking, allergies.    Past Medical History:  Diagnosis Date   Asthma    Diabetes mellitus without complication    GERD (gastroesophageal reflux disease)    Hypercholesteremia    Hypertension     Patient Active Problem List   Diagnosis Date Noted   Diabetes mellitus 05/14/2022   Hyperlipidemia 05/14/2022   Arthritis 05/14/2022   Knee capsulitis, left 02/14/2022   Primary osteoarthritis of right knee 05/31/2021   Myalgia due to statin 04/18/2021   Diabetic foot 04/18/2021   Need for Tdap vaccination 04/18/2021   Head injury 10/04/2020   Fatigue 02/11/2020   Iron deficiency anemia secondary to inadequate dietary iron intake 01/06/2020   Annual physical exam 01/06/2020   Morbid obesity 11/23/2019   Seasonal allergic rhinitis due to pollen 10/21/2019   Type 2 diabetes mellitus without complication, without long-term current use of insulin 10/21/2019   AVM (arteriovenous malformation) of colon with hemorrhage 05/11/2016   Essential (primary) hypertension 05/08/2016   GI bleed 03/17/2016   Vaginal odor 12/14/2015   Vulvar  itching 12/14/2015   Vaginal leukorrhea 12/14/2015   Essential hypertension    Hypercholesteremia    Asthma     Past Surgical History:  Procedure Laterality Date   CESAREAN SECTION     COLONOSCOPY WITH PROPOFOL N/A 03/19/2016   Procedure: COLONOSCOPY WITH PROPOFOL;  Surgeon: Manya Silvas, MD;  Location: [facility];  Service: Endoscopy;  Laterality: N/A;    OB History     Gravida  6   Para      Term      Preterm      AB      Living  5      SAB      IAB      Ectopic      Multiple      Live Births  5            Home Medications    Prior to Admission medications   Medication Sig Start Date End Date Taking? Authorizing Provider  amoxicillin-clavulanate (AUGMENTIN) 500-125 MG tablet Take 1 tablet by mouth in the morning and at bedtime. 08/30/22  Yes Tiearra Colwell K, PA-C  benzonatate (TESSALON) 100 MG capsule Take 1 capsule (100 mg total) by mouth every 8 (eight) hours. 08/30/22  Yes Denym Christenberry K, PA-C  albuterol (VENTOLIN HFA) 108 (90 Base) MCG/ACT inhaler Inhale 2 puffs into the lungs every 4 (four) hours as needed for wheezing or shortness of breath. 01/01/22   Merlyn Lot, MD  atenolol (TENORMIN) 25 MG tablet TAKE 1  TABLET BY MOUTH ONCE DAILY 02/27/22   Cletis Athens, MD  atorvastatin (LIPITOR) 40 MG tablet Take 1 tablet (40 mg total) by mouth daily. 09/12/20   Cletis Athens, MD  ferrous sulfate 300 (60 Fe) MG/5ML syrup Take 5 mLs (300 mg total) by mouth daily. 01/06/20   Cletis Athens, MD  glimepiride (AMARYL) 2 MG tablet TAKE 1 TABLET BY MOUTH TWICE DAILY 01/02/22   Cletis Athens, MD  JANUVIA 100 MG tablet TAKE 1 TABLET BY MOUTH ONCE DAILY 01/22/22   Cletis Athens, MD  loratadine (CLARITIN) 10 MG tablet Take 1 tablet (10 mg total) by mouth daily. 06/26/21   Cletis Athens, MD  losartan-hydrochlorothiazide (HYZAAR) 50-12.5 MG tablet TAKE 2 TABLETS BY MOUTH ONCE DAILY 11/14/21   Cletis Athens, MD  metFORMIN (GLUCOPHAGE) 1000 MG tablet TAKE 1 TABLET BY MOUTH  TWICE DAILY 04/20/22   Theresia Lo, NP  Misc. Devices (WALKER) MISC 1 each by Does not apply route daily. 10/03/21   Cletis Athens, MD  omeprazole (PRILOSEC) 20 MG capsule TAKE 1 CAPSULE BY MOUTH ONCE DAILY 01/02/22   Cletis Athens, MD  pregabalin (LYRICA) 25 MG capsule Take 2 capsules (50 mg total) by mouth 2 (two) times daily. 05/31/22   Theresia Lo, NP  Respiratory Therapy Supplies (NEBULIZER) DEVI 1 Device by Does not apply route 2 (two) times daily as needed. 11/02/20   Cletis Athens, MD    Family History Family History  Problem Relation Age of Onset   Lung cancer Father    Leukemia Sister    Diabetes Sister    Asthma Child     Social History Social History   Tobacco Use   Smoking status: Former   Smokeless tobacco: Never  Substance Use Topics   Alcohol use: No    Alcohol/week: 0.0 standard drinks of alcohol   Drug use: No     Allergies   Patient has no known allergies.   Review of Systems Review of Systems  Constitutional:  Positive for activity change. Negative for appetite change, fatigue and fever.  HENT:  Positive for congestion and sinus pressure. Negative for sneezing and sore throat.   Respiratory:  Positive for cough. Negative for shortness of breath.   Cardiovascular:  Negative for chest pain.  Gastrointestinal:  Negative for abdominal pain, diarrhea, nausea and vomiting.  Neurological:  Negative for dizziness, light-headedness and headaches.     Physical Exam Triage Vital Signs ED Triage Vitals  Enc Vitals Group     BP 08/30/22 1049 (!) 148/76     Pulse Rate 08/30/22 1049 91     Resp 08/30/22 1049 20     Temp 08/30/22 1049 98.8 F (37.1 C)     Temp Source 08/30/22 1049 Oral     SpO2 08/30/22 1049 93 %     Weight --      Height --      Head Circumference --      Peak Flow --      Pain Score 08/30/22 1052 6     Pain Loc --      Pain Edu? --      Excl. in [place]? --    No data found.  Updated Vital Signs BP (!) 148/76 (BP Location: Left  Arm)   Pulse 91   Temp 98.8 F (37.1 C) (Oral)   Resp 20   SpO2 93%   Visual Acuity Right Eye Distance:   Left Eye Distance:   Bilateral Distance:    Right Eye Near:  Left Eye Near:    Bilateral Near:     Physical Exam Vitals reviewed.  Constitutional:      General: She is awake. She is not in acute distress.    Appearance: Normal appearance. She is well-developed. She is not ill-appearing.     Comments: Very pleasant female appears stated age in no acute distress sitting comfortably in exam room  HENT:     Head: Normocephalic and atraumatic.     Right Ear: Tympanic membrane, ear canal and external ear normal. There is impacted cerumen. Tympanic membrane is not erythematous or bulging.     Left Ear: Tympanic membrane, ear canal and external ear normal. Tympanic membrane is not erythematous or bulging.     Ears:     Comments: Right ear: Impacted cerumen; able to visualize approximately 40% of TM that appears normal.    Nose:     Right Sinus: Maxillary sinus tenderness present. No frontal sinus tenderness.     Left Sinus: Maxillary sinus tenderness present. No frontal sinus tenderness.     Mouth/Throat:     Pharynx: Uvula midline. No oropharyngeal exudate or posterior oropharyngeal erythema.  Cardiovascular:     Rate and Rhythm: Normal rate and regular rhythm.     Heart sounds: S1 normal and S2 normal. Murmur heard.  Pulmonary:     Effort: Pulmonary effort is normal.     Breath sounds: Examination of the right-lower field reveals decreased breath sounds. Examination of the left-lower field reveals decreased breath sounds. Decreased breath sounds present. No wheezing, rhonchi or rales.  Psychiatric:        Behavior: Behavior is cooperative.      UC Treatments / Results  Labs (all labs ordered are listed, but only abnormal results are displayed) Labs Reviewed - No data to display  EKG   Radiology DG Chest 2 View  Result Date: 08/30/2022 CLINICAL DATA:  Cough EXAM:  CHEST - 2 VIEW COMPARISON:  05/19/2022 FINDINGS: The heart size and mediastinal contours are within normal limits. Aortic atherosclerosis. Both lungs are clear. The visualized skeletal structures are unremarkable. IMPRESSION: No active cardiopulmonary disease. Electronically Signed   By: Davina Poke D.O.   On: 08/30/2022 11:47    Procedures Procedures (including critical care time)  Medications Ordered in UC Medications - No data to display  Initial Impression / Assessment and Plan / UC Course  I have reviewed the triage vital signs and the nursing notes.  Pertinent labs & imaging results that were available during my care of the patient were reviewed by me and considered in my medical decision making (see chart for details).     Patient is well-appearing, afebrile, nontoxic, nontachycardic.  Chest x-ray was obtained that showed no acute cardiopulmonary disease.  Given prolonged and worsening symptoms will cover for secondary bacterial infection with Augmentin twice daily.  No indication for dose adjustment based on metabolic panel from [id number] with creatinine of 0.96 and calculated creatinine clearance of 69.95 mL/min.  She was given Tessalon for cough.  Recommended over-the-counter medication including Mucinex, Flonase, Tylenol.  Can use nasal saline and sinus rinses for additional symptom relief.  She has previously been prescribed loratadine was encouraged to continue this medication.  Recommended she follow-up with her primary care next week.  Discussed that if she has any worsening symptoms including chest pain, shortness of breath, fever, nausea/vomiting, weakness she needs to be seen immediately.  Strict return precautions given.  Final Clinical Impressions(s) / UC Diagnoses   Final diagnoses:  Acute cough  Sinobronchitis     Discharge Instructions      Your x-ray did not show any evidence of pneumonia which is great news.  We are going to start an antibiotic to cover for  sinus symptoms.  Please start Augmentin twice daily.  You can use over-the-counter medication including Mucinex and Flonase for additional symptom relief.  Continue your loratadine daily.  Use nasal saline and sinus rinses.  Make sure that you rest and drink plenty of fluid.  I have called in Tessalon to help with your cough.  Follow-up with your primary care next week.  If anything worsens and you have fever, worsening cough, shortness of breath, nausea/vomiting interfering with oral intake, weakness you need to be seen immediately.     ED Prescriptions     Medication Sig Dispense Auth. Provider   amoxicillin-clavulanate (AUGMENTIN) 500-125 MG tablet Take 1 tablet by mouth in the morning and at bedtime. 20 tablet Tanishi Nault K, PA-C   benzonatate (TESSALON) 100 MG capsule Take 1 capsule (100 mg total) by mouth every 8 (eight) hours. 21 capsule Jareth Pardee K, PA-C      PDMP not reviewed this encounter.   Terrilee Croak, PA-C 08/30/22 1209

## 2022-09-29 ENCOUNTER — Other Ambulatory Visit: Payer: Self-pay | Admitting: Nurse Practitioner

## 2022-10-01 ENCOUNTER — Ambulatory Visit
Admission: EM | Admit: 2022-10-01 | Discharge: 2022-10-01 | Disposition: A | Discharge status: Home / Self Care | Payer: 59 | Attending: Urgent Care | Admitting: Urgent Care

## 2022-10-01 DIAGNOSIS — R5383 Other fatigue: Secondary | ICD-10-CM

## 2022-10-01 DIAGNOSIS — R110 Nausea: Secondary | ICD-10-CM | POA: Diagnosis not present

## 2022-10-01 LAB — POCT URINALYSIS DIP (MANUAL ENTRY)
Bilirubin, UA: NEGATIVE
Blood, UA: NEGATIVE
Glucose, UA: NEGATIVE mg/dL
Ketones, POC UA: NEGATIVE mg/dL
Leukocytes, UA: NEGATIVE
Nitrite, UA: NEGATIVE
Protein Ur, POC: NEGATIVE mg/dL
Spec Grav, UA: 1.025 (ref 1.010–1.025)
Urobilinogen, UA: 0.2 E.U./dL
pH, UA: 5.5 (ref 5.0–8.0)

## 2022-10-01 LAB — POCT FASTING CBG KUC MANUAL ENTRY: POCT Glucose (KUC): 170 mg/dL — AB (ref 70–99)

## 2022-10-01 NOTE — ED Triage Notes (Signed)
Patient presents to UC for fatigue and nausea since this morning. Hx of low iron. Has not been taken iron due to change in PCP.   Denies vomiting or diarrhea

## 2022-10-01 NOTE — Discharge Instructions (Signed)
Recommend rest, fluids with electrolytes, food as tolerated.  Go to the ED if your symptoms worsen.  Blood work was taken today and you will be contacted by telephone if results indicate a need for immediate evaluation and treatment.

## 2022-10-01 NOTE — ED Provider Notes (Addendum)
Vanessa Vazquez    CSN: [id number] Arrival date & time: 10/01/22  4098      History   Chief Complaint No chief complaint on file.   HPI Vanessa Vazquez is a 88 y.o. female.   HPI  Presents to urgent care with nonspecific complaint of fatigue with nausea.  Patient states she awoke this morning with feeling of profound fatigue" low energy".  She denies nausea when she awoke which started only when she was in the car and driving to urgent care.  She states she believes her nausea is related to some "fumes" that she encountered while in the car.  Endorses multiple episodes of diarrhea 2 days ago.  She attributes this diarrhea to "something I ate"-specifically some "gravy" that was prepared and eaten at home.  No nausea.  No vomiting.  Denies fever.  No dysuria, hematuria.  No cough, chest pain, shortness of breath.  No abdominal cramping or complaint of discomfort.  No recent change with medication.  Endorses compliance with medication.  Patient was in church yesterday and ate a normal meal with her church family at "home coming".  PMH includes DM 2, IDA.  Past Medical History:  Diagnosis Date   Asthma    Diabetes mellitus without complication (HCC)    GERD (gastroesophageal reflux disease)    Hypercholesteremia    Hypertension     Patient Active Problem List   Diagnosis Date Noted   Diabetes mellitus (HCC) 05/14/2022   Hyperlipidemia 05/14/2022   Arthritis 05/14/2022   Knee capsulitis, left 02/14/2022   Primary osteoarthritis of right knee 05/31/2021   Myalgia due to statin 04/18/2021   Diabetic foot (HCC) 04/18/2021   Need for Tdap vaccination 04/18/2021   Head injury 10/04/2020   Fatigue 02/11/2020   Iron deficiency anemia secondary to inadequate dietary iron intake 01/06/2020   Annual physical exam 01/06/2020   Morbid obesity (HCC) 11/23/2019   Seasonal allergic rhinitis due to pollen 10/21/2019   Type 2 diabetes mellitus without complication, without long-term  current use of insulin (HCC) 10/21/2019   AVM (arteriovenous malformation) of colon with hemorrhage 05/11/2016   Essential (primary) hypertension 05/08/2016   GI bleed 03/17/2016   Vaginal odor 12/14/2015   Vulvar itching 12/14/2015   Vaginal leukorrhea 12/14/2015   Essential hypertension    Hypercholesteremia    Asthma     Past Surgical History:  Procedure Laterality Date   CESAREAN SECTION     COLONOSCOPY WITH PROPOFOL N/A 03/19/2016   Procedure: COLONOSCOPY WITH PROPOFOL;  Surgeon: Scot Jun, MD;  Location: [facility] ENDOSCOPY;  Service: Endoscopy;  Laterality: N/A;    OB History     Gravida  6   Para      Term      Preterm      AB      Living  5      SAB      IAB      Ectopic      Multiple      Live Births  5            Home Medications    Prior to Admission medications   Medication Sig Start Date End Date Taking? Authorizing Provider  albuterol (VENTOLIN HFA) 108 (90 Base) MCG/ACT inhaler Inhale 2 puffs into the lungs every 4 (four) hours as needed for wheezing or shortness of breath. 01/01/22   Willy Eddy, MD  amoxicillin-clavulanate (AUGMENTIN) 500-125 MG tablet Take 1 tablet by mouth in the morning  and at bedtime. 08/30/22   Raspet, Erin K, PA-C  atenolol (TENORMIN) 25 MG tablet TAKE 1 TABLET BY MOUTH ONCE DAILY 02/27/22   Corky Downs, MD  atorvastatin (LIPITOR) 40 MG tablet Take 1 tablet (40 mg total) by mouth daily. 09/12/20   Corky Downs, MD  benzonatate (TESSALON) 100 MG capsule Take 1 capsule (100 mg total) by mouth every 8 (eight) hours. 08/30/22   Raspet, Noberto Retort, PA-C  ferrous sulfate 300 (60 Fe) MG/5ML syrup Take 5 mLs (300 mg total) by mouth daily. 01/06/20   Corky Downs, MD  glimepiride (AMARYL) 2 MG tablet TAKE 1 TABLET BY MOUTH TWICE DAILY 01/02/22   Corky Downs, MD  JANUVIA 100 MG tablet TAKE 1 TABLET BY MOUTH ONCE DAILY 01/22/22   Corky Downs, MD  loratadine (CLARITIN) 10 MG tablet Take 1 tablet (10 mg total) by mouth daily.  06/26/21   Corky Downs, MD  losartan-hydrochlorothiazide (HYZAAR) 50-12.5 MG tablet TAKE 2 TABLETS BY MOUTH ONCE DAILY 11/14/21   Corky Downs, MD  metFORMIN (GLUCOPHAGE) 1000 MG tablet TAKE 1 TABLET BY MOUTH TWICE DAILY 04/20/22   Kara Dies, NP  Misc. Devices (WALKER) MISC 1 each by Does not apply route daily. 10/03/21   Corky Downs, MD  omeprazole (PRILOSEC) 20 MG capsule TAKE 1 CAPSULE BY MOUTH ONCE DAILY 01/02/22   Corky Downs, MD  pregabalin (LYRICA) 25 MG capsule Take 2 capsules (50 mg total) by mouth 2 (two) times daily. 05/31/22   Kara Dies, NP  Respiratory Therapy Supplies (NEBULIZER) DEVI 1 Device by Does not apply route 2 (two) times daily as needed. 11/02/20   Corky Downs, MD    Family History Family History  Problem Relation Age of Onset   Lung cancer Father    Leukemia Sister    Diabetes Sister    Asthma Child     Social History Social History   Tobacco Use   Smoking status: Former   Smokeless tobacco: Never  Substance Use Topics   Alcohol use: No    Alcohol/week: 0.0 standard drinks of alcohol   Drug use: No     Allergies   Patient has no known allergies.   Review of Systems Review of Systems   Physical Exam Triage Vital Signs ED Triage Vitals [10/01/22 0925]  Enc Vitals Group     BP (!) 151/81     Pulse Rate 76     Resp 20     Temp 98.1 F (36.7 C)     Temp Source Oral     SpO2 92 %     Weight      Height      Head Circumference      Peak Flow      Pain Score      Pain Loc      Pain Edu?      Excl. in [place]?    No data found.  Updated Vital Signs BP (!) 151/81 (BP Location: Left Arm)   Pulse 76   Temp 98.1 F (36.7 C) (Oral)   Resp 20   SpO2 92%   Visual Acuity Right Eye Distance:   Left Eye Distance:   Bilateral Distance:    Right Eye Near:   Left Eye Near:    Bilateral Near:     Physical Exam Vitals reviewed.  Constitutional:      Appearance: Normal appearance. She is ill-appearing.  HENT:      Mouth/Throat:     Mouth: Mucous membranes are moist.  Eyes:     Conjunctiva/sclera: Conjunctivae normal.     Pupils: Pupils are equal, round, and reactive to light.  Cardiovascular:     Rate and Rhythm: Normal rate and regular rhythm.     Pulses: Normal pulses.     Heart sounds: Normal heart sounds.  Pulmonary:     Effort: Pulmonary effort is normal.     Breath sounds: Normal breath sounds.  Abdominal:     Palpations: Abdomen is soft.     Tenderness: There is no abdominal tenderness.  Skin:    General: Skin is warm and dry.  Neurological:     General: No focal deficit present.     Mental Status: She is alert and oriented to person, place, and time.  Psychiatric:        Mood and Affect: Mood normal.        Behavior: Behavior normal.      UC Treatments / Results  Labs (all labs ordered are listed, but only abnormal results are displayed) Labs Reviewed - No data to display  EKG   Radiology No results found.  Procedures Procedures (including critical care time)  Medications Ordered in UC Medications - No data to display  Initial Impression / Assessment and Plan / UC Course  I have reviewed the triage vital signs and the nursing notes.  Pertinent labs & imaging results that were available during my care of the patient were reviewed by me and considered in my medical decision making (see chart for details).   Vanessa Vazquez is a 88 y.o. female presenting with fatigue, nausea. Patient is afebrile without recent antipyretics, satting well on room air. Overall is ill appearing though non-toxic, well hydrated, without respiratory distress. Pulmonary exam is unremarkable.  Lungs CTAB without wheezing, rhonchi, rales.  Non-specific symptoms of nausea and fatigue.  DDx includes, but is not limited to, anemia, AKI, electrolyte abnormality, UTI, pneumonia, ACS.  No thyroid labs today as her recent TSH was WNL.  No focal neurologic deficits on exam.  Unable to perform chest x-ray  as patient is unable to stand unattended in limited x-ray lab.  Will draw CBC and BMP however will inform patient of results by telephone given limited lab resources in this clinic.  Will check UA and ECG.  ECG is reassuring with normal sinus and left bundle branch block, similar to recent ECG in the ED on 05/19/2022  UA is WNL and not consistent with urinary tract infection.  Presumed viral process paren in the context of her recent episodes of diarrhea) and will discharge patient with recommendation for rest, fluids and food as tolerated.  Discussed differential diagnoses with the patient and daughter present.  Specifically discussed ED precautions for worsening symptoms.  Labs are pending and patient will be called if results indicate need for emergent evaluation and treatment.  Counseled patient on potential for adverse effects with medications prescribed/recommended today, ER and return-to-clinic precautions discussed, patient verbalized understanding and agreement with care plan.   Final Clinical Impressions(s) / UC Diagnoses   Final diagnoses:  None   Discharge Instructions   None    ED Prescriptions   None    PDMP not reviewed this encounter.   Charma Igo, FNP 10/01/22 1020    ImmordinoJeannett Senior, FNP 10/01/22 1022

## 2022-10-02 LAB — CBC WITH DIFFERENTIAL/PLATELET
Basophils Absolute: 0.1 10*3/uL (ref 0.0–0.2)
Basos: 1 %
EOS (ABSOLUTE): 0 10*3/uL (ref 0.0–0.4)
Eos: 1 %
Hematocrit: 34.1 % (ref 34.0–46.6)
Hemoglobin: 11.6 g/dL (ref 11.1–15.9)
Immature Grans (Abs): 0 10*3/uL (ref 0.0–0.1)
Immature Granulocytes: 0 %
Lymphocytes Absolute: 1.6 10*3/uL (ref 0.7–3.1)
Lymphs: 26 %
MCH: 31.8 pg (ref 26.6–33.0)
MCHC: 34 g/dL (ref 31.5–35.7)
MCV: 93 fL (ref 79–97)
Monocytes Absolute: 0.4 10*3/uL (ref 0.1–0.9)
Monocytes: 7 %
Neutrophils Absolute: 4 10*3/uL (ref 1.4–7.0)
Neutrophils: 65 %
Platelets: 208 10*3/uL (ref 150–450)
RBC: 3.65 x10E6/uL — ABNORMAL LOW (ref 3.77–5.28)
RDW: 11.9 % (ref 11.7–15.4)
WBC: 6.1 10*3/uL (ref 3.4–10.8)

## 2022-10-02 LAB — BASIC METABOLIC PANEL
BUN/Creatinine Ratio: 24 (ref 12–28)
BUN: 22 mg/dL (ref 8–27)
CO2: 21 mmol/L (ref 20–29)
Calcium: 10.6 mg/dL — ABNORMAL HIGH (ref 8.7–10.3)
Chloride: 105 mmol/L (ref 96–106)
Creatinine, Ser: 0.92 mg/dL (ref 0.57–1.00)
Glucose: 139 mg/dL — ABNORMAL HIGH (ref 70–99)
Potassium: 4.1 mmol/L (ref 3.5–5.2)
Sodium: 140 mmol/L (ref 134–144)
eGFR: 61 mL/min/{1.73_m2} (ref >59)

## 2022-10-19 ENCOUNTER — Telehealth: Payer: Self-pay | Admitting: Physician Assistant

## 2022-10-22 NOTE — Progress Notes (Unsigned)
New patient visit  Patient: Vanessa Vazquez   DOB: 19-May-1935   88 y.o. Female  MRN: [id number] Visit Date: 10/23/2022  Today's healthcare provider: Debera Lat, PA-C   No chief complaint on file.  Subjective    Vanessa Vazquez is a 88 y.o. female who presents today as a new patient to establish care.  HPI  Foot exam Urine ACR Tdap Shingrix Pneumonia  Past Medical History:  Diagnosis Date   Asthma    Diabetes mellitus without complication (HCC)    GERD (gastroesophageal reflux disease)    Hypercholesteremia    Hypertension    Past Surgical History:  Procedure Laterality Date   CESAREAN SECTION     COLONOSCOPY WITH PROPOFOL N/A 03/19/2016   Procedure: COLONOSCOPY WITH PROPOFOL;  Surgeon: Scot Jun, MD;  Location: [facility] ENDOSCOPY;  Service: Endoscopy;  Laterality: N/A;   Family Status  Relation Name Status   Mother  Alive   Father  Alive   Sister  Deceased   Brother  Deceased   Brother  Alive   Son  Alive   Daughter  Alive   Son  Deceased   MGM  Deceased   MGF  Deceased   PGM  Deceased   PGF  Deceased   Sister  (Not Specified)   Sister  (Not Specified)   Child  (Not Specified)   Family History  Problem Relation Age of Onset   Lung cancer Father    Leukemia Sister    Diabetes Sister    Asthma Child    Social History   Socioeconomic History   Marital status: Widowed    Spouse name: Not on file   Number of children: Not on file   Years of education: Not on file   Highest education level: GED or equivalent  Occupational History   Not on file  Tobacco Use   Smoking status: Former   Smokeless tobacco: Never  Substance and Sexual Activity   Alcohol use: No    Alcohol/week: 0.0 standard drinks of alcohol   Drug use: No   Sexual activity: Never  Other Topics Concern   Not on file  Social History Narrative   Not on file   Social Determinants of Health   Financial Resource Strain: Low Risk  (05/14/2022)   Overall Financial Resource Strain  (CARDIA)    Difficulty of Paying Living Expenses: Not hard at all  Food Insecurity: No Food Insecurity (05/14/2022)   Hunger Vital Sign    Worried About Running Out of Food in the Last Year: Never true    Ran Out of Food in the Last Year: Never true  Transportation Needs: No Transportation Needs (05/14/2022)   PRAPARE - Administrator, Civil Service (Medical): No    Lack of Transportation (Non-Medical): No  Physical Activity: Inactive (05/14/2022)   Exercise Vital Sign    Days of Exercise per Week: 0 days    Minutes of Exercise per Session: 0 min  Stress: No Stress Concern Present (05/14/2022)   [employer] of Occupational Health - Occupational Stress Questionnaire    Feeling of Stress : Not at all  Social Connections: Socially Isolated (05/14/2022)   Social Connection and Isolation Panel [NHANES]    Frequency of Communication with Friends and Family: Twice a week    Frequency of Social Gatherings with Friends and Family: Never    Attends Religious Services: More than 4 times per year    Active Member of Clubs or Organizations: No  Attends Banker Meetings: Never    Marital Status: Widowed   Outpatient Medications Prior to Visit  Medication Sig   albuterol (VENTOLIN HFA) 108 (90 Base) MCG/ACT inhaler Inhale 2 puffs into the lungs every 4 (four) hours as needed for wheezing or shortness of breath.   amoxicillin-clavulanate (AUGMENTIN) 500-125 MG tablet Take 1 tablet by mouth in the morning and at bedtime.   atenolol (TENORMIN) 25 MG tablet TAKE 1 TABLET BY MOUTH ONCE DAILY   atorvastatin (LIPITOR) 40 MG tablet Take 1 tablet (40 mg total) by mouth daily.   benzonatate (TESSALON) 100 MG capsule Take 1 capsule (100 mg total) by mouth every 8 (eight) hours.   ferrous sulfate 300 (60 Fe) MG/5ML syrup Take 5 mLs (300 mg total) by mouth daily.   glimepiride (AMARYL) 2 MG tablet TAKE 1 TABLET BY MOUTH TWICE DAILY   JANUVIA 100 MG tablet TAKE 1 TABLET BY MOUTH  ONCE DAILY   loratadine (CLARITIN) 10 MG tablet Take 1 tablet (10 mg total) by mouth daily.   losartan-hydrochlorothiazide (HYZAAR) 50-12.5 MG tablet TAKE 2 TABLETS BY MOUTH ONCE DAILY   metFORMIN (GLUCOPHAGE) 1000 MG tablet TAKE 1 TABLET BY MOUTH TWICE DAILY   Misc. Devices (WALKER) MISC 1 each by Does not apply route daily.   omeprazole (PRILOSEC) 20 MG capsule TAKE 1 CAPSULE BY MOUTH ONCE DAILY   pregabalin (LYRICA) 25 MG capsule Take 2 capsules (50 mg total) by mouth 2 (two) times daily.   Respiratory Therapy Supplies (NEBULIZER) DEVI 1 Device by Does not apply route 2 (two) times daily as needed.   No facility-administered medications prior to visit.   No Known Allergies  Immunization History  Administered Date(s) Administered   Influenza, Seasonal, Injecte, Preservative Fre 04/23/2006, 03/25/2007, 03/10/2009, 03/07/2010   Pneumococcal Polysaccharide-23 04/12/2003    Health Maintenance  Topic Date Due   COVID-19 Vaccine (1) Never done   FOOT EXAM  Never done   Diabetic kidney evaluation - Urine ACR  Never done   DTaP/Tdap/Td (1 - Tdap) Never done   Zoster Vaccines- Shingrix (1 of 2) Never done   Pneumonia Vaccine 64+ Years old (2 of 2 - PCV) 04/11/2004   HEMOGLOBIN A1C  07/19/2022   OPHTHALMOLOGY EXAM  11/16/2022   Medicare Annual Wellness (AWV)  05/15/2023   Diabetic kidney evaluation - eGFR measurement  10/01/2023   DEXA SCAN  Completed   HPV VACCINES  Aged Out   INFLUENZA VACCINE  Discontinued    Patient Care Team: Corky Downs, MD as PCP - General (Internal Medicine)  Review of Systems  All other systems reviewed and are negative. Except see HPI   {Labs  Heme  Chem  Endocrine  Serology  Results Review (optional):23779}   Objective    There were no vitals taken for this visit. {Show previous vital signs (optional):23777}  Physical Exam Vitals reviewed.  Constitutional:      General: She is not in acute distress.    Appearance: Normal appearance. She  is well-developed. She is not diaphoretic.  HENT:     Head: Normocephalic and atraumatic.  Eyes:     General: No scleral icterus.    Conjunctiva/sclera: Conjunctivae normal.  Neck:     Thyroid: No thyromegaly.  Cardiovascular:     Rate and Rhythm: Normal rate and regular rhythm.     Pulses: Normal pulses.     Heart sounds: Normal heart sounds. No murmur heard. Pulmonary:     Effort: Pulmonary effort is normal. No  respiratory distress.     Breath sounds: Normal breath sounds. No wheezing, rhonchi or rales.  Musculoskeletal:     Cervical back: Neck supple.     Right lower leg: No edema.     Left lower leg: No edema.  Lymphadenopathy:     Cervical: No cervical adenopathy.  Skin:    General: Skin is warm and dry.     Findings: No rash.  Neurological:     Mental Status: She is alert and oriented to person, place, and time. Mental status is at baseline.  Psychiatric:        Mood and Affect: Mood normal.        Behavior: Behavior normal.     Depression Screen    05/14/2022    9:48 AM 07/31/2021   10:02 AM 05/11/2021   10:07 AM 04/18/2021    8:57 AM  PHQ 2/9 Scores  PHQ - 2 Score 0 0 0 0  PHQ- 9 Score 0      No results found for any visits on 10/23/22.  Assessment & Plan     *** Encounter to establish care Welcomed to our clinic Reviewed past medical hx, social hx, family hx and surgical hx Pt advised to send all vaccination records or screening   No follow-ups on file.    The patient was advised to call back or seek an in-person evaluation if the symptoms worsen or if the condition fails to improve as anticipated.  I discussed the assessment and treatment plan with the patient. The patient was provided an opportunity to ask questions and all were answered. The patient agreed with the plan and demonstrated an understanding of the instructions.  I, Debera Lat, PA-C have reviewed all documentation for this visit. The documentation on 10/23/22 for the exam, diagnosis,  procedures, and orders are all accurate and complete.  Debera Lat, [facility], MMS [facility] [phone] (phone) [phone] (fax)  [facility]

## 2022-10-23 ENCOUNTER — Ambulatory Visit (INDEPENDENT_AMBULATORY_CARE_PROVIDER_SITE_OTHER): Payer: 59 | Admitting: Physician Assistant

## 2022-10-23 ENCOUNTER — Encounter: Payer: Self-pay | Admitting: Physician Assistant

## 2022-10-23 VITALS — BP 137/58 | HR 72 | Ht 63.0 in | Wt 223.0 lb

## 2022-10-23 DIAGNOSIS — D508 Other iron deficiency anemias: Secondary | ICD-10-CM | POA: Diagnosis not present

## 2022-10-23 DIAGNOSIS — E118 Type 2 diabetes mellitus with unspecified complications: Secondary | ICD-10-CM | POA: Diagnosis not present

## 2022-10-23 DIAGNOSIS — R5383 Other fatigue: Secondary | ICD-10-CM | POA: Diagnosis not present

## 2022-10-23 DIAGNOSIS — M76892 Other specified enthesopathies of left lower limb, excluding foot: Secondary | ICD-10-CM | POA: Diagnosis not present

## 2022-10-23 DIAGNOSIS — J4520 Mild intermittent asthma, uncomplicated: Secondary | ICD-10-CM

## 2022-10-23 DIAGNOSIS — M1711 Unilateral primary osteoarthritis, right knee: Secondary | ICD-10-CM | POA: Diagnosis not present

## 2022-10-23 DIAGNOSIS — I10 Essential (primary) hypertension: Secondary | ICD-10-CM

## 2022-10-23 DIAGNOSIS — J301 Allergic rhinitis due to pollen: Secondary | ICD-10-CM

## 2022-10-23 DIAGNOSIS — E119 Type 2 diabetes mellitus without complications: Secondary | ICD-10-CM

## 2022-10-23 DIAGNOSIS — K5521 Angiodysplasia of colon with hemorrhage: Secondary | ICD-10-CM

## 2022-10-23 DIAGNOSIS — E7800 Pure hypercholesterolemia, unspecified: Secondary | ICD-10-CM

## 2022-10-23 DIAGNOSIS — E7849 Other hyperlipidemia: Secondary | ICD-10-CM | POA: Diagnosis not present

## 2022-10-23 DIAGNOSIS — M545 Low back pain, unspecified: Secondary | ICD-10-CM

## 2022-10-23 DIAGNOSIS — M1990 Unspecified osteoarthritis, unspecified site: Secondary | ICD-10-CM

## 2022-10-23 MED ORDER — GABAPENTIN 100 MG PO CAPS
ORAL_CAPSULE | ORAL | 1 refills | Status: DC
Start: 2022-10-23 — End: 2022-11-28

## 2022-10-23 NOTE — Assessment & Plan Note (Signed)
Chronic? Last lipid panel was within normal limits Does not take any medications Per chart review from 05/2022, she has been taking atorvastatin 40 Mg daily It was canceled due to myalgia? Will recheck with patient at his next appointment Advised low-cholesterol diet and regular exercise Will update LP Will reassess after receiving lab results

## 2022-10-23 NOTE — Assessment & Plan Note (Signed)
Chronic and previously stable His blood pressure today was 157/58 Patient denies shortness of breath, chest pain, palpitation EKG showed vent rate of 69, normal sinus rhythm with sinus arrhythmia, left axis deviation, left bundle branch block.  EKG findings are similar to the ones from December 2023 which is reassuring Continue taking atenolol 25 Mg, losartan-hydrochlorothiazide 50-12 0.5 Mg

## 2022-10-23 NOTE — Assessment & Plan Note (Signed)
Chronic and previously stable Has been taking albuterol inhaler and nebulizer as needed Will recheck at her next appointment

## 2022-10-23 NOTE — Assessment & Plan Note (Signed)
Chronic, most likely multifactorial Initial workup  was ordered  Will reassess after receiving lab results

## 2022-10-23 NOTE — Assessment & Plan Note (Addendum)
Chronic and stable Current BMI 39.50 Associated with diabetes type 2, hypertension, hyperlipidemia Advised weight loss via healthy diet and regular exercise/physical activities as tolerated Will need HH/physical therapy Will address at her next appointment Will follow-up

## 2022-10-23 NOTE — Assessment & Plan Note (Addendum)
Chronic and previously stable Last A1c was 4.7 Continue taking glimepiride 2 Mg, Januvia 100 Mg, losartan-hydrochlorothiazide 50-12.5 Mg, metformin thousand mg Continue low-carb diet and daily exercise Will reassess after receiving lab results

## 2022-10-24 ENCOUNTER — Encounter: Payer: Self-pay | Admitting: Physician Assistant

## 2022-10-24 DIAGNOSIS — M545 Low back pain, unspecified: Secondary | ICD-10-CM | POA: Insufficient documentation

## 2022-10-24 LAB — COMPREHENSIVE METABOLIC PANEL
ALT: 20 IU/L (ref 0–32)
AST: 22 IU/L (ref 0–40)
Albumin/Globulin Ratio: 1.3 (ref 1.2–2.2)
Albumin: 4.1 g/dL (ref 3.7–4.7)
Alkaline Phosphatase: 101 IU/L (ref 44–121)
BUN/Creatinine Ratio: 24 (ref 12–28)
BUN: 25 mg/dL (ref 8–27)
Bilirubin Total: 0.4 mg/dL (ref 0.0–1.2)
CO2: 24 mmol/L (ref 20–29)
Calcium: 10.4 mg/dL — ABNORMAL HIGH (ref 8.7–10.3)
Chloride: 103 mmol/L (ref 96–106)
Creatinine, Ser: 1.05 mg/dL — ABNORMAL HIGH (ref 0.57–1.00)
Globulin, Total: 3.1 g/dL (ref 1.5–4.5)
Glucose: 68 mg/dL — ABNORMAL LOW (ref 70–99)
Potassium: 4.3 mmol/L (ref 3.5–5.2)
Sodium: 141 mmol/L (ref 134–144)
Total Protein: 7.2 g/dL (ref 6.0–8.5)
eGFR: 52 mL/min/{1.73_m2} — ABNORMAL LOW (ref >59)

## 2022-10-24 LAB — CBC WITH DIFFERENTIAL/PLATELET
Basophils Absolute: 0.1 10*3/uL (ref 0.0–0.2)
Basos: 1 %
EOS (ABSOLUTE): 0.2 10*3/uL (ref 0.0–0.4)
Eos: 2 %
Hematocrit: 34.9 % (ref 34.0–46.6)
Hemoglobin: 11.9 g/dL (ref 11.1–15.9)
Immature Grans (Abs): 0 10*3/uL (ref 0.0–0.1)
Immature Granulocytes: 0 %
Lymphocytes Absolute: 2.7 10*3/uL (ref 0.7–3.1)
Lymphs: 40 %
MCH: 31.9 pg (ref 26.6–33.0)
MCHC: 34.1 g/dL (ref 31.5–35.7)
MCV: 94 fL (ref 79–97)
Monocytes Absolute: 0.7 10*3/uL (ref 0.1–0.9)
Monocytes: 10 %
Neutrophils Absolute: 3.3 10*3/uL (ref 1.4–7.0)
Neutrophils: 47 %
Platelets: 223 10*3/uL (ref 150–450)
RBC: 3.73 x10E6/uL — ABNORMAL LOW (ref 3.77–5.28)
RDW: 12.1 % (ref 11.7–15.4)
WBC: 6.9 10*3/uL (ref 3.4–10.8)

## 2022-10-24 LAB — HEMOGLOBIN A1C
Est. average glucose Bld gHb Est-mCnc: 126 mg/dL
Hgb A1c MFr Bld: 6 % — ABNORMAL HIGH (ref 4.8–5.6)

## 2022-10-24 LAB — LIPID PANEL
Chol/HDL Ratio: 5 ratio — ABNORMAL HIGH (ref 0.0–4.4)
Cholesterol, Total: 297 mg/dL — ABNORMAL HIGH (ref 100–199)
HDL: 59 mg/dL (ref >39)
LDL Chol Calc (NIH): 195 mg/dL — ABNORMAL HIGH (ref 0–99)
Triglycerides: 224 mg/dL — ABNORMAL HIGH (ref 0–149)
VLDL Cholesterol Cal: 43 mg/dL — ABNORMAL HIGH (ref 5–40)

## 2022-10-24 LAB — TSH: TSH: 0.727 u[IU]/mL (ref 0.450–4.500)

## 2022-10-24 NOTE — Assessment & Plan Note (Signed)
Has been suffering from chronic knee pain, per patient 8 months ago, she should have steroid injection Will reassess at her next appointment

## 2022-10-24 NOTE — Assessment & Plan Note (Signed)
Ordered updated CBC

## 2022-10-24 NOTE — Assessment & Plan Note (Addendum)
Stable due to stable diabetes In the setting of chronic leg swelling/venous insufficiency Will recheck at her next appointment

## 2022-10-24 NOTE — Assessment & Plan Note (Signed)
The patient reports that it is a chronic condition She has been having trouble with knees and with lower back for some time Per chart review, x-rays shows mild degenerative changes in the lumbar spine  She has been taking Mobic, refused to see Ortho Will reassess

## 2022-10-24 NOTE — Assessment & Plan Note (Signed)
Stable continue to take loratadine daily

## 2022-10-24 NOTE — Assessment & Plan Note (Deleted)
The patient reports that it is a chronic condition She has been having trouble with knees and with lower back

## 2022-10-24 NOTE — Assessment & Plan Note (Signed)
Stable, 5 months ago was referred to Ortho Has been receiving steroid injection from old PCP Patient has been "trouble getting up from chair and from bed" Will refer to Ortho for pain management

## 2022-10-26 ENCOUNTER — Telehealth: Payer: Self-pay

## 2022-10-26 NOTE — Telephone Encounter (Signed)
Copied from CRM [id number]. Topic: General - Inquiry >> Oct 26, 2022 10:03 AM De Blanch wrote: Reason for CRM: [facility] called in, stated the patient was there, and was advised by PCP Ms.Ostwalt she needed to go see Dr.Carol for back pain. Urology stated pt is at the wrong location; they don't have a Dr.Carol or an appointment for the patient. A volunteer went to [facility] to see if pt was scheduled to be seen there. They advised the volunteer that they did not have a Dr.Carol and no appointment for the patient today.   The appointment was at 9:40 AM, and pt had now missed the appointment. Family member helping pt provided callback: [phone]  Please advice.

## 2022-11-02 ENCOUNTER — Other Ambulatory Visit: Payer: Self-pay | Admitting: Physician Assistant

## 2022-11-02 ENCOUNTER — Telehealth: Payer: Self-pay

## 2022-11-02 DIAGNOSIS — E118 Type 2 diabetes mellitus with unspecified complications: Secondary | ICD-10-CM

## 2022-11-02 DIAGNOSIS — M79673 Pain in unspecified foot: Secondary | ICD-10-CM

## 2022-11-02 NOTE — Telephone Encounter (Signed)
Copied from CRM [id number]. Topic: Referral - Request for Referral >> Nov 02, 2022 10:41 AM Everette C wrote: Has patient seen PCP for this complaint? Yes.   *If NO, is insurance requiring patient see PCP for this issue before PCP can refer them? Referral for which specialty: Podiatry  Preferred provider/office: Patient has no preference  Reason for referral: Foot discomfort

## 2022-11-05 NOTE — Progress Notes (Unsigned)
Vivien Rota DeSanto,acting as a Neurosurgeon for [employer], PA-C.,have documented all relevant documentation on the behalf of Debera Lat, PA-C,as directed by  [employer], PA-C while in the presence of [employer], PA-C.     Established patient visit   Patient: Vanessa Vazquez   DOB: Jun 07, 1934   88 y.o. Female  MRN: [id number] Visit Date: 11/06/2022  Today's healthcare provider: Debera Lat, PA-C   No chief complaint on file.  Subjective    HPI  Hypertension, follow-up  BP Readings from Last 3 Encounters:  10/23/22 (!) 137/58  10/01/22 (!) 151/81  08/30/22 (!) 148/76   Wt Readings from Last 3 Encounters:  10/23/22 223 lb (101.2 kg)  06/05/22 228 lb (103.4 kg)  05/19/22 232 lb 5.8 oz (105.4 kg)     She was last seen for hypertension 2 weeks ago.  BP at that visit was as above. Management since that visit includes no changes.  She reports {excellent/good/fair/poor:19665} compliance with treatment. She {is/is not:9024} having side effects. {document side effects if present:1} She is following a {diet:21022986} diet. She {is/is not:9024} exercising. She {does/does not:200015} smoke.  Use of agents associated with hypertension: {bp agents assoc with hypertension:511::"none"}.   Outside blood pressures are {***enter patient reported home BP readings, or 'not being checked':1}. Symptoms: {Yes/No:20286} chest pain {Yes/No:20286} chest pressure  {Yes/No:20286} palpitations {Yes/No:20286} syncope  {Yes/No:20286} dyspnea {Yes/No:20286} orthopnea  {Yes/No:20286} paroxysmal nocturnal dyspnea {Yes/No:20286} lower extremity edema   Pertinent labs Lab Results  Component Value Date   CHOL 297 (H) 10/23/2022   HDL 59 10/23/2022   LDLCALC 195 (H) 10/23/2022   TRIG 224 (H) 10/23/2022   CHOLHDL 5.0 (H) 10/23/2022   Lab Results  Component Value Date   NA 141 10/23/2022   K 4.3 10/23/2022   CREATININE 1.05 (H) 10/23/2022   EGFR 52 (L) 10/23/2022   GLUCOSE 68 (L) 10/23/2022    TSH 0.727 10/23/2022     The ASCVD Risk score (Arnett DK, et al., 2019) failed to calculate for the following reasons:   The 2019 ASCVD risk score is only valid for ages 27 to 89  ---------------------------------------------------------------------------------------------------   Medications: Outpatient Medications Prior to Visit  Medication Sig   albuterol (VENTOLIN HFA) 108 (90 Base) MCG/ACT inhaler Inhale 2 puffs into the lungs every 4 (four) hours as needed for wheezing or shortness of breath.   atenolol (TENORMIN) 25 MG tablet TAKE 1 TABLET BY MOUTH ONCE DAILY   ferrous sulfate 300 (60 Fe) MG/5ML syrup Take 5 mLs (300 mg total) by mouth daily. (Patient not taking: Reported on 10/23/2022)   gabapentin (NEURONTIN) 100 MG capsule 1 capsule once daily   glimepiride (AMARYL) 2 MG tablet TAKE 1 TABLET BY MOUTH TWICE DAILY   JANUVIA 100 MG tablet TAKE 1 TABLET BY MOUTH ONCE DAILY   loratadine (CLARITIN) 10 MG tablet Take 1 tablet (10 mg total) by mouth daily.   losartan-hydrochlorothiazide (HYZAAR) 50-12.5 MG tablet TAKE 2 TABLETS BY MOUTH ONCE DAILY   metFORMIN (GLUCOPHAGE) 1000 MG tablet TAKE 1 TABLET BY MOUTH TWICE DAILY   Misc. Devices (WALKER) MISC 1 each by Does not apply route daily.   omeprazole (PRILOSEC) 20 MG capsule TAKE 1 CAPSULE BY MOUTH ONCE DAILY   Respiratory Therapy Supplies (NEBULIZER) DEVI 1 Device by Does not apply route 2 (two) times daily as needed.   No facility-administered medications prior to visit.    Review of Systems  {Labs  Heme  Chem  Endocrine  Serology  Results Review (  optional):23779}   Objective    There were no vitals taken for this visit. {Show previous vital signs (optional):23777}  Physical Exam  ***  No results found for any visits on 11/06/22.  Assessment & Plan     ***  No follow-ups on file.      {provider attestation***:1}   Debera Lat, PA-C  [facility] [phone]  (phone) [phone] (fax)  [facility]

## 2022-11-06 ENCOUNTER — Ambulatory Visit (INDEPENDENT_AMBULATORY_CARE_PROVIDER_SITE_OTHER): Payer: 59 | Admitting: Physician Assistant

## 2022-11-06 ENCOUNTER — Encounter: Payer: Self-pay | Admitting: Physician Assistant

## 2022-11-06 VITALS — BP 123/52 | HR 76 | Temp 97.9°F | Wt 233.0 lb

## 2022-11-06 DIAGNOSIS — I10 Essential (primary) hypertension: Secondary | ICD-10-CM | POA: Diagnosis not present

## 2022-11-06 DIAGNOSIS — E7849 Other hyperlipidemia: Secondary | ICD-10-CM

## 2022-11-06 DIAGNOSIS — E119 Type 2 diabetes mellitus without complications: Secondary | ICD-10-CM

## 2022-11-06 DIAGNOSIS — N1831 Chronic kidney disease, stage 3a: Secondary | ICD-10-CM | POA: Diagnosis not present

## 2022-11-06 DIAGNOSIS — D508 Other iron deficiency anemias: Secondary | ICD-10-CM

## 2022-11-06 DIAGNOSIS — E118 Type 2 diabetes mellitus with unspecified complications: Secondary | ICD-10-CM

## 2022-11-06 DIAGNOSIS — N183 Chronic kidney disease, stage 3 unspecified: Secondary | ICD-10-CM | POA: Insufficient documentation

## 2022-11-06 DIAGNOSIS — J4520 Mild intermittent asthma, uncomplicated: Secondary | ICD-10-CM | POA: Diagnosis not present

## 2022-11-06 MED ORDER — ROSUVASTATIN CALCIUM 5 MG PO TABS
5.0000 mg | ORAL_TABLET | Freq: Every day | ORAL | 3 refills | Status: DC
Start: 2022-11-06 — End: 2023-12-12

## 2022-11-06 MED ORDER — ROSUVASTATIN CALCIUM 5 MG PO TABS
5.0000 mg | ORAL_TABLET | Freq: Every day | ORAL | 3 refills | Status: DC
Start: 2022-11-06 — End: 2022-11-06

## 2022-11-13 ENCOUNTER — Ambulatory Visit: Payer: 59 | Admitting: Podiatry

## 2022-11-14 ENCOUNTER — Telehealth: Payer: Self-pay | Admitting: Physician Assistant

## 2022-11-14 ENCOUNTER — Telehealth: Payer: Self-pay | Admitting: *Deleted

## 2022-11-14 NOTE — Telephone Encounter (Signed)
Pt requesting refill of Lyrica, not on current med profile. States she has been out 2 days. Reports LRF was not by Dr. Juel Burrow as previously.  LRF noted was 05/31/22  #120  0 refills by Vallery Ridge NP. Pt states not sure who prescribed last refill. States pharmacy advised to call PCP. Please review. Thank you.   Copied from CRM [id number]. Topic: General - Other >> Nov 14, 2022  1:00 PM Everette C wrote: Reason for CRM: Medication Refill - Medication: pregabalin (LYRICA) 50 MG capsule [[id number]]   Has the patient contacted their pharmacy? Yes.   (Agent: If no, request that the patient contact the pharmacy for the refill. If patient does not wish to contact the pharmacy document the reason why and proceed with request.) (Agent: If yes, when and what did the pharmacy advise?)   Preferred Pharmacy (with phone number or street name): [facility] [place] [place] - [address] [place] [address] Phone: [phone] Fax: [phone] Hours: Not open 24 hours

## 2022-11-14 NOTE — Telephone Encounter (Unsigned)
Copied from CRM [id number]. Topic: General - Other >> Nov 14, 2022  1:00 PM Everette C wrote: Reason for CRM: Medication Refill - Medication: pregabalin (LYRICA) 50 MG capsule [[id number]]  Has the patient contacted their pharmacy? Yes.   (Agent: If no, request that the patient contact the pharmacy for the refill. If patient does not wish to contact the pharmacy document the reason why and proceed with request.) (Agent: If yes, when and what did the pharmacy advise?)  Preferred Pharmacy (with phone number or street name): [facility] [place], [place] - [address] [place] [address] Phone: [phone] Fax: [phone] Hours: Not open 24 hours   Has the patient been seen for an appointment in the last year OR does the patient have an upcoming appointment? Yes.    Agent: Please be advised that RX refills may take up to 3 business days. We ask that you follow-up with your pharmacy.

## 2022-11-15 NOTE — Telephone Encounter (Signed)
Patient advised. She reports she is taking Gabapentin. Disregard refill request for lyrica.

## 2022-11-16 ENCOUNTER — Emergency Department: Payer: 59

## 2022-11-16 ENCOUNTER — Encounter: Payer: Self-pay | Admitting: Emergency Medicine

## 2022-11-16 ENCOUNTER — Other Ambulatory Visit: Payer: Self-pay

## 2022-11-16 ENCOUNTER — Emergency Department
Admission: EM | Admit: 2022-11-16 | Discharge: 2022-11-16 | Disposition: A | Discharge status: Home / Self Care | Payer: 59 | Attending: Student in an Organized Health Care Education/Training Program | Admitting: Student in an Organized Health Care Education/Training Program

## 2022-11-16 DIAGNOSIS — J45909 Unspecified asthma, uncomplicated: Secondary | ICD-10-CM | POA: Insufficient documentation

## 2022-11-16 DIAGNOSIS — R531 Weakness: Secondary | ICD-10-CM | POA: Diagnosis not present

## 2022-11-16 DIAGNOSIS — R4182 Altered mental status, unspecified: Secondary | ICD-10-CM | POA: Insufficient documentation

## 2022-11-16 DIAGNOSIS — I10 Essential (primary) hypertension: Secondary | ICD-10-CM | POA: Diagnosis not present

## 2022-11-16 DIAGNOSIS — E119 Type 2 diabetes mellitus without complications: Secondary | ICD-10-CM | POA: Insufficient documentation

## 2022-11-16 DIAGNOSIS — R001 Bradycardia, unspecified: Secondary | ICD-10-CM | POA: Diagnosis not present

## 2022-11-16 DIAGNOSIS — Z79899 Other long term (current) drug therapy: Secondary | ICD-10-CM | POA: Diagnosis not present

## 2022-11-16 DIAGNOSIS — I447 Left bundle-branch block, unspecified: Secondary | ICD-10-CM | POA: Insufficient documentation

## 2022-11-16 DIAGNOSIS — R5381 Other malaise: Secondary | ICD-10-CM | POA: Diagnosis not present

## 2022-11-16 LAB — BASIC METABOLIC PANEL
Anion gap: 8 (ref 5–15)
BUN: 27 mg/dL — ABNORMAL HIGH (ref 8–23)
CO2: 24 mmol/L (ref 22–32)
Calcium: 9.5 mg/dL (ref 8.9–10.3)
Chloride: 103 mmol/L (ref 98–111)
Creatinine, Ser: 1.09 mg/dL — ABNORMAL HIGH (ref 0.44–1.00)
GFR, Estimated: 50 mL/min — ABNORMAL LOW (ref >60)
Glucose, Bld: 192 mg/dL — ABNORMAL HIGH (ref 70–99)
Potassium: 3.7 mmol/L (ref 3.5–5.1)
Sodium: 135 mmol/L (ref 135–145)

## 2022-11-16 LAB — CBC
HCT: 39.2 % (ref 36.0–46.0)
Hemoglobin: 12.3 g/dL (ref 12.0–15.0)
MCH: 30.4 pg (ref 26.0–34.0)
MCHC: 31.4 g/dL (ref 30.0–36.0)
MCV: 97 fL (ref 80.0–100.0)
Platelets: 212 10*3/uL (ref 150–400)
RBC: 4.04 MIL/uL (ref 3.87–5.11)
RDW: 12.6 % (ref 11.5–15.5)
WBC: 4.5 10*3/uL (ref 4.0–10.5)
nRBC: 0 % (ref 0.0–0.2)

## 2022-11-16 LAB — URINALYSIS, ROUTINE W REFLEX MICROSCOPIC
Bacteria, UA: NONE SEEN
Bilirubin Urine: NEGATIVE
Glucose, UA: NEGATIVE mg/dL
Hgb urine dipstick: NEGATIVE
Ketones, ur: NEGATIVE mg/dL
Nitrite: NEGATIVE
Protein, ur: NEGATIVE mg/dL
Specific Gravity, Urine: 1.012 (ref 1.005–1.030)
pH: 5 (ref 5.0–8.0)

## 2022-11-16 LAB — TROPONIN I (HIGH SENSITIVITY)
Troponin I (High Sensitivity): 6 ng/L (ref <18)
Troponin I (High Sensitivity): 6 ng/L (ref <18)

## 2022-11-16 LAB — CBG MONITORING, ED: Glucose-Capillary: 178 mg/dL — ABNORMAL HIGH (ref 70–99)

## 2022-11-16 MED ORDER — SODIUM CHLORIDE 0.9 % IV BOLUS
500.0000 mL | Freq: Once | INTRAVENOUS | Status: AC
  Administered 2022-11-16: 500 mL via INTRAVENOUS

## 2022-11-16 NOTE — ED Triage Notes (Signed)
Pt sts that she woke up and felt like she was going to pass out. Pt sts that she was sitting at the kitchen table and than started to feel really sick.

## 2022-11-16 NOTE — ED Provider Notes (Signed)
[facility] Provider Note    Event Date/Time   First MD Initiated Contact with Patient 11/16/22 1230     (approximate)   History   Weakness   HPI  Vanessa Vazquez is a 88 y.o. female history of asthma diabetes high cholesterol hypertension presents to the ER for evaluation of generalized malaise.  This been ongoing for the past couple days.  Did start an cholesterol medication.  She is been compliant with her medications.  Denies any melena no hematochezia.  States that she was at the kitchen table today and started feeling "weak ".  When asked to further describe that she just felt like she was about to pass out.  No LOC.  Denies any numbness or tingling.  Just feels lack of energy.  Denies any headache.  No pain.  No dysuria.  States that she did drink some Pedialyte yesterday and felt like that did improve her symptoms.     Physical Exam   Triage Vital Signs: ED Triage Vitals  Enc Vitals Group     BP 11/16/22 1113 131/66     Pulse Rate 11/16/22 1113 66     Resp 11/16/22 1113 18     Temp 11/16/22 1113 98 F (36.7 C)     Temp Source 11/16/22 1113 Oral     SpO2 11/16/22 1113 95 %     Weight 11/16/22 1114 203 lb (92.1 kg)     Height 11/16/22 1114 5\' 6"  (1.676 m)     Head Circumference --      Peak Flow --      Pain Score 11/16/22 1114 0     Pain Loc --      Pain Edu? --      Excl. in GC? --     Most recent vital signs: Vitals:   11/16/22 1113  BP: 131/66  Pulse: 66  Resp: 18  Temp: 98 F (36.7 C)  SpO2: 95%     Constitutional: Alert  Eyes: Conjunctivae are normal.  Head: Atraumatic. Nose: No congestion/rhinnorhea. Mouth/Throat: Mucous membranes are moist.   Neck: Painless ROM.  Cardiovascular:   Good peripheral circulation. Respiratory: Normal respiratory effort.  No retractions.  Gastrointestinal: Soft and nontender.  Musculoskeletal:  no deformity Neurologic:  MAE spontaneously. No gross focal neurologic deficits are appreciated.   Skin:  Skin is warm, dry and intact. No rash noted. Psychiatric: Mood and affect are normal. Speech and behavior are normal.    ED Results / Procedures / Treatments   Labs (all labs ordered are listed, but only abnormal results are displayed) Labs Reviewed  BASIC METABOLIC PANEL - Abnormal; Notable for the following components:      Result Value   Glucose, Bld 192 (*)    BUN 27 (*)    Creatinine, Ser 1.09 (*)    GFR, Estimated 50 (*)    All other components within normal limits  URINALYSIS, ROUTINE W REFLEX MICROSCOPIC - Abnormal; Notable for the following components:   Color, Urine YELLOW (*)    APPearance CLEAR (*)    Leukocytes,Ua TRACE (*)    All other components within normal limits  CBG MONITORING, ED - Abnormal; Notable for the following components:   Glucose-Capillary 178 (*)    All other components within normal limits  CBC  TROPONIN I (HIGH SENSITIVITY)  TROPONIN I (HIGH SENSITIVITY)     EKG  ED ECG REPORT I, Willy Eddy, the attending physician, personally viewed and interpreted this ECG.  Date: 11/16/2022  EKG Time: 11:20  Rate: 60  Rhythm: sinus  Axis: normal  Intervals:lbbb  ST&T Change: no stemi, no depressions    RADIOLOGY Please see ED Course for my review and interpretation.  I personally reviewed all radiographic images ordered to evaluate for the above acute complaints and reviewed radiology reports and findings.  These findings were personally discussed with the patient.  Please see medical record for radiology report.    PROCEDURES:  Critical Care performed: No  Procedures   MEDICATIONS ORDERED IN ED: Medications  sodium chloride 0.9 % bolus 500 mL (500 mLs Intravenous New Bag/Given 11/16/22 1359)     IMPRESSION / MDM / ASSESSMENT AND PLAN / ED COURSE  I reviewed the triage vital signs and the nursing notes.                              Differential diagnosis includes, but is not limited to, dehydration, electrolyte  abnormality, anemia, ACS, SAH, IPH, mass, CHF, UTI  Patient presenting to the ER for evaluation of symptoms as described above.  Based on symptoms, risk factors and considered above differential, this presenting complaint could reflect a potentially life-threatening illness therefore the patient will be placed on continuous pulse oximetry and telemetry for monitoring.  Laboratory evaluation will be sent to evaluate for the above complaints.      Clinical Course as of 11/16/22 1530  Fri Nov 16, 2022  1247 Chest x-ray my review and interpretation without evidence of infiltrate or consolidation. [PR]  1529 Patient's workup is reassuring.  No sign of UTI.  Troponins negative.  Mild hyperglycemia.  She feels improved after IV hydration.  CT head without evidence of acute findings.  Does appear stable and appropriate for outpatient follow-up. [PR]    Clinical Course User Index [PR] Willy Eddy, MD     FINAL CLINICAL IMPRESSION(S) / ED DIAGNOSES   Final diagnoses:  Malaise     Rx / DC Orders   ED Discharge Orders     None        Note:  This document was prepared using Dragon voice recognition software and may include unintentional dictation errors.    Willy Eddy, MD 11/16/22 1530

## 2022-11-21 DIAGNOSIS — H26493 Other secondary cataract, bilateral: Secondary | ICD-10-CM | POA: Diagnosis not present

## 2022-11-21 DIAGNOSIS — Z961 Presence of intraocular lens: Secondary | ICD-10-CM | POA: Diagnosis not present

## 2022-11-21 DIAGNOSIS — E119 Type 2 diabetes mellitus without complications: Secondary | ICD-10-CM | POA: Diagnosis not present

## 2022-11-21 LAB — HM DIABETES EYE EXAM

## 2022-11-27 ENCOUNTER — Ambulatory Visit: Payer: 59 | Admitting: Student in an Organized Health Care Education/Training Program

## 2022-11-28 ENCOUNTER — Other Ambulatory Visit: Payer: Self-pay | Admitting: Physician Assistant

## 2022-11-28 DIAGNOSIS — E118 Type 2 diabetes mellitus with unspecified complications: Secondary | ICD-10-CM

## 2022-12-06 NOTE — Progress Notes (Signed)
Patient presents with her daughter, Elease Hashimoto; Elease Hashimoto is in agreement with patient's noted complaints and concerns. She has nothing further to add.  Established patient visit  Patient: Vanessa Vazquez   DOB: 10/21/1934   88 y.o. Female  MRN: [id number] Visit Date: 12/07/2022  Today's healthcare provider: Jacky Kindle, FNP  Introduced to nurse practitioner role and practice setting.  All questions answered.  Discussed provider/patient relationship and expectations.  Chief Complaint  Patient presents with   Fatigue   Subjective    HPI HPI   Patient c/o worsening weakness x a couple of months. She reports feeling tired and no strength to do anything.  Last edited by Myles Lipps, CMA on 12/07/2022 10:44 AM.      Medications: Outpatient Medications Prior to Visit  Medication Sig   albuterol (VENTOLIN HFA) 108 (90 Base) MCG/ACT inhaler Inhale 2 puffs into the lungs every 4 (four) hours as needed for wheezing or shortness of breath.   atenolol (TENORMIN) 25 MG tablet TAKE 1 TABLET BY MOUTH ONCE DAILY   ferrous sulfate 325 (65 FE) MG tablet Take 325 mg by mouth once a week.   gabapentin (NEURONTIN) 100 MG capsule TAKE 1 CAPSULE BY MOUTH ONCE DAILY   glimepiride (AMARYL) 2 MG tablet TAKE 1 TABLET BY MOUTH TWICE DAILY   JANUVIA 100 MG tablet TAKE 1 TABLET BY MOUTH ONCE DAILY   loratadine (CLARITIN) 10 MG tablet Take 1 tablet (10 mg total) by mouth daily.   metFORMIN (GLUCOPHAGE) 1000 MG tablet TAKE 1 TABLET BY MOUTH TWICE DAILY   Misc. Devices (WALKER) MISC 1 each by Does not apply route daily.   omeprazole (PRILOSEC) 20 MG capsule TAKE 1 CAPSULE BY MOUTH ONCE DAILY   Respiratory Therapy Supplies (NEBULIZER) DEVI 1 Device by Does not apply route 2 (two) times daily as needed.   rosuvastatin (CRESTOR) 5 MG tablet Take 1 tablet (5 mg total) by mouth daily.   [DISCONTINUED] losartan-hydrochlorothiazide (HYZAAR) 50-12.5 MG tablet TAKE 2 TABLETS BY MOUTH ONCE DAILY    [DISCONTINUED] ferrous sulfate 300 (60 Fe) MG/5ML syrup Take 5 mLs (300 mg total) by mouth daily. (Patient not taking: Reported on 10/23/2022)   No facility-administered medications prior to visit.    Review of Systems  Last CBC Lab Results  Component Value Date   WBC 4.5 11/16/2022   HGB 12.3 11/16/2022   HCT 39.2 11/16/2022   MCV 97.0 11/16/2022   MCH 30.4 11/16/2022   RDW 12.6 11/16/2022   PLT 212 11/16/2022   Last metabolic panel Lab Results  Component Value Date   GLUCOSE 192 (H) 11/16/2022   NA 135 11/16/2022   K 3.7 11/16/2022   CL 103 11/16/2022   CO2 24 11/16/2022   BUN 27 (H) 11/16/2022   CREATININE 1.09 (H) 11/16/2022   GFRNONAA 50 (L) 11/16/2022   CALCIUM 9.5 11/16/2022   PROT 7.2 10/23/2022   ALBUMIN 4.1 10/23/2022   LABGLOB 3.1 10/23/2022   AGRATIO 1.3 10/23/2022   BILITOT 0.4 10/23/2022   ALKPHOS 101 10/23/2022   AST 22 10/23/2022   ALT 20 10/23/2022   ANIONGAP 8 11/16/2022   Last lipids Lab Results  Component Value Date   CHOL 297 (H) 10/23/2022   HDL 59 10/23/2022   LDLCALC 195 (H) 10/23/2022   TRIG 224 (H) 10/23/2022   CHOLHDL 5.0 (H) 10/23/2022   Last hemoglobin A1c Lab Results  Component Value Date   HGBA1C 6.0 (H) 10/23/2022   Last thyroid functions  Lab Results  Component Value Date   TSH 0.727 10/23/2022     Objective    BP (!) 124/55 (BP Location: Right Arm, Patient Position: Sitting, Cuff Size: Large)   Pulse 77   Temp 98 F (36.7 C) (Temporal)   Resp 16   Ht 5\' 6"  (1.676 m)   Wt 226 lb (102.5 kg)   SpO2 97%   BMI 36.48 kg/m   BP Readings from Last 3 Encounters:  12/07/22 (!) 124/55  11/16/22 131/66  11/06/22 (!) 123/52   Wt Readings from Last 3 Encounters:  12/07/22 226 lb (102.5 kg)  11/16/22 203 lb (92.1 kg)  11/06/22 233 lb (105.7 kg)   Physical Exam Vitals and nursing note reviewed.  Constitutional:      General: She is not in acute distress.    Appearance: Normal appearance. She is obese. She is not  ill-appearing, toxic-appearing or diaphoretic.  HENT:     Head: Normocephalic and atraumatic.  Cardiovascular:     Rate and Rhythm: Normal rate and regular rhythm.     Pulses: Normal pulses.     Heart sounds: Normal heart sounds. No murmur heard.    No friction rub. No gallop.     Comments: Non pitting on exam; however, indentation at top of TED hose  Pulmonary:     Effort: Pulmonary effort is normal. No respiratory distress.     Breath sounds: Normal breath sounds. No stridor. No wheezing, rhonchi or rales.  Chest:     Chest wall: No tenderness.  Abdominal:     General: Bowel sounds are normal.     Palpations: Abdomen is soft.     Comments: Denies concern for GI symptoms including change in frequency, color, consistency of Bms; denies NVD or sick contacts, foreign travel  Musculoskeletal:        General: Swelling present. No tenderness, deformity or signs of injury. Normal range of motion.     Right lower leg: 3+ Edema present.     Left lower leg: 3+ Edema present.  Skin:    General: Skin is warm and dry.     Capillary Refill: Capillary refill takes less than 2 seconds.     Coloration: Skin is not jaundiced or pale.     Findings: No bruising, erythema, lesion or rash.  Neurological:     General: No focal deficit present.     Mental Status: She is alert and oriented to person, place, and time. Mental status is at baseline.     Cranial Nerves: No cranial nerve deficit.     Sensory: No sensory deficit.     Motor: Weakness present.     Coordination: Coordination normal.     Comments: At baseline; use of walker [not new]  Psychiatric:        Mood and Affect: Mood normal.        Behavior: Behavior normal.        Thought Content: Thought content normal.        Judgment: Judgment normal.     Comments: Denies concern for anxiety/depression; reports wakening early morning with increased thought processes. Denies physical complaints including changes in HR, BP, BG nervousness/tremors  etc; reports symptoms are 'in her head'    No results found for any visits on 12/07/22.  Assessment & Plan     Problem List Items Addressed This Visit       Cardiovascular and Mediastinum   Hypertension associated with diabetes (HCC)    Chronic; at goal  Refer to cards for secondary consult given ongoing complaints of malaise, known HTN and LE edema  -atenolol 25 mg daily; hyzaar 100-25 daily      Relevant Medications   losartan-hydrochlorothiazide (HYZAAR) 50-12.5 MG tablet   Other Relevant Orders   Ambulatory referral to Cardiology     Other   Chronic fatigue and malaise - Primary    Chronic; worsening Reports ED visit 3 weeks ago for similar complaints; exam and labs not revealing Patient denies any focal change from that time      Relevant Orders   Ambulatory referral to Cardiology   Frailty    Chronic; pt reports "worsening in past months" reports that symptoms come early morning and are "in her head" Reassurance provided that labs 3 weeks ago were stable Have reviewed medications as well as vitals stable Pt is awake, alert, able to maintain posture sitting in chair; reports mobility at baseline with use of walker Unclear cause vs generalized aging debility  Encouraged to keep f/u with PCP      Relevant Orders   Ambulatory referral to Cardiology   Morbid obesity (HCC)    Chronic; variable weight Pt does not weigh at home; unclear what her 'dry weight' may be Recommend referral to cardiology given ongoing concerns of malaise/fatigue Reports mobility is at baseline Body mass index is 36.48 kg/m. BMI 36 associated with HTN and DM      Relevant Orders   Ambulatory referral to Cardiology   Return if symptoms worsen or fail to improve.     Leilani Merl, FNP, have reviewed all documentation for this visit. The documentation on 12/07/22 for the exam, diagnosis, procedures, and orders are all accurate and complete.  Jacky Kindle, FNP  [facility]  Family Practice [phone] (phone) [phone] (fax)  [facility]

## 2022-12-07 ENCOUNTER — Ambulatory Visit (INDEPENDENT_AMBULATORY_CARE_PROVIDER_SITE_OTHER): Payer: 59 | Admitting: Family Medicine

## 2022-12-07 ENCOUNTER — Encounter: Payer: Self-pay | Admitting: Cardiology

## 2022-12-07 ENCOUNTER — Encounter: Payer: Self-pay | Admitting: Family Medicine

## 2022-12-07 ENCOUNTER — Ambulatory Visit: Payer: 59 | Attending: Cardiology | Admitting: Cardiology

## 2022-12-07 VITALS — BP 124/55 | HR 77 | Temp 98.0°F | Resp 16 | Ht 66.0 in | Wt 226.0 lb

## 2022-12-07 VITALS — BP 120/58 | HR 70 | Ht 62.0 in | Wt 228.0 lb

## 2022-12-07 DIAGNOSIS — I152 Hypertension secondary to endocrine disorders: Secondary | ICD-10-CM | POA: Diagnosis not present

## 2022-12-07 DIAGNOSIS — R5383 Other fatigue: Secondary | ICD-10-CM

## 2022-12-07 DIAGNOSIS — E1159 Type 2 diabetes mellitus with other circulatory complications: Secondary | ICD-10-CM

## 2022-12-07 DIAGNOSIS — R0600 Dyspnea, unspecified: Secondary | ICD-10-CM

## 2022-12-07 DIAGNOSIS — R600 Localized edema: Secondary | ICD-10-CM

## 2022-12-07 DIAGNOSIS — I10 Essential (primary) hypertension: Secondary | ICD-10-CM | POA: Diagnosis not present

## 2022-12-07 DIAGNOSIS — R5381 Other malaise: Secondary | ICD-10-CM | POA: Diagnosis not present

## 2022-12-07 DIAGNOSIS — R54 Age-related physical debility: Secondary | ICD-10-CM | POA: Insufficient documentation

## 2022-12-07 MED ORDER — LOSARTAN POTASSIUM-HCTZ 50-12.5 MG PO TABS: 2.0000 | ORAL_TABLET | Freq: Every day | ORAL | 0 refills | Status: DC

## 2022-12-07 MED ORDER — FUROSEMIDE 20 MG PO TABS: 20.0000 mg | ORAL_TABLET | Freq: Every day | ORAL | 3 refills | Status: DC | PRN

## 2022-12-07 NOTE — Assessment & Plan Note (Signed)
Chronic; pt reports "worsening in past months" reports that symptoms come early morning and are "in her head" Reassurance provided that labs 3 weeks ago were stable Have reviewed medications as well as vitals stable Pt is awake, alert, able to maintain posture sitting in chair; reports mobility at baseline with use of walker Unclear cause vs generalized aging debility  Encouraged to keep f/u with PCP

## 2022-12-07 NOTE — Progress Notes (Signed)
Cardiology Office Note:    Date:  12/07/2022   ID:  Vanessa Vazquez, DOB 1935/01/19, MRN [id number]  PCP:  Debera Lat, PA-C   [facility] Providers Cardiologist:  Debbe Odea, MD     Referring MD: Jacky Kindle, FNP   Chief Complaint  Patient presents with   New Patient (Initial Visit)    Referred for Hypertension associated with diabetes evaluation.  Patient reports new intermittent chest pain for past week.  No cardiac history.    History of Present Illness:    Vanessa Vazquez is a 88 y.o. female with a hx of hypertension, hyperlipidemia, diabetes, GERD who presents due to fatigue and shortness of breath.  Symptoms of fatigue have been ongoing over the past 1 to 2 months.  Symptoms usually get worse with exertion.  Also endorses poor sleep habits, sleeps for about 3 to 4 hours nightly.  Has had nonspecific chest pain occurring over the past 2 weeks lasting a few seconds.  Endorses bilateral leg edema.  Recently started on Crestor for hyperlipidemia.  Past Medical History:  Diagnosis Date   Asthma    Diabetes mellitus without complication (HCC)    GERD (gastroesophageal reflux disease)    Hypercholesteremia    Hypertension     Past Surgical History:  Procedure Laterality Date   CESAREAN SECTION     COLONOSCOPY WITH PROPOFOL N/A 03/19/2016   Procedure: COLONOSCOPY WITH PROPOFOL;  Surgeon: Scot Jun, MD;  Location: [facility] ENDOSCOPY;  Service: Endoscopy;  Laterality: N/A;    Current Medications: Current Meds  Medication Sig   albuterol (VENTOLIN HFA) 108 (90 Base) MCG/ACT inhaler Inhale 2 puffs into the lungs every 4 (four) hours as needed for wheezing or shortness of breath.   ferrous sulfate 325 (65 FE) MG tablet Take 325 mg by mouth once a week.   furosemide (LASIX) 20 MG tablet Take 1 tablet (20 mg total) by mouth daily as needed.   gabapentin (NEURONTIN) 100 MG capsule TAKE 1 CAPSULE BY MOUTH ONCE DAILY   glimepiride (AMARYL) 2 MG tablet TAKE 1  TABLET BY MOUTH TWICE DAILY   JANUVIA 100 MG tablet TAKE 1 TABLET BY MOUTH ONCE DAILY   loratadine (CLARITIN) 10 MG tablet Take 1 tablet (10 mg total) by mouth daily.   losartan-hydrochlorothiazide (HYZAAR) 50-12.5 MG tablet Take 2 tablets by mouth daily.   metFORMIN (GLUCOPHAGE) 1000 MG tablet TAKE 1 TABLET BY MOUTH TWICE DAILY   Misc. Devices (WALKER) MISC 1 each by Does not apply route daily.   omeprazole (PRILOSEC) 20 MG capsule TAKE 1 CAPSULE BY MOUTH ONCE DAILY   Respiratory Therapy Supplies (NEBULIZER) DEVI 1 Device by Does not apply route 2 (two) times daily as needed.   rosuvastatin (CRESTOR) 5 MG tablet Take 1 tablet (5 mg total) by mouth daily.   [DISCONTINUED] atenolol (TENORMIN) 25 MG tablet TAKE 1 TABLET BY MOUTH ONCE DAILY     Allergies:   Patient has no known allergies.   Social History   Socioeconomic History   Marital status: Widowed    Spouse name: Not on file   Number of children: Not on file   Years of education: Not on file   Highest education level: GED or equivalent  Occupational History   Not on file  Tobacco Use   Smoking status: Former   Smokeless tobacco: Never  Vaping Use   Vaping status: Never Used  Substance and Sexual Activity   Alcohol use: No    Alcohol/week:  0.0 standard drinks of alcohol   Drug use: No   Sexual activity: Never  Other Topics Concern   Not on file  Social History Narrative   Not on file   Social Determinants of Health   Financial Resource Strain: Low Risk  (05/14/2022)   Overall Financial Resource Strain (CARDIA)    Difficulty of Paying Living Expenses: Not hard at all  Food Insecurity: No Food Insecurity (05/14/2022)   Hunger Vital Sign    Worried About Running Out of Food in the Last Year: Never true    Ran Out of Food in the Last Year: Never true  Transportation Needs: No Transportation Needs (05/14/2022)   PRAPARE - Administrator, Civil Service (Medical): No    Lack of Transportation (Non-Medical):  No  Physical Activity: Inactive (05/14/2022)   Exercise Vital Sign    Days of Exercise per Week: 0 days    Minutes of Exercise per Session: 0 min  Stress: No Stress Concern Present (05/14/2022)   [employer] of Occupational Health - Occupational Stress Questionnaire    Feeling of Stress : Not at all  Social Connections: Socially Isolated (05/14/2022)   Social Connection and Isolation Panel [NHANES]    Frequency of Communication with Friends and Family: Twice a week    Frequency of Social Gatherings with Friends and Family: Never    Attends Religious Services: More than 4 times per year    Active Member of [employer] or Organizations: No    Attends Banker Meetings: Never    Marital Status: Widowed     Family History: The patient's family history includes Asthma in her child; Diabetes in her sister; Heart attack in her mother; Heart disease in her mother; Leukemia in her sister; Lung cancer in her father.  ROS:   Please see the history of present illness.     All other systems reviewed and are negative.  EKGs/Labs/Other Studies Reviewed:    The following studies were reviewed today:  EKG Interpretation Date/Time:  Friday December 07 2022 14:16:44 EDT Ventricular Rate:  70 PR Interval:  138 QRS Duration:  128 QT Interval:  404 QTC Calculation: 436 R Axis:   -26  Text Interpretation: Sinus rhythm  with marked sinus arrhythmia Left bundle branch block Confirmed by Debbe Odea ([id number]) on 12/07/2022 2:28:44 PM    Recent Labs: 01/01/2022: B Natriuretic Peptide 35.6 10/23/2022: ALT 20; TSH 0.727 11/16/2022: BUN 27; Creatinine, Ser 1.09; Hemoglobin 12.3; Platelets 212; Potassium 3.7; Sodium 135  Recent Lipid Panel    Component Value Date/Time   CHOL 297 (H) 10/23/2022 1626   TRIG 224 (H) 10/23/2022 1626   HDL 59 10/23/2022 1626   CHOLHDL 5.0 (H) 10/23/2022 1626   CHOLHDL 3.5 01/16/2021 1530   LDLCALC 195 (H) 10/23/2022 1626   LDLCALC 103 (H) 01/16/2021 1530      Risk Assessment/Calculations:             Physical Exam:    VS:  BP (!) 120/58 (BP Location: Left Arm, Patient Position: Sitting, Cuff Size: Large)   Pulse 70   Ht 5\' 2"  (1.575 m)   Wt 228 lb (103.4 kg)   SpO2 93%   BMI 41.70 kg/m     Wt Readings from Last 3 Encounters:  12/07/22 228 lb (103.4 kg)  12/07/22 226 lb (102.5 kg)  11/16/22 203 lb (92.1 kg)     GEN:  Well nourished, well developed in no acute distress HEENT: Normal NECK: No JVD;  No carotid bruits CARDIAC: RRR, no murmurs, rubs, gallops RESPIRATORY:  Clear to auscultation without rales, wheezing or rhonchi  ABDOMEN: Soft, non-tender, non-distended MUSCULOSKELETAL:  1-2+ edema; No deformity  SKIN: Warm and dry NEUROLOGIC:  Alert and oriented x 3 PSYCHIATRIC:  Normal affect   ASSESSMENT:    1. Dyspnea, unspecified type   2. Bilateral leg edema   3. Primary hypertension   4. Fatigue, unspecified type    PLAN:    In order of problems listed above:  Dyspnea, deconditioning, possible OSA, morbid obesity contributing.  Also has nonspecific chest pain.  Get echocardiogram to rule out any structural abnormalities.  May consider stress testing at follow-up visit if chest pain persist. Bilateral leg edema, start Lasix 20 mg daily as needed. Hypertension, BP low normal.  Stop atenolol due to symptoms of fatigue, lack of energy.  Continue losartan, HCTZ. Generalized fatigue, somnolence, poor sleep habits, morbidly obese.  Refer to sleep medicine for OSA eval.  Follow-up after echo      Medication Adjustments/Labs and Tests Ordered: Current medicines are reviewed at length with the patient today.  Concerns regarding medicines are outlined above.  Orders Placed This Encounter  Procedures   Ambulatory referral to Pulmonology   EKG 12-Lead   ECHOCARDIOGRAM COMPLETE   Meds ordered this encounter  Medications   furosemide (LASIX) 20 MG tablet    Sig: Take 1 tablet (20 mg total) by mouth daily as needed.     Dispense:  90 tablet    Refill:  3    Patient Instructions  Medication Instructions:   STOP atenolol (TENORMIN) 25 MG tablet START Lasix - Take one tablet ( 20mg ) by mouth daily as needed for edema  *If you need a refill on your cardiac medications before your next appointment, please call your pharmacy*   Lab Work:  None Ordered  If you have labs (blood work) drawn today and your tests are completely normal, you will receive your results only by: MyChart Message (if you have MyChart) OR A paper copy in the mail If you have any lab test that is abnormal or we need to change your treatment, we will call you to review the results.   Testing/Procedures:  Your physician has requested that you have an echocardiogram. Echocardiography is a painless test that uses sound waves to create images of your heart. It provides your doctor with information about the size and shape of your heart and how well your heart's chambers and valves are working. This procedure takes approximately one hour. There are no restrictions for this procedure. Please do NOT wear cologne, perfume, aftershave, or lotions (deodorant is allowed). Please arrive 15 minutes prior to your appointment time.    Follow-Up: At [facility], you and your health needs are our priority.  As part of our continuing mission to provide you with exceptional heart care, we have created designated Provider Care Teams.  These Care Teams include your primary Cardiologist (physician) and Advanced Practice Providers (APPs -  Physician Assistants and Nurse Practitioners) who all work together to provide you with the care you need, when you need it.  We recommend signing up for the patient portal called "MyChart".  Sign up information is provided on this After Visit Summary.  MyChart is used to connect with patients for Virtual Visits (Telemedicine).  Patients are able to view lab/test results, encounter notes, upcoming  appointments, etc.  Non-urgent messages can be sent to your provider as well.   To learn  more about what you can do with MyChart, go to [email or web address].    Your next appointment:   6 week(s)  Provider:   Debbe Odea, MD ONLY     Signed, Debbe Odea, MD  12/07/2022 3:30 PM    [facility]

## 2022-12-07 NOTE — Assessment & Plan Note (Signed)
Chronic; worsening Reports ED visit 3 weeks ago for similar complaints; exam and labs not revealing Patient denies any focal change from that time

## 2022-12-07 NOTE — Assessment & Plan Note (Signed)
Chronic; variable weight Pt does not weigh at home; unclear what her 'dry weight' may be Recommend referral to cardiology given ongoing concerns of malaise/fatigue Reports mobility is at baseline Body mass index is 36.48 kg/m. BMI 36 associated with HTN and DM

## 2022-12-07 NOTE — Patient Instructions (Signed)
Referral placed to cardiology -keep all scheduled appts Labs deferred given no acute changes Continue to monitor lower edema

## 2022-12-07 NOTE — Assessment & Plan Note (Signed)
Chronic; at goal Refer to cards for secondary consult given ongoing complaints of malaise, known HTN and LE edema  -atenolol 25 mg daily; hyzaar 100-25 daily

## 2022-12-07 NOTE — Patient Instructions (Signed)
Medication Instructions:   STOP atenolol (TENORMIN) 25 MG tablet START Lasix - Take one tablet ( 20mg ) by mouth daily as needed for edema  *If you need a refill on your cardiac medications before your next appointment, please call your pharmacy*   Lab Work:  None Ordered  If you have labs (blood work) drawn today and your tests are completely normal, you will receive your results only by: MyChart Message (if you have MyChart) OR A paper copy in the mail If you have any lab test that is abnormal or we need to change your treatment, we will call you to review the results.   Testing/Procedures:  Your physician has requested that you have an echocardiogram. Echocardiography is a painless test that uses sound waves to create images of your heart. It provides your doctor with information about the size and shape of your heart and how well your heart's chambers and valves are working. This procedure takes approximately one hour. There are no restrictions for this procedure. Please do NOT wear cologne, perfume, aftershave, or lotions (deodorant is allowed). Please arrive 15 minutes prior to your appointment time.    Follow-Up: At [facility], you and your health needs are our priority.  As part of our continuing mission to provide you with exceptional heart care, we have created designated Provider Care Teams.  These Care Teams include your primary Cardiologist (physician) and Advanced Practice Providers (APPs -  Physician Assistants and Nurse Practitioners) who all work together to provide you with the care you need, when you need it.  We recommend signing up for the patient portal called "MyChart".  Sign up information is provided on this After Visit Summary.  MyChart is used to connect with patients for Virtual Visits (Telemedicine).  Patients are able to view lab/test results, encounter notes, upcoming appointments, etc.  Non-urgent messages can be sent to your provider as well.    To learn more about what you can do with MyChart, go to [email or web address].    Your next appointment:   6 week(s)  Provider:   Debbe Odea, MD ONLY

## 2022-12-07 NOTE — Progress Notes (Deleted)
Patient presents with her daughter, Elease Hashimoto; Elease Hashimoto is in agreement with patient's noted complaints and concerns. She has nothing further to add.  Established patient visit  Patient: Vanessa Vazquez   DOB: 10/21/1934   88 y.o. Female  MRN: [id number] Visit Date: 12/07/2022  Today's healthcare provider: Jacky Kindle, FNP  Introduced to nurse practitioner role and practice setting.  All questions answered.  Discussed provider/patient relationship and expectations.  Chief Complaint  Patient presents with   Fatigue   Subjective    HPI HPI   Patient c/o worsening weakness x a couple of months. She reports feeling tired and no strength to do anything.  Last edited by Myles Lipps, CMA on 12/07/2022 10:44 AM.      Medications: Outpatient Medications Prior to Visit  Medication Sig   albuterol (VENTOLIN HFA) 108 (90 Base) MCG/ACT inhaler Inhale 2 puffs into the lungs every 4 (four) hours as needed for wheezing or shortness of breath.   atenolol (TENORMIN) 25 MG tablet TAKE 1 TABLET BY MOUTH ONCE DAILY   ferrous sulfate 325 (65 FE) MG tablet Take 325 mg by mouth once a week.   gabapentin (NEURONTIN) 100 MG capsule TAKE 1 CAPSULE BY MOUTH ONCE DAILY   glimepiride (AMARYL) 2 MG tablet TAKE 1 TABLET BY MOUTH TWICE DAILY   JANUVIA 100 MG tablet TAKE 1 TABLET BY MOUTH ONCE DAILY   loratadine (CLARITIN) 10 MG tablet Take 1 tablet (10 mg total) by mouth daily.   metFORMIN (GLUCOPHAGE) 1000 MG tablet TAKE 1 TABLET BY MOUTH TWICE DAILY   Misc. Devices (WALKER) MISC 1 each by Does not apply route daily.   omeprazole (PRILOSEC) 20 MG capsule TAKE 1 CAPSULE BY MOUTH ONCE DAILY   Respiratory Therapy Supplies (NEBULIZER) DEVI 1 Device by Does not apply route 2 (two) times daily as needed.   rosuvastatin (CRESTOR) 5 MG tablet Take 1 tablet (5 mg total) by mouth daily.   [DISCONTINUED] losartan-hydrochlorothiazide (HYZAAR) 50-12.5 MG tablet TAKE 2 TABLETS BY MOUTH ONCE DAILY    [DISCONTINUED] ferrous sulfate 300 (60 Fe) MG/5ML syrup Take 5 mLs (300 mg total) by mouth daily. (Patient not taking: Reported on 10/23/2022)   No facility-administered medications prior to visit.    Review of Systems  Last CBC Lab Results  Component Value Date   WBC 4.5 11/16/2022   HGB 12.3 11/16/2022   HCT 39.2 11/16/2022   MCV 97.0 11/16/2022   MCH 30.4 11/16/2022   RDW 12.6 11/16/2022   PLT 212 11/16/2022   Last metabolic panel Lab Results  Component Value Date   GLUCOSE 192 (H) 11/16/2022   NA 135 11/16/2022   K 3.7 11/16/2022   CL 103 11/16/2022   CO2 24 11/16/2022   BUN 27 (H) 11/16/2022   CREATININE 1.09 (H) 11/16/2022   GFRNONAA 50 (L) 11/16/2022   CALCIUM 9.5 11/16/2022   PROT 7.2 10/23/2022   ALBUMIN 4.1 10/23/2022   LABGLOB 3.1 10/23/2022   AGRATIO 1.3 10/23/2022   BILITOT 0.4 10/23/2022   ALKPHOS 101 10/23/2022   AST 22 10/23/2022   ALT 20 10/23/2022   ANIONGAP 8 11/16/2022   Last lipids Lab Results  Component Value Date   CHOL 297 (H) 10/23/2022   HDL 59 10/23/2022   LDLCALC 195 (H) 10/23/2022   TRIG 224 (H) 10/23/2022   CHOLHDL 5.0 (H) 10/23/2022   Last hemoglobin A1c Lab Results  Component Value Date   HGBA1C 6.0 (H) 10/23/2022   Last thyroid functions  Lab Results  Component Value Date   TSH 0.727 10/23/2022     Objective    BP (!) 124/55 (BP Location: Right Arm, Patient Position: Sitting, Cuff Size: Large)   Pulse 77   Temp 98 F (36.7 C) (Temporal)   Resp 16   Ht 5\' 6"  (1.676 m)   Wt 226 lb (102.5 kg)   SpO2 97%   BMI 36.48 kg/m   BP Readings from Last 3 Encounters:  12/07/22 (!) 124/55  11/16/22 131/66  11/06/22 (!) 123/52   Wt Readings from Last 3 Encounters:  12/07/22 226 lb (102.5 kg)  11/16/22 203 lb (92.1 kg)  11/06/22 233 lb (105.7 kg)   Physical Exam Vitals and nursing note reviewed.  Constitutional:      General: She is not in acute distress.    Appearance: Normal appearance. She is obese. She is not  ill-appearing, toxic-appearing or diaphoretic.  HENT:     Head: Normocephalic and atraumatic.  Cardiovascular:     Rate and Rhythm: Normal rate and regular rhythm.     Pulses: Normal pulses.     Heart sounds: Normal heart sounds. No murmur heard.    No friction rub. No gallop.     Comments: Non pitting on exam; however, indentation at top of TED hose  Pulmonary:     Effort: Pulmonary effort is normal. No respiratory distress.     Breath sounds: Normal breath sounds. No stridor. No wheezing, rhonchi or rales.  Chest:     Chest wall: No tenderness.  Abdominal:     General: Bowel sounds are normal.     Palpations: Abdomen is soft.     Comments: Denies concern for GI symptoms including change in frequency, color, consistency of Bms; denies NVD or sick contacts, foreign travel  Musculoskeletal:        General: Swelling present. No tenderness, deformity or signs of injury. Normal range of motion.     Right lower leg: 3+ Edema present.     Left lower leg: 3+ Edema present.  Skin:    General: Skin is warm and dry.     Capillary Refill: Capillary refill takes less than 2 seconds.     Coloration: Skin is not jaundiced or pale.     Findings: No bruising, erythema, lesion or rash.  Neurological:     General: No focal deficit present.     Mental Status: She is alert and oriented to person, place, and time. Mental status is at baseline.     Cranial Nerves: No cranial nerve deficit.     Sensory: No sensory deficit.     Motor: Weakness present.     Coordination: Coordination normal.     Comments: At baseline; use of walker [not new]  Psychiatric:        Mood and Affect: Mood normal.        Behavior: Behavior normal.        Thought Content: Thought content normal.        Judgment: Judgment normal.     Comments: Denies concern for anxiety/depression; reports wakening early morning with increased thought processes. Denies physical complaints including changes in HR, BP, BG nervousness/tremors  etc; reports symptoms are 'in her head'    No results found for any visits on 12/07/22.  Assessment & Plan     Problem List Items Addressed This Visit       Cardiovascular and Mediastinum   Hypertension associated with diabetes (HCC)    Chronic; at goal  Refer to cards for secondary consult given ongoing complaints of malaise, known HTN and LE edema  -atenolol 25 mg daily; hyzaar 100-25 daily      Relevant Medications   losartan-hydrochlorothiazide (HYZAAR) 50-12.5 MG tablet   Other Relevant Orders   Ambulatory referral to Cardiology     Other   Chronic fatigue and malaise - Primary    Chronic; worsening Reports ED visit 3 weeks ago for similar complaints; exam and labs not revealing Patient denies any focal change from that time      Relevant Orders   Ambulatory referral to Cardiology   Frailty    Chronic; pt reports "worsening in past months" reports that symptoms come early morning and are "in her head" Reassurance provided that labs 3 weeks ago were stable Have reviewed medications as well as vitals stable Pt is awake, alert, able to maintain posture sitting in chair; reports mobility at baseline with use of walker Unclear cause vs generalized aging debility  Encouraged to keep f/u with PCP      Relevant Orders   Ambulatory referral to Cardiology   Morbid obesity (HCC)    Chronic; variable weight Pt does not weigh at home; unclear what her 'dry weight' may be Recommend referral to cardiology given ongoing concerns of malaise/fatigue Reports mobility is at baseline Body mass index is 36.48 kg/m. BMI 36 associated with HTN and DM      Relevant Orders   Ambulatory referral to Cardiology   Return if symptoms worsen or fail to improve.     Leilani Merl, FNP, have reviewed all documentation for this visit. The documentation on 12/07/22 for the exam, diagnosis, procedures, and orders are all accurate and complete.  Jacky Kindle, FNP  [facility]  Family Practice [phone] (phone) [phone] (fax)  [facility]

## 2022-12-18 ENCOUNTER — Encounter: Payer: Self-pay | Admitting: Physician Assistant

## 2022-12-18 ENCOUNTER — Ambulatory Visit (INDEPENDENT_AMBULATORY_CARE_PROVIDER_SITE_OTHER): Payer: 59 | Admitting: Physician Assistant

## 2022-12-18 VITALS — BP 115/62 | HR 91 | Ht 61.0 in | Wt 224.4 lb

## 2022-12-18 DIAGNOSIS — I152 Hypertension secondary to endocrine disorders: Secondary | ICD-10-CM | POA: Diagnosis not present

## 2022-12-18 DIAGNOSIS — D508 Other iron deficiency anemias: Secondary | ICD-10-CM | POA: Diagnosis not present

## 2022-12-18 DIAGNOSIS — R5382 Chronic fatigue, unspecified: Secondary | ICD-10-CM

## 2022-12-18 DIAGNOSIS — R5381 Other malaise: Secondary | ICD-10-CM | POA: Diagnosis not present

## 2022-12-18 DIAGNOSIS — N1831 Chronic kidney disease, stage 3a: Secondary | ICD-10-CM

## 2022-12-18 DIAGNOSIS — I10 Essential (primary) hypertension: Secondary | ICD-10-CM

## 2022-12-18 DIAGNOSIS — E1159 Type 2 diabetes mellitus with other circulatory complications: Secondary | ICD-10-CM

## 2022-12-18 NOTE — Progress Notes (Unsigned)
Established patient visit  Patient: Vanessa Vazquez   DOB: 03-10-1935   88 y.o. Female  MRN: [id number] Visit Date: 12/18/2022  Today's healthcare provider: Debera Lat, PA-C   Chief Complaint  Patient presents with   Medical Management of Chronic Issues    Patient is present for 6 week htn f/u. Patient reports she does check her bp at home and gets avg reading on 120's/70's. She reports eating a general diet. She reports difficulty breathing and sob on execration, and lower leg edema.   Subjective    HPI HPI     Medical Management of Chronic Issues    Additional comments: Patient is present for 6 week htn f/u. Patient reports she does check her bp at home and gets avg reading on 120's/70's. She reports eating a general diet. She reports difficulty breathing and sob on execration, and lower leg edema.      Last edited by Acey Lav, CMA on 12/18/2022 10:58 AM.      *** Discussed the use of AI scribe software for clinical note transcription with the patient, who gave verbal consent to proceed.  History of Present Illness               12/18/2022   10:59 AM 05/14/2022    9:48 AM 07/31/2021   10:02 AM  Depression screen PHQ 2/9  Decreased Interest 0 0 0  Down, Depressed, Hopeless 0 0 0  PHQ - 2 Score 0 0 0  Altered sleeping  0   Tired, decreased energy  0   Change in appetite  0   Feeling bad or failure about yourself   0   Trouble concentrating  0   Moving slowly or fidgety/restless  0   Suicidal thoughts  0   PHQ-9 Score  0   Difficult doing work/chores  Not difficult at all        No data to display          Medications: Outpatient Medications Prior to Visit  Medication Sig   albuterol (VENTOLIN HFA) 108 (90 Base) MCG/ACT inhaler Inhale 2 puffs into the lungs every 4 (four) hours as needed for wheezing or shortness of breath.   ferrous sulfate 325 (65 FE) MG tablet Take 325 mg by mouth once a week.   furosemide (LASIX) 20 MG tablet Take 1 tablet (20  mg total) by mouth daily as needed.   gabapentin (NEURONTIN) 100 MG capsule TAKE 1 CAPSULE BY MOUTH ONCE DAILY   glimepiride (AMARYL) 2 MG tablet TAKE 1 TABLET BY MOUTH TWICE DAILY   JANUVIA 100 MG tablet TAKE 1 TABLET BY MOUTH ONCE DAILY   loratadine (CLARITIN) 10 MG tablet Take 1 tablet (10 mg total) by mouth daily.   losartan-hydrochlorothiazide (HYZAAR) 50-12.5 MG tablet Take 2 tablets by mouth daily.   metFORMIN (GLUCOPHAGE) 1000 MG tablet TAKE 1 TABLET BY MOUTH TWICE DAILY   Misc. Devices (WALKER) MISC 1 each by Does not apply route daily.   omeprazole (PRILOSEC) 20 MG capsule TAKE 1 CAPSULE BY MOUTH ONCE DAILY   Respiratory Therapy Supplies (NEBULIZER) DEVI 1 Device by Does not apply route 2 (two) times daily as needed.   rosuvastatin (CRESTOR) 5 MG tablet Take 1 tablet (5 mg total) by mouth daily.   No facility-administered medications prior to visit.    Review of Systems  All other systems reviewed and are negative.  Except see HPI   {Insert previous labs (optional):23779}  {See past labs  Heme  Chem  Endocrine  Serology  Results Review (optional):1}   Objective    BP 115/62 (BP Location: Left Arm, Patient Position: Sitting, Cuff Size: Large)   Pulse 91   Ht 5\' 1"  (1.549 m)   Wt 224 lb 6.4 oz (101.8 kg)   SpO2 96%   BMI 42.40 kg/m  {Insert last BP/Wt (optional):23777}  {See vitals history (optional):1}  Physical Exam   No results found for any visits on 12/18/22.  Assessment & Plan    *** Assessment and Plan              No follow-ups on file.      [facility]

## 2022-12-21 ENCOUNTER — Encounter: Payer: Self-pay | Admitting: Podiatry

## 2022-12-21 ENCOUNTER — Ambulatory Visit (INDEPENDENT_AMBULATORY_CARE_PROVIDER_SITE_OTHER): Payer: 59 | Admitting: Podiatry

## 2022-12-21 VITALS — BP 155/72 | HR 91

## 2022-12-21 DIAGNOSIS — I10 Essential (primary) hypertension: Secondary | ICD-10-CM | POA: Diagnosis not present

## 2022-12-21 DIAGNOSIS — B351 Tinea unguium: Secondary | ICD-10-CM

## 2022-12-21 DIAGNOSIS — M79674 Pain in right toe(s): Secondary | ICD-10-CM | POA: Diagnosis not present

## 2022-12-21 DIAGNOSIS — M79675 Pain in left toe(s): Secondary | ICD-10-CM

## 2022-12-21 DIAGNOSIS — E119 Type 2 diabetes mellitus without complications: Secondary | ICD-10-CM

## 2022-12-21 DIAGNOSIS — N1831 Chronic kidney disease, stage 3a: Secondary | ICD-10-CM | POA: Diagnosis not present

## 2022-12-21 NOTE — Progress Notes (Signed)
   Chief Complaint  Patient presents with   Diabetes    "Trim my toenails."    SUBJECTIVE Patient PMHx T2DM presents to office today complaining of elongated, thickened nails that cause pain while ambulating in shoes.  Patient is unable to trim their own nails. Patient is here for further evaluation and treatment.  Past Medical History:  Diagnosis Date   Asthma    Diabetes mellitus without complication (HCC)    GERD (gastroesophageal reflux disease)    Hypercholesteremia    Hypertension     No Known Allergies   OBJECTIVE General Patient is awake, alert, and oriented x 3 and in no acute distress. Derm Skin is dry and supple bilateral. Negative open lesions or macerations. Remaining integument unremarkable. Nails are tender, long, thickened and dystrophic with subungual debris, consistent with onychomycosis, 1-5 bilateral. No signs of infection noted. Vasc  DP and PT pedal pulses palpable bilaterally. Temperature gradient within normal limits.  Chronic edema noted bilateral lower extremities Neuro Epicritic and protective threshold sensation grossly intact bilaterally.  Musculoskeletal Exam No symptomatic pedal deformities noted bilateral. Muscular strength within normal limits.  ASSESSMENT 1.  Pain due to onychomycosis of toenails both 2.  Encounter for diabetic foot exam  PLAN OF CARE 1. Patient evaluated today.  Comprehensive diabetic foot exam performed today 2. Instructed to maintain good pedal hygiene and foot care.  3. Mechanical debridement of nails 1-5 bilaterally performed using a nail nipper. Filed with dremel without incident.  4. Return to clinic in 3 mos.    Felecia Shelling, DPM [facility]  Dr. Felecia Shelling, DPM    [address] [place], [place] [address]                Office [phone]  Fax [phone]

## 2022-12-23 NOTE — Progress Notes (Signed)
Please let patient know that her labs are stable except  -elevated blood blood sugar was 241.  Continue taking Januvia 100 Mg, metformin 2000 mg, Amaryl 4 mg.  Advised to let me know if she will be out of medication refill. She needs to adhere to stricter low carb diet.  Advised to measure his blood sugar at home.  If blood sugar will be more than 200 she needs to come back for reassessment in a week and bring all her medication with her. -Decreased kidney function her GFR was 42.  Advised healthy diet and regular exercise as tolerated, drink enough water but be cautious of leg swelling

## 2022-12-24 ENCOUNTER — Telehealth: Payer: Self-pay

## 2022-12-24 NOTE — Telephone Encounter (Signed)
Called patient to go over lab results and medical instruction per PA Oswalt

## 2022-12-24 NOTE — Telephone Encounter (Signed)
-----   Message from [place] sent at 12/23/2022  9:50 PM EDT ----- Please let patient know that her labs are stable except  -elevated blood blood sugar was 241.  Continue taking Januvia 100 Mg, metformin 2000 mg, Amaryl 4 mg.  Advised to let me know if she will be out of medication refill. She needs to adhere to stricter low carb diet.  Advised to measure his blood sugar at home.  If blood sugar will be more than 200 she needs to come back for reassessment in a week and bring all her medication with her. -Decreased kidney function her GFR was 42.  Advised healthy diet and regular exercise as tolerated, drink enough water but be cautious of leg swelling

## 2022-12-28 ENCOUNTER — Other Ambulatory Visit: Payer: Self-pay | Admitting: Physician Assistant

## 2022-12-28 DIAGNOSIS — E118 Type 2 diabetes mellitus with unspecified complications: Secondary | ICD-10-CM

## 2022-12-28 NOTE — Telephone Encounter (Signed)
Medication Refill - Medication: gabapentin (NEURONTIN) 100 MG capsule [[id number]]   Pt is calling to report that she has 2 days of medication left  Has the patient contacted their pharmacy? Yes.   (Agent: If no, request that the patient contact the pharmacy for the refill. If patient does not wish to contact the pharmacy document the reason why and proceed with request.) (Agent: If yes, when and what did the pharmacy advise?)  Preferred Pharmacy (with phone number or street name):  [facility] - [place], [place] - [address]  [address] [place] [address]  Phone: [phone] Fax: [phone]  Hours: Not open 24 hours    Has the patient been seen for an appointment in the last year OR does the patient have an upcoming appointment? Yes.    Agent: Please be advised that RX refills may take up to 3 business days. We ask that you follow-up with your pharmacy.

## 2022-12-31 MED ORDER — GABAPENTIN 100 MG PO CAPS
ORAL_CAPSULE | ORAL | 3 refills | Status: AC
Start: 2022-12-31 — End: ?

## 2022-12-31 NOTE — Telephone Encounter (Signed)
Rx was not transmitted on 12/28/2022 so I sent it to [facility].  Requested Prescriptions  Pending Prescriptions Disp Refills   gabapentin (NEURONTIN) 100 MG capsule 30 capsule 3    Sig: TAKE 1 CAPSULE BY MOUTH ONCE DAILY     Neurology: Anticonvulsants - gabapentin Failed - 12/28/2022  2:52 PM      Failed - Cr in normal range and within 360 days    Creat  Date Value Ref Range Status  04/11/2022 0.94 0.60 - 0.95 mg/dL Final   Creatinine, Ser  Date Value Ref Range Status  12/21/2022 1.26 (H) 0.57 - 1.00 mg/dL Final         Passed - Completed PHQ-2 or PHQ-9 in the last 360 days      Passed - Valid encounter within last 12 months    Recent Outpatient Visits           1 week ago Stage 3a chronic kidney disease (HCC)   [place] [facility] [place], [place], PA-C   3 weeks ago Chronic fatigue and malaise   [place] [facility] Merita Norton T, FNP   1 month ago Type 2 diabetes mellitus without complication, without long-term current use of insulin (HCC)   [place] [facility] [place], [place], PA-C   2 months ago Type 2 diabetes mellitus without complication, without long-term current use of insulin [facility])   [place] [facility] [place], [place], PA-C       Future Appointments             In 1 month Agbor-Etang, Arlys John, MD [facility] [place]   In 2 months [place], Edmon Crape, PA-C [facility], [facility]

## 2023-01-01 ENCOUNTER — Encounter: Payer: Self-pay | Admitting: Student in an Organized Health Care Education/Training Program

## 2023-01-01 ENCOUNTER — Ambulatory Visit
Admission: RE | Admit: 2023-01-01 | Discharge: 2023-01-01 | Disposition: A | Discharge status: Home / Self Care | Payer: 59 | Source: Ambulatory Visit | Attending: Student in an Organized Health Care Education/Training Program | Admitting: Student in an Organized Health Care Education/Training Program

## 2023-01-01 ENCOUNTER — Ambulatory Visit (HOSPITAL_BASED_OUTPATIENT_CLINIC_OR_DEPARTMENT_OTHER): Payer: 59 | Admitting: Student in an Organized Health Care Education/Training Program

## 2023-01-01 VITALS — BP 111/60 | HR 96 | Temp 97.2°F | Resp 17 | Ht 62.0 in | Wt 223.0 lb

## 2023-01-01 DIAGNOSIS — M25561 Pain in right knee: Secondary | ICD-10-CM | POA: Insufficient documentation

## 2023-01-01 DIAGNOSIS — G8929 Other chronic pain: Secondary | ICD-10-CM | POA: Insufficient documentation

## 2023-01-01 DIAGNOSIS — M25511 Pain in right shoulder: Secondary | ICD-10-CM

## 2023-01-01 DIAGNOSIS — M545 Low back pain, unspecified: Secondary | ICD-10-CM

## 2023-01-01 DIAGNOSIS — M47816 Spondylosis without myelopathy or radiculopathy, lumbar region: Secondary | ICD-10-CM | POA: Diagnosis not present

## 2023-01-01 DIAGNOSIS — E1140 Type 2 diabetes mellitus with diabetic neuropathy, unspecified: Secondary | ICD-10-CM | POA: Insufficient documentation

## 2023-01-01 DIAGNOSIS — M4187 Other forms of scoliosis, lumbosacral region: Secondary | ICD-10-CM | POA: Diagnosis not present

## 2023-01-01 DIAGNOSIS — M19011 Primary osteoarthritis, right shoulder: Secondary | ICD-10-CM | POA: Diagnosis not present

## 2023-01-01 DIAGNOSIS — M1711 Unilateral primary osteoarthritis, right knee: Secondary | ICD-10-CM | POA: Diagnosis not present

## 2023-01-01 DIAGNOSIS — M5136 Other intervertebral disc degeneration, lumbar region: Secondary | ICD-10-CM | POA: Diagnosis not present

## 2023-01-01 DIAGNOSIS — M4316 Spondylolisthesis, lumbar region: Secondary | ICD-10-CM | POA: Diagnosis not present

## 2023-01-01 MED ORDER — GABAPENTIN 300 MG PO CAPS: 300.0000 mg | ORAL_CAPSULE | Freq: Every day | ORAL | 2 refills | Status: DC

## 2023-01-01 NOTE — Progress Notes (Signed)
Patient: Vanessa Vazquez  Service Category: E/M  Provider: Edward Jolly, MD  DOB: 20-Apr-1935  DOS: 01/01/2023  Referring Provider: Cherlynn Polo  MRN: [id number]  Setting: Ambulatory outpatient  PCP: Debera Lat, PA-C  Type: New Patient  Specialty: Interventional Pain Management    Location: Office  Delivery: Face-to-face     Primary Reason(s) for Visit: Encounter for initial evaluation of one or more chronic problems (new to examiner) potentially causing chronic pain, and posing a threat to normal musculoskeletal function. (Level of risk: High) CC: Shoulder Pain (right) and Back Pain (lower)  HPI  Ms. Vanessa Vazquez is a 88 y.o. year old, female patient, who comes for the first time to our practice referred by Debera Lat, PA-C for our initial evaluation of her chronic pain. She has Morbid obesity (HCC); Iron deficiency anemia secondary to inadequate dietary iron intake; Diabetic foot (HCC); AVM (arteriovenous malformation) of colon with hemorrhage; Chronic bilateral low back pain without sciatica; Chronic fatigue and malaise; Hypertension associated with diabetes (HCC); Frailty; Chronic right shoulder pain; Chronic pain of right knee; and Chronic painful diabetic neuropathy (HCC) on their problem list. Today she comes in for evaluation of her Shoulder Pain (right) and Back Pain (lower)  Pain Assessment: Location: Right Shoulder Radiating: when pt lifts right arm, pain in right hand Onset: More than a month ago Duration: Chronic pain Quality: Constant, Other (Comment) ("It (my shoulder) just hurts when I lift my arm") Severity: 10-Worst pain ever/10 (subjective, self-reported pain score)  Effect on ADL: "cannot unscrew a lid off a jar, I cannot hold or lift anything with right hand" Timing: Constant Modifying factors: gabapentin BP: 111/60  HR: 96  Onset and Duration: Gradual Cause of pain: Unknown Severity: NAS-11 at its worse: 10/10 Timing: Not influenced by the time of the  day Aggravating Factors: Lifiting Alleviating Factors: Sitting Associated Problems: Fatigue Quality of Pain: Aching and Uncomfortable Previous Examinations or Tests: The patient denies none listed Previous Treatments: The patient denies none listed  Vanessa Vazquez is being evaluated for possible interventional pain management therapies for the treatment of her chronic pain.   Patient is a pleasant 88 year old female accompanied by her daughter who presents with a chief complaint of right shoulder pain as well as pain in right hand.  She states that she has limited function such as opening jars or holding utensils.  She is currently on gabapentin 100 mg during the day (has been on it for the last month)  She also has right knee pain that is worse with weightbearing.  She does have type 2 diabetes. Does endorse burning and tingling in bilateral feet. Has not done PT or had any injections. She ambulates with a walker  [name] has been informed that this initial visit was an evaluation only.  On the follow up appointment I will go over the results, including ordered tests and available interventional therapies. At that time she will have the opportunity to decide whether to proceed with offered therapies or not. In the event that Vanessa Vazquez prefers avoiding interventional options, this will conclude our involvement in the case.  Medication management recommendations may be provided upon request.  Meds   Current Outpatient Medications:    albuterol (VENTOLIN HFA) 108 (90 Base) MCG/ACT inhaler, Inhale 2 puffs into the lungs every 4 (four) hours as needed for wheezing or shortness of breath., Disp: 18 g, Rfl: 6   ferrous sulfate 325 (65 FE) MG tablet, Take 325 mg by mouth once a week.,  Disp: , Rfl:    furosemide (LASIX) 20 MG tablet, Take 1 tablet (20 mg total) by mouth daily as needed., Disp: 90 tablet, Rfl: 3   gabapentin (NEURONTIN) 300 MG capsule, Take 1 capsule (300 mg total) by mouth at bedtime., Disp:  30 capsule, Rfl: 2   glimepiride (AMARYL) 2 MG tablet, TAKE 1 TABLET BY MOUTH TWICE DAILY, Disp: 180 tablet, Rfl: 3   JANUVIA 100 MG tablet, TAKE 1 TABLET BY MOUTH ONCE DAILY, Disp: 90 tablet, Rfl: 3   loratadine (CLARITIN) 10 MG tablet, Take 1 tablet (10 mg total) by mouth daily., Disp: 90 tablet, Rfl: 3   losartan-hydrochlorothiazide (HYZAAR) 50-12.5 MG tablet, Take 2 tablets by mouth daily., Disp: 180 tablet, Rfl: 0   metFORMIN (GLUCOPHAGE) 1000 MG tablet, TAKE 1 TABLET BY MOUTH TWICE DAILY, Disp: 180 tablet, Rfl: 3   Misc. Devices (WALKER) MISC, 1 each by Does not apply route daily., Disp: 1 each, Rfl: 0   omeprazole (PRILOSEC) 20 MG capsule, TAKE 1 CAPSULE BY MOUTH ONCE DAILY, Disp: 30 capsule, Rfl: 3   Respiratory Therapy Supplies (NEBULIZER) DEVI, 1 Device by Does not apply route 2 (two) times daily as needed., Disp: 1 each, Rfl: 0   rosuvastatin (CRESTOR) 5 MG tablet, Take 1 tablet (5 mg total) by mouth daily., Disp: 90 tablet, Rfl: 3  Imaging Review   Narrative CLINICAL DATA:  Fall with head and neck pain.  EXAM: CT HEAD WITHOUT CONTRAST  CT CERVICAL SPINE WITHOUT CONTRAST  TECHNIQUE: Multidetector CT imaging of the head and cervical spine was performed following the standard protocol without intravenous contrast. Multiplanar CT image reconstructions of the cervical spine were also generated.  COMPARISON:  CT head and cervical spine dated 02/05/2018.  FINDINGS: CT HEAD FINDINGS  Brain: No evidence of acute infarction, hemorrhage, hydrocephalus, extra-axial collection or mass lesion/mass effect.  Vascular: There are vascular calcifications in the carotid siphons.  Skull: Normal. Negative for fracture or focal lesion.  Sinuses/Orbits: No acute finding.  Other: Soft tissue swelling/hematoma of the right forehead and right periorbital region.  CT CERVICAL SPINE FINDINGS  Alignment: Normal.  Skull base and vertebrae: No acute fracture. No primary bone lesion or  focal pathologic process.  Soft tissues and spinal canal: No prevertebral fluid or swelling. No visible canal hematoma.  Disc levels: Up to moderate to severe multilevel degenerative disc and joint disease.  Upper chest: Negative.  Other: A hypoechoic right thyroid nodule measures 2.5 cm.  IMPRESSION: 1. No acute intracranial process. 2. No acute osseous injury in the cervical spine. 3. Hypoechoic right thyroid nodule measuring 2.5 cm. Recommend non emergent thyroid US (ref: J Am Coll Radiol. 2015 Feb;12(2): 143-50).   Electronically Signed By: Romona Curls M.D. On: 10/02/2020 13:50  DG Lumbar Spine Complete  Narrative CLINICAL DATA:  Right-sided low back pain radiating to the right hip for the past week without known injury  EXAM: LUMBAR SPINE - COMPLETE 4+ VIEW; DG HIP (WITH OR WITHOUT PELVIS) 2-3V RIGHT  COMPARISON:  Barium enema examination of August 06, 2013 which included images of the lumbar spine and pelvis.  FINDINGS: Lumbar Spine: The lumbar vertebral bodies are preserved in height. There is mild disc space narrowing at L4-5 and at L5-S1 as well as at T12-L1. There is no spondylolisthesis. There is facet joint hypertrophy at L4-5 and at L5-S1. The there is mild dextro curvature of the thoracolumbar spine. The observed portions of the sacrum are unremarkable.  Right hip: The bony pelvis is mildly  osteopenic. The sacrum and SI joints and pubic rami are intact. AP and frog-leg lateral views of the right hip reveal mild narrowing of the joint space medially. The articular surfaces of the femoral head and acetabulum remains smoothly rounded. The femoral neck and intertrochanteric and subtrochanteric regions are normal.  IMPRESSION: 1. Mild degenerative disc and facet joint change in the lower lumbar spine with degenerative disc space narrowing at T12-L1. There is no compression fracture or spondylolisthesis. 2. There is mild joint space narrowing of the  right hip. There is no acute bony abnormality.   Electronically Signed By: David  [place] M.D. On: 04/07/2015 12:05  DG Hip Unilat W or Wo Pelvis 2-3 Views Right  Narrative CLINICAL DATA:  88 year old female with right hip pain for 4 days. Pain with walking.  EXAM: DG HIP (WITH OR WITHOUT PELVIS) 2-3V RIGHT  COMPARISON:  Right hip series 04/07/15.  FINDINGS: Femoral heads remain normally located. Hip joint spaces appear stable and normal for age. The pelvis appears stable and intact. Sacral ala and SI joints appear stable and intact. Grossly intact proximal left femur. Intact proximal right femur.  IMPRESSION: No acute osseous abnormality identified about the right hip or pelvis.   Electronically Signed By: Odessa Fleming M.D. On: 11/22/2016 12:37   DG Hip Unilat W OR W/O Pelvis 2-3 Views Left  Narrative CLINICAL DATA:  Left hip pain.  EXAM: DG HIP (WITH OR WITHOUT PELVIS) 2-3V LEFT  COMPARISON:  None.  FINDINGS: There is no evidence of hip fracture or dislocation. Degenerative changes seen in the form of joint space narrowing and acetabular sclerosis.  IMPRESSION: No acute osseous abnormality.   Electronically Signed By: Aram Candela M.D. On: 03/25/2021 23:46  DG Knee Complete 4 Views Left  Narrative CLINICAL DATA:  Swelling. Pain for years, swelling started this morning.  EXAM: LEFT KNEE - COMPLETE 4+ VIEW  COMPARISON:  None.  FINDINGS: No fracture or dislocation. Moderate tricompartmental osteoarthritis with peripheral spurring. Mild medial tibiofemoral joint space narrowing. Possible small joint effusion. No erosion, bony destruction, or evidence of focal lesion. Small quadriceps and patellar tendon enthesophytes. Faint chondrocalcinosis. Mild generalized soft tissue edema.  IMPRESSION: 1. Moderate tricompartmental osteoarthritis. Possible small joint effusion. 2. Faint chondrocalcinosis. 3. Generalized soft tissue  edema.   Electronically Signed By: Narda Rutherford M.D. On: 09/29/2019 14:04   Complexity Note: Imaging results reviewed.                         ROS  Cardiovascular: High blood pressure Pulmonary or Respiratory: Wheezing and difficulty taking a deep full breath (Asthma) Neurological: No reported neurological signs or symptoms such as seizures, abnormal skin sensations, urinary and/or fecal incontinence, being born with an abnormal open spine and/or a tethered spinal cord Psychological-Psychiatric: Anxiousness Gastrointestinal: No reported gastrointestinal signs or symptoms such as vomiting or evacuating blood, reflux, heartburn, alternating episodes of diarrhea and constipation, inflamed or scarred liver, or pancreas or irrregular and/or infrequent bowel movements Genitourinary: No reported renal or genitourinary signs or symptoms such as difficulty voiding or producing urine, peeing blood, non-functioning kidney, kidney stones, difficulty emptying the bladder, difficulty controlling the flow of urine, or chronic kidney disease Hematological: Brusing easily Endocrine: No reported endocrine signs or symptoms such as high or low blood sugar, rapid heart rate due to high thyroid levels, obesity or weight gain due to slow thyroid or thyroid disease Rheumatologic: No reported rheumatological signs and symptoms such as fatigue, joint pain, tenderness,  swelling, redness, heat, stiffness, decreased range of motion, with or without associated rash Musculoskeletal: Negative for myasthenia gravis, muscular dystrophy, multiple sclerosis or malignant hyperthermia Work History: Retired  Allergies  Ms. Bose has No Known Allergies.  Laboratory Chemistry Profile   Renal Lab Results  Component Value Date   BUN 26 12/21/2022   CREATININE 1.26 (H) 12/21/2022   BCR 21 12/21/2022   GFRAA 72 11/23/2019   GFRNONAA 50 (L) 11/16/2022   SPECGRAV 1.025 10/01/2022   PHUR 5.5 10/01/2022   PROTEINUR  NEGATIVE 11/16/2022     Electrolytes Lab Results  Component Value Date   NA 139 12/21/2022   K 4.7 12/21/2022   CL 102 12/21/2022   CALCIUM 10.2 12/21/2022   MG 1.7 03/20/2016     Hepatic Lab Results  Component Value Date   AST 22 10/23/2022   ALT 20 10/23/2022   ALBUMIN 4.1 10/23/2022   ALKPHOS 101 10/23/2022     ID Lab Results  Component Value Date   SARSCOV2NAA POSITIVE (A) 10/19/2020     Bone No results found for: "VD25OH", "VD125OH2TOT", "ZO1096EA5", "WU9811BJ4", "25OHVITD1", "25OHVITD2", "25OHVITD3", "TESTOFREE", "TESTOSTERONE"   Endocrine Lab Results  Component Value Date   GLUCOSE 241 (H) 12/21/2022   GLUCOSEU NEGATIVE 11/16/2022   HGBA1C 6.0 (H) 10/23/2022   TSH 0.727 10/23/2022     Neuropathy Lab Results  Component Value Date   HGBA1C 6.0 (H) 10/23/2022     CNS No results found for: "COLORCSF", "APPEARCSF", "RBCCOUNTCSF", "WBCCSF", "POLYSCSF", "LYMPHSCSF", "EOSCSF", "PROTEINCSF", "GLUCCSF", "JCVIRUS", "CSFOLI", "IGGCSF", "LABACHR", "ACETBL"   Inflammation (CRP: Acute  ESR: Chronic) No results found for: "CRP", "ESRSEDRATE", "LATICACIDVEN"   Rheumatology No results found for: "RF", "ANA", "LABURIC", "URICUR", "LYMEIGGIGMAB", "LYMEABIGMQN", "HLAB27"   Coagulation Lab Results  Component Value Date   PLT 212 11/16/2022     Cardiovascular Lab Results  Component Value Date   BNP 35.6 01/01/2022   TROPONINI <0.03 09/06/2018   HGB 12.3 11/16/2022   HCT 39.2 11/16/2022     Screening Lab Results  Component Value Date   SARSCOV2NAA POSITIVE (A) 10/19/2020     Cancer No results found for: "CEA", "CA125", "LABCA2"   Allergens No results found for: "ALMOND", "APPLE", "ASPARAGUS", "AVOCADO", "BANANA", "BARLEY", "BASIL", "BAYLEAF", "GREENBEAN", "LIMABEAN", "WHITEBEAN", "BEEFIGE", "REDBEET", "BLUEBERRY", "BROCCOLI", "CABBAGE", "MELON", "CARROT", "CASEIN", "CASHEWNUT", "CAULIFLOWER", "CELERY"     Note: Lab results reviewed.  PFSH  Drug: Ms. Rea   reports no history of drug use. Alcohol:  reports no history of alcohol use. Tobacco:  reports that she has quit smoking. She has never used smokeless tobacco. Medical:  has a past medical history of Asthma, Diabetes mellitus without complication (HCC), GERD (gastroesophageal reflux disease), Hypercholesteremia, and Hypertension. Family: family history includes Asthma in her child; Diabetes in her sister; Heart attack in her mother; Heart disease in her mother; Leukemia in her sister; Lung cancer in her father.  Past Surgical History:  Procedure Laterality Date   CESAREAN SECTION     COLONOSCOPY WITH PROPOFOL N/A 03/19/2016   Procedure: COLONOSCOPY WITH PROPOFOL;  Surgeon: Scot Jun, MD;  [facility] END[facility]COPY;  Service: Endoscopy;  Laterality: N/A;   Active Ambulatory Problems    Diagnosis Date Noted   Morbid obesity (HCC) 11/23/2019   Iron deficiency anemia secondary to inadequate dietary iron intake 01/06/2020   Diabetic foot (HCC) 04/18/2021   AVM (arteriovenous malformation) of colon with hemorrhage 05/11/2016   Chronic bilateral low back pain without sciatica 10/24/2022   Chronic fatigue and malaise 12/07/2022  Hypertension associated with diabetes (HCC) 12/07/2022   Frailty 12/07/2022   Chronic right shoulder pain 01/01/2023   Chronic pain of right knee 01/01/2023   Chronic painful diabetic neuropathy (HCC) 01/01/2023   Resolved Ambulatory Problems    Diagnosis Date Noted   Essential hypertension    Hypercholesteremia    Asthma    Vaginal odor 12/14/2015   Vulvar itching 12/14/2015   Vaginal leukorrhea 12/14/2015   GI bleed 03/17/2016   Seasonal allergic rhinitis due to pollen 10/21/2019   Type 2 diabetes mellitus without complication, without long-term current use of insulin (HCC) 10/21/2019   Class 2 severe obesity due to excess calories with serious comorbidity in adult [facility]) 10/21/2019   Annual physical exam 01/06/2020   Fatigue 02/11/2020   Head injury  10/04/2020   Myalgia due to statin 04/18/2021   Need for Tdap vaccination 04/18/2021   Primary osteoarthritis of right knee 05/31/2021   Knee capsulitis, left 02/14/2022   Diabetes mellitus (HCC) 05/14/2022   Essential (primary) hypertension 05/08/2016   Hyperlipidemia 05/14/2022   Arthritis 05/14/2022   CKD (chronic kidney disease) stage 3, GFR 30-59 ml/min (HCC) 11/06/2022   Past Medical History:  Diagnosis Date   Diabetes mellitus without complication (HCC)    GERD (gastroesophageal reflux disease)    Hypertension    Constitutional Exam  General appearance: Well nourished, well developed, and well hydrated. In no apparent acute distress Vitals:   01/01/23 0934  BP: 111/60  Pulse: 96  Resp: 17  Temp: (!) 97.2 F (36.2 C)  TempSrc: Temporal  SpO2: 94%  Weight: 223 lb (101.2 kg)  Height: 5\' 2"  (1.575 m)   BMI Assessment: Estimated body mass index is 40.79 kg/m as calculated from the following:   Height as of this encounter: 5\' 2"  (1.575 m).   Weight as of this encounter: 223 lb (101.2 kg).  BMI interpretation table: BMI level Category Range association with higher incidence of chronic pain  <18 kg/m2 Underweight   18.5-24.9 kg/m2 Ideal body weight   25-29.9 kg/m2 Overweight Increased incidence by 20%  30-34.9 kg/m2 Obese (Class I) Increased incidence by 68%  35-39.9 kg/m2 Severe obesity (Class II) Increased incidence by 136%  >40 kg/m2 Extreme obesity (Class III) Increased incidence by 254%   Patient's current BMI Ideal Body weight  Body mass index is 40.79 kg/m. Ideal body weight: 50.1 kg (110 lb 7.2 oz) Adjusted ideal body weight: 70.5 kg (155 lb 7.5 oz)   BMI Readings from Last 4 Encounters:  01/01/23 40.79 kg/m  12/18/22 42.40 kg/m  12/07/22 41.70 kg/m  12/07/22 36.48 kg/m   Wt Readings from Last 4 Encounters:  01/01/23 223 lb (101.2 kg)  12/18/22 224 lb 6.4 oz (101.8 kg)  12/07/22 228 lb (103.4 kg)  12/07/22 226 lb (102.5 kg)    Psych/Mental  status: Alert, oriented x 3 (person, place, & time)       Eyes: PERLA Respiratory: No evidence of acute respiratory distress  Cervical Spine Area Exam  Skin & Axial Inspection: No masses, redness, edema, swelling, or associated skin lesions Alignment: Symmetrical Functional ROM: Unrestricted ROM      Stability: No instability detected Muscle Tone/Strength: Functionally intact. No obvious neuro-muscular anomalies detected. Sensory (Neurological): Unimpaired Palpation: No palpable anomalies             Upper Extremity (UE) Exam    Side: Right upper extremity  Side: Left upper extremity  Skin & Extremity Inspection: Skin color, temperature, and hair growth are WNL. No peripheral  edema or cyanosis. No masses, redness, swelling, asymmetry, or associated skin lesions. No contractures.  Skin & Extremity Inspection: Skin color, temperature, and hair growth are WNL. No peripheral edema or cyanosis. No masses, redness, swelling, asymmetry, or associated skin lesions. No contractures.  Functional ROM: Pain restricted ROM          Functional ROM: Unrestricted ROM          Muscle Tone/Strength: Functionally intact. No obvious neuro-muscular anomalies detected.  Muscle Tone/Strength: Functionally intact. No obvious neuro-muscular anomalies detected.  Sensory (Neurological): Arthropathic arthralgia          Sensory (Neurological): Unimpaired          Palpation: No palpable anomalies              Palpation: No palpable anomalies              Provocative Test(s):  Phalen's test: deferred Tinel's test: deferred Apley's scratch test (touch opposite shoulder):  Action 1 (Across chest): Decreased ROM Action 2 (Overhead): Decreased ROM Action 3 (LB reach): Decreased ROM   Provocative Test(s):  Phalen's test: deferred Tinel's test: deferred Apley's scratch test (touch opposite shoulder):  Action 1 (Across chest): deferred Action 2 (Overhead): deferred Action 3 (LB reach): deferred    Lumbar Spine Area  Exam  Skin & Axial Inspection: No masses, redness, or swelling Alignment: Symmetrical Functional ROM: Unrestricted ROM       Stability: No instability detected Muscle Tone/Strength: Functionally intact. No obvious neuro-muscular anomalies detected. Sensory (Neurological): Musculoskeletal pain pattern  Gait & Posture Assessment  Ambulation: Patient ambulates using a walker Gait: Antalgic gait (limping) Posture: Difficulty standing up straight, due to pain  Lower Extremity Exam    Side: Right lower extremity  Side: Left lower extremity  Stability: No instability observed          Stability: No instability observed          Skin & Extremity Inspection: Skin color, temperature, and hair growth are WNL. No peripheral edema or cyanosis. No masses, redness, swelling, asymmetry, or associated skin lesions. No contractures.  Skin & Extremity Inspection: Skin color, temperature, and hair growth are WNL. No peripheral edema or cyanosis. No masses, redness, swelling, asymmetry, or associated skin lesions. No contractures.  Functional ROM: Pain restricted ROM for hip joint          Functional ROM: Pain restricted ROM for hip joint          Muscle Tone/Strength: Functionally intact. No obvious neuro-muscular anomalies detected.  Muscle Tone/Strength: Functionally intact. No obvious neuro-muscular anomalies detected.  Sensory (Neurological): Paresthesia (Burning sensation)        Sensory (Neurological): Paresthesia (Burning sensation)        DTR: Patellar: deferred today Achilles: deferred today Plantar: deferred today  DTR: Patellar: deferred today Achilles: deferred today Plantar: deferred today  Palpation: No palpable anomalies  Palpation: No palpable anomalies    Assessment  Primary Diagnosis & Pertinent Problem List: The primary encounter diagnosis was Chronic right shoulder pain. Diagnoses of Chronic pain of right knee, Chronic painful diabetic neuropathy (HCC), and Chronic bilateral low back  pain without sciatica were also pertinent to this visit.  Visit Diagnosis (New problems to examiner): 1. Chronic right shoulder pain   2. Chronic pain of right knee   3. Chronic painful diabetic neuropathy (HCC)   4. Chronic bilateral low back pain without sciatica    Plan of Care (Initial workup plan)  General Recommendations: The pain condition that the patient  suffers from is best treated with a multidisciplinary approach that involves an increase in physical activity to prevent de-conditioning and worsening of the pain cycle, as well as psychological counseling (formal and/or informal) to address the co-morbid psychological affects of pain. Treatment will often involve interventional procedures to decrease the pain, allowing the patient to participate in the physical activity that will ultimately produce long-lasting pain reductions. The goal of the multidisciplinary approach is to return the patient to a higher level of overall function and to restore their ability to perform activities of daily living.   1. Chronic right shoulder pain - DG Shoulder Right; Future - Ambulatory referral to Physical Therapy  2. Chronic pain of right knee - DG Knee Complete 4 Views Right; Future - Ambulatory referral to Physical Therapy  3. Chronic painful diabetic neuropathy (HCC)  4. Chronic bilateral low back pain without sciatica - Ambulatory referral to Physical Therapy - DG Lumbar Spine Complete W/Bend; Future    Imaging Orders         DG Shoulder Right         DG Knee Complete 4 Views Right         DG Lumbar Spine Complete W/Bend     Referral Orders         Ambulatory referral to Physical Therapy     Pharmacotherapy (current): Medications ordered:  Meds ordered this encounter  Medications   gabapentin (NEURONTIN) 300 MG capsule    Sig: Take 1 capsule (300 mg total) by mouth at bedtime.    Dispense:  30 capsule    Refill:  2    Fill one day early if pharmacy is closed on scheduled  refill date. May substitute for generic if available.   Medications administered during this visit: Lewanda Rife had no medications administered during this visit.    Interventional management options: Ms. Verhalen was informed that there is no guarantee that she would be a candidate for interventional therapies. The decision will be based on the results of diagnostic studies, as well as Ms. Kovich's risk profile.  Procedure(s) under consideration:  Right shoulder injection Right suprascapular nerve block, RFA Right knee steroid injection Right genicular nerve block Q[place]    Provider-requested follow-up: Return in about 3 weeks (around 01/22/2023) for 2nd pt visit (review Xrays, PT).  Future Appointments  Date Time Provider Department Center  01/02/2023  2:00 PM [facility] [place] [facility] None  01/22/2023  9:40 AM Edward Jolly, MD [facility] None  02/08/2023  9:40 AM Debbe Odea, MD [facility] None  02/08/2023 11:00 AM Noemi Chapel, NP [facility] None  03/20/2023  8:40 AM Debera Lat, PA-C [facility]  03/25/2023  1:15 PM Freddie Breech, DPM [facility]    Duration of encounter: 60 minutes.  Total time on encounter, as per AMA guidelines included both the face-to-face and non-face-to-face time personally spent by the physician and/or other qualified health care professional(s) on the day of the encounter (includes time in activities that require the physician or other qualified health care professional and does not include time in activities normally performed by clinical staff). Physician's time may include the following activities when performed: Preparing to see the patient (e.g., pre-charting review of records, searching for previously ordered imaging, lab work, and nerve conduction tests) Review of prior analgesic pharmacotherapies. Reviewing PMP Interpreting ordered tests (e.g., lab work, imaging, nerve conduction tests) Performing post-procedure evaluations,  including interpretation of diagnostic procedures Obtaining and/or reviewing separately obtained history Performing a medically  appropriate examination and/or evaluation Counseling and educating the patient/family/caregiver Ordering medications, tests, or procedures Referring and communicating with other health care professionals (when not separately reported) Documenting clinical information in the electronic or other health record Independently interpreting results (not separately reported) and communicating results to the patient/ family/caregiver Care coordination (not separately reported)  Note by: Edward Jolly, MD (TTS technology used. I apologize for any typographical errors that were not detected and corrected.) Date: 01/01/2023; Time: 10:40 AM

## 2023-01-01 NOTE — Progress Notes (Signed)
Safety precautions to be maintained throughout the outpatient stay will include: orient to surroundings, keep bed in low position, maintain call bell within reach at all times, provide assistance with transfer out of bed and ambulation.  

## 2023-01-01 NOTE — Patient Instructions (Signed)
You have been referred to physical therapy.  You will have xrays completed prior to next appt with pain clinic.

## 2023-01-02 ENCOUNTER — Ambulatory Visit: Payer: 59 | Attending: Cardiology

## 2023-01-02 DIAGNOSIS — R0600 Dyspnea, unspecified: Secondary | ICD-10-CM

## 2023-01-15 ENCOUNTER — Ambulatory Visit (INDEPENDENT_AMBULATORY_CARE_PROVIDER_SITE_OTHER): Payer: 59

## 2023-01-15 VITALS — Ht 62.0 in | Wt 223.0 lb

## 2023-01-15 DIAGNOSIS — Z0000 Encounter for general adult medical examination without abnormal findings: Secondary | ICD-10-CM | POA: Diagnosis not present

## 2023-01-15 NOTE — Progress Notes (Signed)
Subjective:   Vanessa Vazquez is a 88 y.o. female who presents for Medicare Annual (Subsequent) preventive examination.  Visit Complete: Virtual  I connected with  Lewanda Rife on 01/15/23 by a audio enabled telemedicine application and verified that I am speaking with the correct person using two identifiers.  Patient Location: Home  Provider Location: Office/Clinic  I discussed the limitations of evaluation and management by telemedicine. The patient expressed understanding and agreed to proceed.  Vital Signs: Unable to obtain new vitals due to this being a telehealth visit.  Patient Medicare AWV questionnaire was completed by the patient on (not done); I have confirmed that all information answered by patient is correct and no changes since this date.  Review of Systems    Cardiac Risk Factors include: advanced age (>9men, >49 women);diabetes mellitus;hypertension;obesity (BMI >30kg/m2);sedentary lifestyle     Objective:    Today's Vitals   01/15/23 1305  Weight: 223 lb (101.2 kg)  Height: 5\' 2"  (1.575 m)  PainSc: 6    Body mass index is 40.79 kg/m.     01/15/2023    1:14 PM 01/01/2023    9:32 AM 11/16/2022   11:15 AM 08/30/2022   10:54 AM 05/14/2022    9:49 AM 01/01/2022   11:22 AM 11/21/2021    8:37 AM  Advanced Directives  Does Patient Have a Medical Advance Directive? No No No No No No No  Would patient like information on creating a medical advance directive?  Yes (MAU/Ambulatory/Procedural Areas - Information given)   No - Patient declined No - Patient declined No - Patient declined    Current Medications (verified) Outpatient Encounter Medications as of 01/15/2023  Medication Sig   albuterol (VENTOLIN HFA) 108 (90 Base) MCG/ACT inhaler Inhale 2 puffs into the lungs every 4 (four) hours as needed for wheezing or shortness of breath.   ferrous sulfate 325 (65 FE) MG tablet Take 325 mg by mouth once a week.   furosemide (LASIX) 20 MG tablet Take 1 tablet (20 mg total)  by mouth daily as needed.   gabapentin (NEURONTIN) 300 MG capsule Take 1 capsule (300 mg total) by mouth at bedtime.   glimepiride (AMARYL) 2 MG tablet TAKE 1 TABLET BY MOUTH TWICE DAILY   JANUVIA 100 MG tablet TAKE 1 TABLET BY MOUTH ONCE DAILY   loratadine (CLARITIN) 10 MG tablet Take 1 tablet (10 mg total) by mouth daily.   losartan-hydrochlorothiazide (HYZAAR) 50-12.5 MG tablet Take 2 tablets by mouth daily.   metFORMIN (GLUCOPHAGE) 1000 MG tablet TAKE 1 TABLET BY MOUTH TWICE DAILY   Misc. Devices (WALKER) MISC 1 each by Does not apply route daily.   omeprazole (PRILOSEC) 20 MG capsule TAKE 1 CAPSULE BY MOUTH ONCE DAILY   Respiratory Therapy Supplies (NEBULIZER) DEVI 1 Device by Does not apply route 2 (two) times daily as needed.   rosuvastatin (CRESTOR) 5 MG tablet Take 1 tablet (5 mg total) by mouth daily.   No facility-administered encounter medications on file as of 01/15/2023.    Allergies (verified) Patient has no known allergies.   History: Past Medical History:  Diagnosis Date   Asthma    Diabetes mellitus without complication (HCC)    GERD (gastroesophageal reflux disease)    Hypercholesteremia    Hypertension    Past Surgical History:  Procedure Laterality Date   CESAREAN SECTION     COLONOSCOPY WITH PROPOFOL N/A 03/19/2016   Procedure: COLONOSCOPY WITH PROPOFOL;  Surgeon: Scot Jun, MD;  Location[facility]  ENDOSCOPY;  Service: Endoscopy;  Laterality: N/A;   Family History  Problem Relation Age of Onset   Heart attack Mother    Heart disease Mother    Lung cancer Father    Leukemia Sister    Diabetes Sister    Asthma Child    Social History   Socioeconomic History   Marital status: Widowed    Spouse name: Not on file   Number of children: Not on file   Years of education: Not on file   Highest education level: GED or equivalent  Occupational History   Not on file  Tobacco Use   Smoking status: Former   Smokeless tobacco: Never  Vaping Use    Vaping status: Never Used  Substance and Sexual Activity   Alcohol use: No    Alcohol/week: 0.0 standard drinks of alcohol   Drug use: No   Sexual activity: Never  Other Topics Concern   Not on file  Social History Narrative   Not on file   Social Determinants of Health   Financial Resource Strain: Low Risk  (01/15/2023)   Overall Financial Resource Strain (CARDIA)    Difficulty of Paying Living Expenses: Not hard at all  Food Insecurity: No Food Insecurity (01/15/2023)   Hunger Vital Sign    Worried About Running Out of Food in the Last Year: Never true    Ran Out of Food in the Last Year: Never true  Transportation Needs: No Transportation Needs (01/15/2023)   PRAPARE - Administrator, Civil Service (Medical): No    Lack of Transportation (Non-Medical): No  Physical Activity: Inactive (01/15/2023)   Exercise Vital Sign    Days of Exercise per Week: 0 days    Minutes of Exercise per Session: 0 min  Stress: Stress Concern Present (01/15/2023)   [employer] of Occupational Health - Occupational Stress Questionnaire    Feeling of Stress : To some extent  Social Connections: Socially Isolated (01/15/2023)   Social Connection and Isolation Panel [NHANES]    Frequency of Communication with Friends and Family: Twice a week    Frequency of Social Gatherings with Friends and Family: Never    Attends Religious Services: More than 4 times per year    Active Member of [employer] or Organizations: No    Attends Banker Meetings: Never    Marital Status: Widowed    Tobacco Counseling Counseling given: Not Answered  Clinical Intake:  Pre-visit preparation completed: Yes  Pain : 0-10 Pain Score: 6  Pain Type: Chronic pain Pain Location: Shoulder Pain Orientation: Left Pain Descriptors / Indicators: Aching Pain Onset: More than a month ago Pain Frequency: Constant Pain Relieving Factors: medication, PT  Pain Relieving Factors: medication, PT  BMI -  recorded: 40.79 Nutritional Status: BMI > 30  Obese Nutritional Risks: None Diabetes: Yes CBG done?: No Did pt. bring in CBG monitor from home?: No  How often do you need to have someone help you when you read instructions, pamphlets, or other written materials from your doctor or pharmacy?: 1 - Never  Interpreter Needed?: No  Comments: lives alone Information entered by :: B.Takia Runyon,LPN   Activities of Daily Living    01/15/2023    1:15 PM 10/23/2022    3:28 PM  In your present state of health, do you have any difficulty performing the following activities:  Hearing? 0 0  Vision? 0 0  Difficulty concentrating or making decisions? 1 0  Walking or climbing stairs? 1 0  Dressing or bathing? 0 0  Doing errands, shopping? 0 0  Preparing Food and eating ? N   Using the Toilet? N   In the past six months, have you accidently leaked urine? N   Do you have problems with loss of bowel control? N   Managing your Medications? N   Managing your Finances? N   Housekeeping or managing your Housekeeping? N     Patient Care Team: Debera Lat, PA-C as PCP - General (Physician Assistant) Debbe Odea, MD as PCP - Cardiology (Cardiology)  Indicate any recent Medical Services you may have received from other than [facility] providers in the past year (date may be approximate).     Assessment:   This is a routine wellness examination for [employer].  Hearing/Vision screen Hearing Screening - Comments:: Adequate hearing Vision Screening - Comments:: Adequate vision w/glasses  Dietary issues and exercise activities discussed:     Goals Addressed             This Visit's Progress    DIET - EAT MORE FRUITS AND VEGETABLES   On track      Depression Screen    01/15/2023    1:10 PM 01/01/2023    9:33 AM 12/18/2022   10:59 AM 05/14/2022    9:48 AM 07/31/2021   10:02 AM 05/11/2021   10:07 AM 04/18/2021    8:57 AM  PHQ 2/9 Scores  PHQ - 2 Score 0 0 0 0 0 0 0  PHQ- 9 Score    0        Fall Risk    01/15/2023    1:07 PM 01/01/2023    9:33 AM 12/18/2022   10:59 AM 12/07/2022   10:46 AM 10/23/2022    3:28 PM  Fall Risk   Falls in the past year? 0 0 0 0 0  Number falls in past yr: 0   0 0  Injury with Fall? 0  0 0 0  Risk for fall due to : No Fall Risks  No Fall Risks No Fall Risks   Follow up Education provided;Falls prevention discussed  Falls evaluation completed Falls evaluation completed     MEDICARE RISK AT HOME: Medicare Risk at Home Any stairs in or around the home?: No If so, are there any without handrails?: No Home free of loose throw rugs in walkways, pet beds, electrical cords, etc?: Yes Adequate lighting in your home to reduce risk of falls?: Yes Life alert?: Yes Use of a cane, walker or w/c?: Yes Grab bars in the bathroom?: Yes Shower chair or bench in shower?: Yes Elevated toilet seat or a handicapped toilet?: Yes  TIMED UP AND GO:  Was the test performed?  No    Cognitive Function:    05/11/2021   10:09 AM  MMSE - Mini Mental State Exam  Not completed: Unable to complete        01/15/2023    1:16 PM 05/14/2022    9:54 AM 05/11/2021   10:09 AM  6CIT Screen  What Year? 0 points 0 points 0 points  What month? 0 points 0 points 0 points  What time? 0 points 0 points 0 points  Count back from 20 0 points 0 points 0 points  Months in reverse 0 points 0 points 0 points  Repeat phrase 4 points 2 points 0 points  Total Score 4 points 2 points 0 points    Immunizations Immunization History  Administered Date(s) Administered   Influenza, Seasonal, Injecte,  Preservative Fre 04/23/2006, 03/25/2007, 03/10/2009, 03/07/2010   Pneumococcal Polysaccharide-23 04/12/2003    TDAP status: Up to date  Flu Vaccine status: Due, Education has been provided regarding the importance of this vaccine. Advised may receive this vaccine at local pharmacy or Health Dept. Aware to provide a copy of the vaccination record if obtained from local pharmacy or  Health Dept. Verbalized acceptance and understanding.  Pneumococcal vaccine status: Up to date  Covid-19 vaccine status: Declined, Education has been provided regarding the importance of this vaccine but patient still declined. Advised may receive this vaccine at local pharmacy or Health Dept.or vaccine clinic. Aware to provide a copy of the vaccination record if obtained from local pharmacy or Health Dept. Verbalized acceptance and understanding.  Qualifies for Shingles Vaccine? Yes   Zostavax completed No   Shingrix Completed?: No.    Education has been provided regarding the importance of this vaccine. Patient has been advised to call insurance company to determine out of pocket expense if they have not yet received this vaccine. Advised may also receive vaccine at local pharmacy or Health Dept. Verbalized acceptance and understanding.  Screening Tests Health Maintenance  Topic Date Due   COVID-19 Vaccine (1) Never done   DTaP/Tdap/Td (1 - Tdap) Never done   Zoster Vaccines- Shingrix (1 of 2) Never done   Pneumonia Vaccine 15+ Years old (2 of 2 - PCV) 04/11/2004   HEMOGLOBIN A1C  04/25/2023   OPHTHALMOLOGY EXAM  11/21/2023   Diabetic kidney evaluation - eGFR measurement  12/21/2023   Diabetic kidney evaluation - Urine ACR  12/21/2023   FOOT EXAM  12/21/2023   Medicare Annual Wellness (AWV)  01/15/2024   DEXA SCAN  Completed   HPV VACCINES  Aged Out   INFLUENZA VACCINE  Discontinued    Health Maintenance  Health Maintenance Due  Topic Date Due   COVID-19 Vaccine (1) Never done   DTaP/Tdap/Td (1 - Tdap) Never done   Zoster Vaccines- Shingrix (1 of 2) Never done   Pneumonia Vaccine 39+ Years old (2 of 2 - PCV) 04/11/2004    Colorectal cancer screening: No longer required.   Mammogram status: No longer required due to age.  Bone Density status: Completed yes. Results reflect: Bone density results: NORMAL. Repeat every yes years.  Lung Cancer Screening: (Low Dose CT Chest  recommended if Age 14-80 years, 20 pack-year currently smoking OR have quit w/in 15years.) does not qualify.   Lung Cancer Screening Referral: no  Additional Screening:  Hepatitis C Screening: does not qualify; Completed yes  Vision Screening: Recommended annual ophthalmology exams for early detection of glaucoma and other disorders of the eye. Is the patient up to date with their annual eye exam?  Yes  Who is the provider or what is the name of the office in which the patient attends annual eye exams? [facility] If pt is not established with a provider, would they like to be referred to a provider to establish care? No .   Dental Screening: Recommended annual dental exams for proper oral hygiene  Diabetic Foot Exam: Diabetic Foot Exam: Completed yes  Community Resource Referral / Chronic Care Management: CRR required this visit?  No   CCM required this visit?  No    Plan:     I have personally reviewed and noted the following in the patient's chart:   Medical and social history Use of alcohol, tobacco or illicit drugs  Current medications and supplements including opioid prescriptions. Patient is not currently  taking opioid prescriptions. Functional ability and status Nutritional status Physical activity Advanced directives List of other physicians Hospitalizations, surgeries, and ER visits in previous 12 months Vitals Screenings to include cognitive, depression, and falls Referrals and appointments  In addition, I have reviewed and discussed with patient certain preventive protocols, quality metrics, and best practice recommendations. A written personalized care plan for preventive services as well as general preventive health recommendations were provided to patient.    Sue Lush, LPN   [id number]   After Visit Summary: (Declined) Due to this being a telephonic visit, with patients personalized plan was offered to patient but patient Declined AVS at this time    Nurse Notes: The patient states she is doing well and has no concerns or questions at this time.

## 2023-01-15 NOTE — Patient Instructions (Signed)
Vanessa Vazquez , Thank you for taking time to come for your Medicare Wellness Visit. I appreciate your ongoing commitment to your health goals. Please review the following plan we discussed and let me know if I can assist you in the future.   Referrals/Orders/Follow-Ups/Clinician Recommendations: none  This is a list of the screening recommended for you and due dates:  Health Maintenance  Topic Date Due   COVID-19 Vaccine (1) Never done   DTaP/Tdap/Td vaccine (1 - Tdap) Never done   Zoster (Shingles) Vaccine (1 of 2) Never done   Pneumonia Vaccine (2 of 2 - PCV) 04/11/2004   Hemoglobin A1C  04/25/2023   Eye exam for diabetics  11/21/2023   Yearly kidney function blood test for diabetes  12/21/2023   Yearly kidney health urinalysis for diabetes  12/21/2023   Complete foot exam   12/21/2023   Medicare Annual Wellness Visit  01/15/2024   DEXA scan (bone density measurement)  Completed   HPV Vaccine  Aged Out   Flu Shot  Discontinued    Advanced directives: (Declined) Advance directive discussed with you today. Even though you declined this today, please call our office should you change your mind, and we can give you the proper paperwork for you to fill out.  Next Medicare Annual Wellness Visit scheduled for next year: Yes 01/20/24 @ 1pm telephone  Preventive Care 65 Years and Older, Female Preventive care refers to lifestyle choices and visits with your health care provider that can promote health and wellness. What does preventive care include? A yearly physical exam. This is also called an annual well check. Dental exams once or twice a year. Routine eye exams. Ask your health care provider how often you should have your eyes checked. Personal lifestyle choices, including: Daily care of your teeth and gums. Regular physical activity. Eating a healthy diet. Avoiding tobacco and drug use. Limiting alcohol use. Practicing safe sex. Taking low-dose aspirin every day. Taking vitamin and  mineral supplements as recommended by your health care provider. What happens during an annual well check? The services and screenings done by your health care provider during your annual well check will depend on your age, overall health, lifestyle risk factors, and family history of disease. Counseling  Your health care provider may ask you questions about your: Alcohol use. Tobacco use. Drug use. Emotional well-being. Home and relationship well-being. Sexual activity. Eating habits. History of falls. Memory and ability to understand (cognition). Work and work Astronomer. Reproductive health. Screening  You may have the following tests or measurements: Height, weight, and BMI. Blood pressure. Lipid and cholesterol levels. These may be checked every 5 years, or more frequently if you are over 10 years old. Skin check. Lung cancer screening. You may have this screening every year starting at age 45 if you have a 30-pack-year history of smoking and currently smoke or have quit within the past 15 years. Fecal occult blood test (FOBT) of the stool. You may have this test every year starting at age 79. Flexible sigmoidoscopy or colonoscopy. You may have a sigmoidoscopy every 5 years or a colonoscopy every 10 years starting at age 70. Hepatitis C blood test. Hepatitis B blood test. Sexually transmitted disease (STD) testing. Diabetes screening. This is done by checking your blood sugar (glucose) after you have not eaten for a while (fasting). You may have this done every 1-3 years. Bone density scan. This is done to screen for osteoporosis. You may have this done starting at age 75.  Mammogram. This may be done every 1-2 years. Talk to your health care provider about how often you should have regular mammograms. Talk with your health care provider about your test results, treatment options, and if necessary, the need for more tests. Vaccines  Your health care provider may recommend  certain vaccines, such as: Influenza vaccine. This is recommended every year. Tetanus, diphtheria, and acellular pertussis (Tdap, Td) vaccine. You may need a Td booster every 10 years. Zoster vaccine. You may need this after age 4. Pneumococcal 13-valent conjugate (PCV13) vaccine. One dose is recommended after age 10. Pneumococcal polysaccharide (PPSV23) vaccine. One dose is recommended after age 64. Talk to your health care provider about which screenings and vaccines you need and how often you need them. This information is not intended to replace advice given to you by your health care provider. Make sure you discuss any questions you have with your health care provider. Document Released: 06/10/2015 Document Revised: 02/01/2016 Document Reviewed: 03/15/2015 Elsevier Interactive Patient Education  2017 [employer].  Fall Prevention in the Home Falls can cause injuries. They can happen to people of all ages. There are many things you can do to make your home safe and to help prevent falls. What can I do on the outside of my home? Regularly fix the edges of walkways and driveways and fix any cracks. Remove anything that might make you trip as you walk through a door, such as a raised step or threshold. Trim any bushes or trees on the path to your home. Use bright outdoor lighting. Clear any walking paths of anything that might make someone trip, such as rocks or tools. Regularly check to see if handrails are loose or broken. Make sure that both sides of any steps have handrails. Any raised decks and porches should have guardrails on the edges. Have any leaves, snow, or ice cleared regularly. Use sand or salt on walking paths during winter. Clean up any spills in your garage right away. This includes oil or grease spills. What can I do in the bathroom? Use night lights. Install grab bars by the toilet and in the tub and shower. Do not use towel bars as grab bars. Use non-skid mats or  decals in the tub or shower. If you need to sit down in the shower, use a plastic, non-slip stool. Keep the floor dry. Clean up any water that spills on the floor as soon as it happens. Remove soap buildup in the tub or shower regularly. Attach bath mats securely with double-sided non-slip rug tape. Do not have throw rugs and other things on the floor that can make you trip. What can I do in the bedroom? Use night lights. Make sure that you have a light by your bed that is easy to reach. Do not use any sheets or blankets that are too big for your bed. They should not hang down onto the floor. Have a firm chair that has side arms. You can use this for support while you get dressed. Do not have throw rugs and other things on the floor that can make you trip. What can I do in the kitchen? Clean up any spills right away. Avoid walking on wet floors. Keep items that you use a lot in easy-to-reach places. If you need to reach something above you, use a strong step stool that has a grab bar. Keep electrical cords out of the way. Do not use floor polish or wax that makes floors slippery. If  you must use wax, use non-skid floor wax. Do not have throw rugs and other things on the floor that can make you trip. What can I do with my stairs? Do not leave any items on the stairs. Make sure that there are handrails on both sides of the stairs and use them. Fix handrails that are broken or loose. Make sure that handrails are as long as the stairways. Check any carpeting to make sure that it is firmly attached to the stairs. Fix any carpet that is loose or worn. Avoid having throw rugs at the top or bottom of the stairs. If you do have throw rugs, attach them to the floor with carpet tape. Make sure that you have a light switch at the top of the stairs and the bottom of the stairs. If you do not have them, ask someone to add them for you. What else can I do to help prevent falls? Wear shoes that: Do not  have high heels. Have rubber bottoms. Are comfortable and fit you well. Are closed at the toe. Do not wear sandals. If you use a stepladder: Make sure that it is fully opened. Do not climb a closed stepladder. Make sure that both sides of the stepladder are locked into place. Ask someone to hold it for you, if possible. Clearly mark and make sure that you can see: Any grab bars or handrails. First and last steps. Where the edge of each step is. Use tools that help you move around (mobility aids) if they are needed. These include: Canes. Walkers. Scooters. Crutches. Turn on the lights when you go into a dark area. Replace any light bulbs as soon as they burn out. Set up your furniture so you have a clear path. Avoid moving your furniture around. If any of your floors are uneven, fix them. If there are any pets around you, be aware of where they are. Review your medicines with your doctor. Some medicines can make you feel dizzy. This can increase your chance of falling. Ask your doctor what other things that you can do to help prevent falls. This information is not intended to replace advice given to you by your health care provider. Make sure you discuss any questions you have with your health care provider. Document Released: 03/10/2009 Document Revised: 10/20/2015 Document Reviewed: 06/18/2014 Elsevier Interactive Patient Education  2017 [employer].

## 2023-01-22 ENCOUNTER — Encounter: Payer: Self-pay | Admitting: Student in an Organized Health Care Education/Training Program

## 2023-01-22 ENCOUNTER — Ambulatory Visit
Payer: 59 | Attending: Student in an Organized Health Care Education/Training Program | Admitting: Student in an Organized Health Care Education/Training Program

## 2023-01-22 VITALS — BP 130/78 | HR 96 | Temp 97.3°F | Resp 16 | Ht 62.0 in | Wt 223.0 lb

## 2023-01-22 DIAGNOSIS — G894 Chronic pain syndrome: Secondary | ICD-10-CM | POA: Diagnosis not present

## 2023-01-22 DIAGNOSIS — M1711 Unilateral primary osteoarthritis, right knee: Secondary | ICD-10-CM | POA: Insufficient documentation

## 2023-01-22 DIAGNOSIS — M47816 Spondylosis without myelopathy or radiculopathy, lumbar region: Secondary | ICD-10-CM | POA: Diagnosis not present

## 2023-01-22 DIAGNOSIS — M19011 Primary osteoarthritis, right shoulder: Secondary | ICD-10-CM | POA: Insufficient documentation

## 2023-01-22 NOTE — Progress Notes (Signed)
PROVIDER NOTE: Information contained herein reflects review and annotations entered in association with encounter. Interpretation of such information and data should be left to medically-trained personnel. Information provided to patient can be located elsewhere in the medical record under "Patient Instructions". Document created using STT-dictation technology, any transcriptional errors that may result from process are unintentional.    Patient: Vanessa Vazquez  Service Category: E/M  Provider: Edward Jolly, MD  DOB: 1935/05/02  DOS: 01/22/2023  Referring Provider: Cherlynn Vazquez  MRN: [id number]  Specialty: Interventional Pain Management  PCP: Vanessa Lat, PA-C  Type: Established Patient  Setting: Ambulatory outpatient    Location: Office  Delivery: Face-to-face     Primary Reason(s) for Visit: Encounter for evaluation before starting new chronic pain management plan of care (Level of risk: moderate) CC: Back Pain (Lumbar bilateral ), Knee Pain (Right ), and Shoulder Pain (Right )  HPI  [name] is a 88 y.o. year old, female patient, who comes today for a follow-up evaluation to review the test results and decide on a treatment plan. She has Morbid obesity (HCC); Iron deficiency anemia secondary to inadequate dietary iron intake; Diabetic foot (HCC); Primary osteoarthritis of right knee; AVM (arteriovenous malformation) of colon with hemorrhage; Chronic bilateral low back pain without sciatica; Chronic fatigue and malaise; Hypertension associated with diabetes (HCC); Frailty; Chronic right shoulder pain; Chronic pain of right knee; Chronic painful diabetic neuropathy (HCC); Lumbar facet arthropathy; Primary osteoarthritis of right shoulder; and Chronic pain syndrome on their problem list. Her primarily concern today is the Back Pain (Lumbar bilateral ), Knee Pain (Right ), and Shoulder Pain (Right )  Pain Assessment: Location: Lower, Left, Right Back (right shoulder, right knee) Radiating:  shoulder pain down right arm and into hand causing her to not be able to write with it anymore. Onset: More than a month ago Duration: Chronic pain Quality: Discomfort, Constant ("it just hurts"  some weakness in right hand.) Severity: 6 /10 (subjective, self-reported pain score)  Effect on ADL: weakness in right hand.  bending and stooping aggravates the back pain Timing: Constant Modifying factors: nothing currently BP: 130/78  HR: 96  [name] comes in today for a follow-up visit after her initial evaluation on 01/01/2023. Today we went over the results of her tests. These were explained in "Layman's terms". During today's appointment we went over my diagnostic impression, as well as the proposed treatment plan.  Patient follows up today for her second patient visit.  She continues to have right shoulder pain, axial low back pain as well as right knee pain.  We reviewed her x-ray studies as below.  She does have right shoulder osteoarthritis, right knee osteoarthritis and lumbar spondylosis and degenerative disc disease.  HPI from initial clinic visit: Patient is a pleasant 88 year old female accompanied by her daughter who presents with a chief complaint of right shoulder pain as well as pain in right hand.  She states that she has limited function such as opening jars or holding utensils.  She is currently on gabapentin 100 mg during the day (has been on it for the last month)  She also has right knee pain that is worse with weightbearing.  She does have type 2 diabetes. Does endorse burning and tingling in bilateral feet. Has not done PT or had any injections. She ambulates with a walker   Patient presented with interventional treatment options. [name] was informed that I will not be providing medication management. Pharmacotherapy evaluation including recommendations may be  offered, if specifically requested.   Controlled Substance Pharmacotherapy Assessment REMS (Risk Evaluation and  Mitigation Strategy)    Undesirable effects: Side-effects or Adverse reactions: None reported Monitoring: [place] PMP: PDMP not reviewed this encounter. Online review of the past 39-month period previously conducted. Not applicable at this point since we have not taken over the patient's medication management yet. List of other Serum/Urine Drug Screening Test(s):  Lab Results  Component Value Date   ETH <10 09/06/2018   List of all UDS test(s) done:  No results found for: "TOXASSSELUR", "SUMMARY" Last UDS on record: No results found for: "TOXASSSELUR", "SUMMARY" UDS interpretation: No unexpected findings.          Medication Assessment Form: Not applicable. No opioids. Treatment compliance: Not applicable Risk Assessment Profile: Aberrant behavior: See initial evaluations. None observed or detected today Comorbid factors increasing risk of overdose: See initial evaluation. No additional risks detected today Opioid risk tool (ORT):     01/01/2023    9:33 AM  Opioid Risk   Alcohol 1  Illegal Drugs 0  Rx Drugs 0  Alcohol 0  Illegal Drugs 0  Rx Drugs 0  Age between 16-45 years  0  Psychological Disease 0  Depression 0  Opioid Risk Tool Scoring 1  Opioid Risk Interpretation Low Risk    ORT Scoring interpretation table:  Score <3 = Low Risk for SUD  Score between 4-7 = Moderate Risk for SUD  Score >8 = High Risk for Opioid Abuse   Risk of substance use disorder (SUD): Low  Risk Mitigation Strategies:  Patient opioid safety counseling: No controlled substances prescribed. Patient-Prescriber Agreement (PPA): No agreement signed.  Controlled substance notification to other providers: None required. No opioid therapy.  Pharmacologic Plan: Non-opioid analgesic therapy offered. Interventional alternatives discussed.             Laboratory Chemistry Profile   Renal Lab Results  Component Value Date   BUN 26 12/21/2022   CREATININE 1.26 (H) 12/21/2022   BCR 21 12/21/2022    GFRAA 72 11/23/2019   GFRNONAA 50 (L) 11/16/2022   SPECGRAV 1.025 10/01/2022   PHUR 5.5 10/01/2022   PROTEINUR NEGATIVE 11/16/2022     Electrolytes Lab Results  Component Value Date   NA 139 12/21/2022   K 4.7 12/21/2022   CL 102 12/21/2022   CALCIUM 10.2 12/21/2022   MG 1.7 03/20/2016     Hepatic Lab Results  Component Value Date   AST 22 10/23/2022   ALT 20 10/23/2022   ALBUMIN 4.1 10/23/2022   ALKPHOS 101 10/23/2022     ID Lab Results  Component Value Date   SARSCOV2NAA POSITIVE (A) 10/19/2020     Bone No results found for: "VD25OH", "VD125OH2TOT", "VW0981XB1", "YN8295AO1", "25OHVITD1", "25OHVITD2", "25OHVITD3", "TESTOFREE", "TESTOSTERONE"   Endocrine Lab Results  Component Value Date   GLUCOSE 241 (H) 12/21/2022   GLUCOSEU NEGATIVE 11/16/2022   HGBA1C 6.0 (H) 10/23/2022   TSH 0.727 10/23/2022     Neuropathy Lab Results  Component Value Date   HGBA1C 6.0 (H) 10/23/2022     CNS No results found for: "COLORCSF", "APPEARCSF", "RBCCOUNTCSF", "WBCCSF", "POLYSCSF", "LYMPHSCSF", "EOSCSF", "PROTEINCSF", "GLUCCSF", "JCVIRUS", "CSFOLI", "IGGCSF", "LABACHR", "ACETBL"   Inflammation (CRP: Acute  ESR: Chronic) No results found for: "CRP", "ESRSEDRATE", "LATICACIDVEN"   Rheumatology No results found for: "RF", "ANA", "LABURIC", "URICUR", "LYMEIGGIGMAB", "LYMEABIGMQN", "HLAB27"   Coagulation Lab Results  Component Value Date   PLT 212 11/16/2022     Cardiovascular Lab Results  Component Value Date  BNP 35.6 01/01/2022   TROPONINI <0.03 09/06/2018   HGB 12.3 11/16/2022   HCT 39.2 11/16/2022     Screening Lab Results  Component Value Date   SARSCOV2NAA POSITIVE (A) 10/19/2020     Cancer No results found for: "CEA", "CA125", "LABCA2"   Allergens No results found for: "ALMOND", "APPLE", "ASPARAGUS", "AVOCADO", "BANANA", "BARLEY", "BASIL", "BAYLEAF", "GREENBEAN", "LIMABEAN", "WHITEBEAN", "BEEFIGE", "REDBEET", "BLUEBERRY", "BROCCOLI", "CABBAGE", "MELON",  "CARROT", "CASEIN", "CASHEWNUT", "CAULIFLOWER", "CELERY"     Note: Lab results reviewed.  Recent Diagnostic Imaging Review  Cervical Imaging: Cervical MR wo contrast: No results found for this or any previous visit.  Cervical MR wo contrast: No valid procedures specified. Cervical CT wo contrast: Results for orders placed during the hospital encounter of 10/02/20  CT Cervical Spine Wo Contrast  Narrative CLINICAL DATA:  Fall with head and neck pain.  EXAM: CT HEAD WITHOUT CONTRAST  CT CERVICAL SPINE WITHOUT CONTRAST  TECHNIQUE: Multidetector CT imaging of the head and cervical spine was performed following the standard protocol without intravenous contrast. Multiplanar CT image reconstructions of the cervical spine were also generated.  COMPARISON:  CT head and cervical spine dated 02/05/2018.  FINDINGS: CT HEAD FINDINGS  Brain: No evidence of acute infarction, hemorrhage, hydrocephalus, extra-axial collection or mass lesion/mass effect.  Vascular: There are vascular calcifications in the carotid siphons.  Skull: Normal. Negative for fracture or focal lesion.  Sinuses/Orbits: No acute finding.  Other: Soft tissue swelling/hematoma of the right forehead and right periorbital region.  CT CERVICAL SPINE FINDINGS  Alignment: Normal.  Skull base and vertebrae: No acute fracture. No primary bone lesion or focal pathologic process.  Soft tissues and spinal canal: No prevertebral fluid or swelling. No visible canal hematoma.  Disc levels: Up to moderate to severe multilevel degenerative disc and joint disease.  Upper chest: Negative.  Other: A hypoechoic right thyroid nodule measures 2.5 cm.  IMPRESSION: 1. No acute intracranial process. 2. No acute osseous injury in the cervical spine. 3. Hypoechoic right thyroid nodule measuring 2.5 cm. Recommend non emergent thyroid US (ref: J Am Coll Radiol. 2015 Feb;12(2): 143-50).   Electronically Signed By: Romona Curls M.D. On: 10/02/2020 13:50   Narrative CLINICAL DATA:  Chronic right shoulder pain.  EXAM: RIGHT SHOULDER - 2+ VIEW  COMPARISON:  None Available.  FINDINGS: The bones appear mildly demineralized. No evidence of acute fracture or dislocation. There are mild acromioclavicular and glenohumeral degenerative changes. There is probable subacromial spurring and narrowing of the subacromial space. No soft tissue abnormalities are identified.  IMPRESSION: Mild degenerative changes with probable subacromial spurring and narrowing of the subacromial space, suggesting chronic rotator cuff impingement. No acute osseous findings.   Electronically Signed By: Carey Bullocks M.D. On: 01/06/2023 16:27    Lumbar DG Bending views: Results for orders placed during the hospital encounter of 01/01/23  DG Lumbar Spine Complete W/Bend  Narrative CLINICAL DATA:  Chronic low back pain without sciatica.  EXAM: LUMBAR SPINE - COMPLETE WITH BENDING VIEWS  COMPARISON:  Lumbar spine radiographs 04/07/2015. Abdominopelvic CT 11/21/2021.  FINDINGS: The bones are demineralized. There are 5 lumbar type vertebral bodies. The alignment is stable with a mild convex right thoracolumbar scoliosis and a degenerative grade 1 anterolisthesis at L4-5. There is no evidence of acute fracture or pars defect. Similar multilevel spondylosis with disc space narrowing, endplate osteophytes and facet hypertrophy throughout the visualized thoracolumbar spine. Aortoiliac atherosclerosis noted.  IMPRESSION: 1. Stable multilevel spondylosis and alignment. No acute osseous findings. 2. Aortoiliac atherosclerosis.  Electronically Signed By: Carey Bullocks M.D. On: 01/06/2023 16:31         Narrative CLINICAL DATA:  88 year old female with right hip pain for 4 days. Pain with walking.  EXAM: DG HIP (WITH OR WITHOUT PELVIS) 2-3V RIGHT  COMPARISON:  Right hip series 04/07/15.  FINDINGS: Femoral  heads remain normally located. Hip joint spaces appear stable and normal for age. The pelvis appears stable and intact. Sacral ala and SI joints appear stable and intact. Grossly intact proximal left femur. Intact proximal right femur.  IMPRESSION: No acute osseous abnormality identified about the right hip or pelvis.   Electronically Signed By: Odessa Fleming M.D. On: 11/22/2016 12:37   Narrative CLINICAL DATA:  Left hip pain.  EXAM: DG HIP (WITH OR WITHOUT PELVIS) 2-3V LEFT  COMPARISON:  None.  FINDINGS: There is no evidence of hip fracture or dislocation. Degenerative changes seen in the form of joint space narrowing and acetabular sclerosis.  IMPRESSION: No acute osseous abnormality.   Electronically Signed By: Aram Candela M.D. On: 03/25/2021 23:46   DG Knee Complete 4 Views Right  Narrative CLINICAL DATA:  Chronic right knee pain/arthralgia.  EXAM: RIGHT KNEE - COMPLETE 4+ VIEW  COMPARISON:  None Available.  FINDINGS: The bones appear mildly demineralized. No evidence of acute fracture or dislocation. There are tricompartmental degenerative changes, most advanced in the medial and patellofemoral compartments. There is meniscal chondrocalcinosis. Mild spurring at the quadriceps insertion on the patella. No evidence of significant joint effusion or other focal soft tissue abnormality.  IMPRESSION: Tricompartmental degenerative changes with meniscal chondrocalcinosis. No acute osseous findings.   Electronically Signed By: Carey Bullocks M.D. On: 01/06/2023 16:28   Narrative CLINICAL DATA:  Swelling. Pain for years, swelling started this morning.  EXAM: LEFT KNEE - COMPLETE 4+ VIEW  COMPARISON:  None.  FINDINGS: No fracture or dislocation. Moderate tricompartmental osteoarthritis with peripheral spurring. Mild medial tibiofemoral joint space narrowing. Possible small joint effusion. No erosion, bony destruction, or evidence of focal  lesion. Small quadriceps and patellar tendon enthesophytes. Faint chondrocalcinosis. Mild generalized soft tissue edema.  IMPRESSION: 1. Moderate tricompartmental osteoarthritis. Possible small joint effusion. 2. Faint chondrocalcinosis. 3. Generalized soft tissue edema.   Electronically Signed By: Narda Rutherford M.D. On: 09/29/2019 14:04    Complexity Note: Imaging results reviewed.                         Meds   Current Outpatient Medications:    albuterol (VENTOLIN HFA) 108 (90 Base) MCG/ACT inhaler, Inhale 2 puffs into the lungs every 4 (four) hours as needed for wheezing or shortness of breath., Disp: 18 g, Rfl: 6   ferrous sulfate 325 (65 FE) MG tablet, Take 325 mg by mouth once a week., Disp: , Rfl:    furosemide (LASIX) 20 MG tablet, Take 1 tablet (20 mg total) by mouth daily as needed., Disp: 90 tablet, Rfl: 3   gabapentin (NEURONTIN) 300 MG capsule, Take 1 capsule (300 mg total) by mouth at bedtime., Disp: 30 capsule, Rfl: 2   glimepiride (AMARYL) 2 MG tablet, TAKE 1 TABLET BY MOUTH TWICE DAILY, Disp: 180 tablet, Rfl: 3   JANUVIA 100 MG tablet, TAKE 1 TABLET BY MOUTH ONCE DAILY, Disp: 90 tablet, Rfl: 3   loratadine (CLARITIN) 10 MG tablet, Take 1 tablet (10 mg total) by mouth daily., Disp: 90 tablet, Rfl: 3   losartan-hydrochlorothiazide (HYZAAR) 50-12.5 MG tablet, Take 2 tablets by mouth daily., Disp: 180  tablet, Rfl: 0   metFORMIN (GLUCOPHAGE) 1000 MG tablet, TAKE 1 TABLET BY MOUTH TWICE DAILY, Disp: 180 tablet, Rfl: 3   Misc. Devices (WALKER) MISC, 1 each by Does not apply route daily., Disp: 1 each, Rfl: 0   omeprazole (PRILOSEC) 20 MG capsule, TAKE 1 CAPSULE BY MOUTH ONCE DAILY, Disp: 30 capsule, Rfl: 3   rosuvastatin (CRESTOR) 5 MG tablet, Take 1 tablet (5 mg total) by mouth daily., Disp: 90 tablet, Rfl: 3   Respiratory Therapy Supplies (NEBULIZER) DEVI, 1 Device by Does not apply route 2 (two) times daily as needed. (Patient not taking: Reported on 01/22/2023),  Disp: 1 each, Rfl: 0  ROS  Constitutional: Denies any fever or chills Gastrointestinal: No reported hemesis, hematochezia, vomiting, or acute GI distress Musculoskeletal: Denies any acute onset joint swelling, redness, loss of ROM, or weakness Neurological: No reported episodes of acute onset apraxia, aphasia, dysarthria, agnosia, amnesia, paralysis, loss of coordination, or loss of consciousness  Allergies  Ms. Chimenti has No Known Allergies.  PFSH  Drug: Ms. Schirtzinger  reports no history of drug use. Alcohol:  reports no history of alcohol use. Tobacco:  reports that she has quit smoking. She has never used smokeless tobacco. Medical:  has a past medical history of Asthma, Diabetes mellitus without complication (HCC), GERD (gastroesophageal reflux disease), Hypercholesteremia, and Hypertension. Surgical: Ms. Schiferl  has a past surgical history that includes Cesarean section and Colonoscopy with propofol (N/A, 03/19/2016). Family: family history includes Asthma in her child; Diabetes in her sister; Heart attack in her mother; Heart disease in her mother; Leukemia in her sister; Lung cancer in her father.  Constitutional Exam  General appearance: Well nourished, well developed, and well hydrated. In no apparent acute distress Vitals:   01/22/23 0934  BP: 130/78  Pulse: 96  Resp: 16  Temp: (!) 97.3 F (36.3 C)  TempSrc: Temporal  SpO2: 95%  Weight: 223 lb (101.2 kg)  Height: 5\' 2"  (1.575 m)   BMI Assessment: Estimated body mass index is 40.79 kg/m as calculated from the following:   Height as of this encounter: 5\' 2"  (1.575 m).   Weight as of this encounter: 223 lb (101.2 kg).  BMI interpretation table: BMI level Category Range association with higher incidence of chronic pain  <18 kg/m2 Underweight   18.5-24.9 kg/m2 Ideal body weight   25-29.9 kg/m2 Overweight Increased incidence by 20%  30-34.9 kg/m2 Obese (Class I) Increased incidence by 68%  35-39.9 kg/m2 Severe obesity (Class  II) Increased incidence by 136%  >40 kg/m2 Extreme obesity (Class III) Increased incidence by 254%   Patient's current BMI Ideal Body weight  Body mass index is 40.79 kg/m. Ideal body weight: 50.1 kg (110 lb 7.2 oz) Adjusted ideal body weight: 70.5 kg (155 lb 7.5 oz)   BMI Readings from Last 4 Encounters:  01/22/23 40.79 kg/m  01/15/23 40.79 kg/m  01/01/23 40.79 kg/m  12/18/22 42.40 kg/m   Wt Readings from Last 4 Encounters:  01/22/23 223 lb (101.2 kg)  01/15/23 223 lb (101.2 kg)  01/01/23 223 lb (101.2 kg)  12/18/22 224 lb 6.4 oz (101.8 kg)    Psych/Mental status: Alert, oriented x 3 (person, place, & time)       Eyes: PERLA Respiratory: No evidence of acute respiratory distress  Cervical Spine Area Exam  Skin & Axial Inspection: No masses, redness, edema, swelling, or associated skin lesions Alignment: Symmetrical Functional ROM: Unrestricted ROM      Stability: No instability detected Muscle Tone/Strength: Functionally  intact. No obvious neuro-muscular anomalies detected. Sensory (Neurological): Unimpaired Palpation: No palpable anomalies             Upper Extremity (UE) Exam      Side: Right upper extremity   Side: Left upper extremity  Skin & Extremity Inspection: Skin color, temperature, and hair growth are WNL. No peripheral edema or cyanosis. No masses, redness, swelling, asymmetry, or associated skin lesions. No contractures.   Skin & Extremity Inspection: Skin color, temperature, and hair growth are WNL. No peripheral edema or cyanosis. No masses, redness, swelling, asymmetry, or associated skin lesions. No contractures.  Functional ROM: Pain restricted ROM           Functional ROM: Unrestricted ROM          Muscle Tone/Strength: Functionally intact. No obvious neuro-muscular anomalies detected.   Muscle Tone/Strength: Functionally intact. No obvious neuro-muscular anomalies detected.  Sensory (Neurological): Arthropathic arthralgia           Sensory  (Neurological): Unimpaired          Palpation: No palpable anomalies               Palpation: No palpable anomalies              Provocative Test(s):  Phalen's test: deferred Tinel's test: deferred Apley's scratch test (touch opposite shoulder):  Action 1 (Across chest): Decreased ROM Action 2 (Overhead): Decreased ROM Action 3 (LB reach): Decreased ROM     Provocative Test(s):  Phalen's test: deferred Tinel's test: deferred Apley's scratch test (touch opposite shoulder):  Action 1 (Across chest): deferred Action 2 (Overhead): deferred Action 3 (LB reach): deferred      Lumbar Spine Area Exam  Skin & Axial Inspection: No masses, redness, or swelling Alignment: Symmetrical Functional ROM: Unrestricted ROM       Stability: No instability detected Muscle Tone/Strength: Functionally intact. No obvious neuro-muscular anomalies detected. Sensory (Neurological): Musculoskeletal pain pattern   Gait & Posture Assessment  Ambulation: Patient ambulates using a walker Gait: Antalgic gait (limping) Posture: Difficulty standing up straight, due to pain  Lower Extremity Exam      Side: Right lower extremity   Side: Left lower extremity  Stability: No instability observed           Stability: No instability observed          Skin & Extremity Inspection: Skin color, temperature, and hair growth are WNL. No peripheral edema or cyanosis. No masses, redness, swelling, asymmetry, or associated skin lesions. No contractures.   Skin & Extremity Inspection: Skin color, temperature, and hair growth are WNL. No peripheral edema or cyanosis. No masses, redness, swelling, asymmetry, or associated skin lesions. No contractures.  Functional ROM: Pain restricted ROM for hip joint           Functional ROM: Pain restricted ROM for hip joint          Muscle Tone/Strength: Functionally intact. No obvious neuro-muscular anomalies detected.   Muscle Tone/Strength: Functionally intact. No obvious neuro-muscular  anomalies detected.  Sensory (Neurological): Paresthesia (Burning sensation)         Sensory (Neurological): Paresthesia (Burning sensation)        DTR: Patellar: deferred today Achilles: deferred today Plantar: deferred today   DTR: Patellar: deferred today Achilles: deferred today Plantar: deferred today  Palpation: No palpable anomalies   Palpation: No palpable anomalies     Assessment & Plan  Primary Diagnosis & Pertinent Problem List: The primary encounter diagnosis was Lumbar spondylosis. Diagnoses of  Lumbar facet arthropathy, Primary osteoarthritis of right knee, Primary osteoarthritis of right shoulder, and Chronic pain syndrome were also pertinent to this visit.  Visit Diagnosis: 1. Lumbar spondylosis   2. Lumbar facet arthropathy   3. Primary osteoarthritis of right knee   4. Primary osteoarthritis of right shoulder   5. Chronic pain syndrome    Problems updated and reviewed during this visit: Problem  Lumbar Facet Arthropathy  Primary Osteoarthritis of Right Shoulder  Chronic Pain Syndrome  Primary Osteoarthritis of Right Knee    Plan of Care  General Recommendations: The pain condition that the patient suffers from is best treated with a multidisciplinary approach that involves an increase in physical activity to prevent de-conditioning and worsening of the pain cycle, as well as psychological counseling (formal and/or informal) to address the co-morbid psychological affects of pain. Treatment will often involve interventional procedures to decrease the pain, allowing the patient to participate in the physical activity that will ultimately produce long-lasting pain reductions. The goal of the multidisciplinary approach is to return the patient to a higher level of overall function and to restore their ability to perform activities of daily living.  1. Lumbar spondylosis - LUMBAR FACET(MEDIAL BRANCH NERVE BLOCK) MBNB; Future - Ambulatory referral to Physical  Therapy  2. Lumbar facet arthropathy - LUMBAR FACET(MEDIAL BRANCH NERVE BLOCK) MBNB; Future - Ambulatory referral to Physical Therapy  3. Primary osteoarthritis of right knee - KNEE INJECTION; Future - Ambulatory referral to Physical Therapy  4. Primary osteoarthritis of right shoulder - SHOULDER INJECTION; Future - Ambulatory referral to Physical Therapy  5. Chronic pain syndrome - KNEE INJECTION; Future - SHOULDER INJECTION; Future - LUMBAR FACET(MEDIAL BRANCH NERVE BLOCK) MBNB; Future - Ambulatory referral to Physical Therapy  Procedure Orders         KNEE INJECTION         SHOULDER INJECTION         LUMBAR FACET(MEDIAL BRANCH NERVE BLOCK) MBNB      Referral Orders         Ambulatory referral to Physical Therapy      Provider-requested follow-up: Return in about 15 days (around 02/06/2023) for R shoulder and right knee steroid injection. Recent Visits Date Type Provider Dept  01/01/23 Office Visit Vanessa Jolly, MD [facility]  Showing recent visits within past 90 days and meeting all other requirements Today's Visits Date Type Provider Dept  01/22/23 Office Visit Vanessa Jolly, MD [facility]  Showing today's visits and meeting all other requirements Future Appointments Date Type Provider Dept  02/06/23 Appointment Vanessa Jolly, MD [facility]  Showing future appointments within next 90 days and meeting all other requirements   Primary Care Physician: Vanessa Lat, PA-C  Duration of encounter: .  Total time on encounter, as per AMA guidelines included both the face-to-face and non-face-to-face time personally spent by the physician and/or other qualified health care professional(s) on the day of the encounter (includes time in activities that require the physician or other qualified health care professional and does not include time in activities normally performed by clinical staff). Physician's time may include the  following activities when performed: Preparing to see the patient (e.g., pre-charting review of records, searching for previously ordered imaging, lab work, and nerve conduction tests) Review of prior analgesic pharmacotherapies. Reviewing PMP Interpreting ordered tests (e.g., lab work, imaging, nerve conduction tests) Performing post-procedure evaluations, including interpretation of diagnostic procedures Obtaining and/or reviewing separately obtained history Performing a medically appropriate examination and/or evaluation  Counseling and educating the patient/family/caregiver Ordering medications, tests, or procedures Referring and communicating with other health care professionals (when not separately reported) Documenting clinical information in the electronic or other health record Independently interpreting results (not separately reported) and communicating results to the patient/ family/caregiver Care coordination (not separately reported)  Note by: Vanessa Jolly, MD (TTS technology used. I apologize for any typographical errors that were not detected and corrected.) Date: 01/22/2023; Time: 10:14 AM

## 2023-01-22 NOTE — Patient Instructions (Signed)
______________________________________________________________________    General Risks and Possible Complications  Patient Responsibilities: It is important that you read this as it is part of your informed consent. It is our duty to inform you of the risks and possible complications associated with treatments offered to you. It is your responsibility as a patient to read this and to ask questions about anything that is not clear or that you believe was not covered in this document.  Patient's Rights: You have the right to refuse treatment. You also have the right to change your mind, even after initially having agreed to have the treatment done. However, under this last option, if you wait until the last second to change your mind, you may be charged for the materials used up to that point.  Introduction: Medicine is not an Visual merchandiser. Everything in Medicine, including the lack of treatment(s), carries the potential for danger, harm, or loss (which is by definition: Risk). In Medicine, a complication is a secondary problem, condition, or disease that can aggravate an already existing one. All treatments carry the risk of possible complications. The fact that a side effects or complications occurs, does not imply that the treatment was conducted incorrectly. It must be clearly understood that these can happen even when everything is done following the highest safety standards.  No treatment: You can choose not to proceed with the proposed treatment alternative. The "PRO(s)" would include: avoiding the risk of complications associated with the therapy. The "CON(s)" would include: not getting any of the treatment benefits. These benefits fall under one of three categories: diagnostic; therapeutic; and/or palliative. Diagnostic benefits include: getting information which can ultimately lead to improvement of the disease or symptom(s). Therapeutic benefits are those associated with the successful  treatment of the disease. Finally, palliative benefits are those related to the decrease of the primary symptoms, without necessarily curing the condition (example: decreasing the pain from a flare-up of a chronic condition, such as incurable terminal cancer).  General Risks and Complications: These are associated to most interventional treatments. They can occur alone, or in combination. They fall under one of the following six (6) categories: no benefit or worsening of symptoms; bleeding; infection; nerve damage; allergic reactions; and/or death. No benefits or worsening of symptoms: In Medicine there are no guarantees, only probabilities. No healthcare provider can ever guarantee that a medical treatment will work, they can only state the probability that it may. Furthermore, there is always the possibility that the condition may worsen, either directly, or indirectly, as a consequence of the treatment. Bleeding: This is more common if the patient is taking a blood thinner, either prescription or over the counter (example: Goody Powders, Fish oil, Aspirin, Garlic, etc.), or if suffering a condition associated with impaired coagulation (example: Hemophilia, cirrhosis of the liver, low platelet counts, etc.). However, even if you do not have one on these, it can still happen. If you have any of these conditions, or take one of these drugs, make sure to notify your treating physician. Infection: This is more common in patients with a compromised immune system, either due to disease (example: diabetes, cancer, human immunodeficiency virus [HIV], etc.), or due to medications or treatments (example: therapies used to treat cancer and rheumatological diseases). However, even if you do not have one on these, it can still happen. If you have any of these conditions, or take one of these drugs, make sure to notify your treating physician. Nerve Damage: This is more common when the treatment is  an invasive one, but it  can also happen with the use of medications, such as those used in the treatment of cancer. The damage can occur to small secondary nerves, or to large primary ones, such as those in the spinal cord and brain. This damage may be temporary or permanent and it may lead to impairments that can range from temporary numbness to permanent paralysis and/or brain death. Allergic Reactions: Any time a substance or material comes in contact with our body, there is the possibility of an allergic reaction. These can range from a mild skin rash (contact dermatitis) to a severe systemic reaction (anaphylactic reaction), which can result in death. Death: In general, any medical intervention can result in death, most of the time due to an unforeseen complication. ______________________________________________________________________      ______________________________________________________________________    Preparing for your procedure  Appointments: If you think you may not be able to keep your appointment, call 24-48 hours in advance to cancel. We need time to make it available to others.  During your procedure appointment there will be: No Prescription Refills. No disability issues to discussed. No medication changes or discussions.  Instructions: Food intake: Avoid eating anything solid for at least 8 hours prior to your procedure. Clear liquid intake: You may take clear liquids such as water up to 2 hours prior to your procedure. (No carbonated drinks. No soda.) Transportation: Unless otherwise stated by your physician, bring a driver. (Driver cannot be a Market researcher, Pharmacist, community, or any other form of public transportation.) Morning Medicines: Except for blood thinners, take all of your other morning medications with a sip of water. Make sure to take your heart and blood pressure medicines. If your blood pressure's lower number is above 100, the case will be rescheduled. Blood thinners: Make sure to stop your blood  thinners as instructed.  If you take a blood thinner, but were not instructed to stop it, call our office [phone] and ask to talk to a nurse. Not stopping a blood thinner prior to certain procedures could lead to serious complications. Diabetics on insulin: Notify the staff so that you can be scheduled 1st case in the morning. If your diabetes requires high dose insulin, take only  of your normal insulin dose the morning of the procedure and notify the staff that you have done so. Preventing infections: Shower with an antibacterial soap the morning of your procedure.  Build-up your immune system: Take 1000 mg of Vitamin C with every meal (3 times a day) the day prior to your procedure. Antibiotics: Inform the nursing staff if you are taking any antibiotics or if you have any conditions that may require antibiotics prior to procedures. (Example: recent joint implants)   Pregnancy: If you are pregnant make sure to notify the nursing staff. Not doing so may result in injury to the fetus, including death.  Sickness: If you have a cold, fever, or any active infections, call and cancel or reschedule your procedure. Receiving steroids while having an infection may result in complications. Arrival: You must be in the facility at least 30 minutes prior to your scheduled procedure. Tardiness: Your scheduled time is also the cutoff time. If you do not arrive at least 15 minutes prior to your procedure, you will be rescheduled.  Children: Do not bring any children with you. Make arrangements to keep them home. Dress appropriately: There is always a possibility that your clothing may get soiled. Avoid long dresses. Valuables: Do not bring any jewelry  or valuables.  Reasons to call and reschedule or cancel your procedure: (Following these recommendations will minimize the risk of a serious complication.) Surgeries: Avoid having procedures within 2 weeks of any surgery. (Avoid for 2 weeks before or after any  surgery). Flu Shots: Avoid having procedures within 2 weeks of a flu shots or . (Avoid for 2 weeks before or after immunizations). Barium: Avoid having a procedure within 7-10 days after having had a radiological study involving the use of radiological contrast. (Myelograms, Barium swallow or enema study). Heart attacks: Avoid any elective procedures or surgeries for the initial 6 months after a "Myocardial Infarction" (Heart Attack). Blood thinners: It is imperative that you stop these medications before procedures. Let us know if you if you take any blood thinner.  Infection: Avoid procedures during or within two weeks of an infection (including chest colds or gastrointestinal problems). Symptoms associated with infections include: Localized redness, fever, chills, night sweats or profuse sweating, burning sensation when voiding, cough, congestion, stuffiness, runny nose, sore throat, diarrhea, nausea, vomiting, cold or Flu symptoms, recent or current infections. It is specially important if the infection is over the area that we intend to treat. Heart and lung problems: Symptoms that may suggest an active cardiopulmonary problem include: cough, chest pain, breathing difficulties or shortness of breath, dizziness, ankle swelling, uncontrolled high or unusually low blood pressure, and/or palpitations. If you are experiencing any of these symptoms, cancel your procedure and contact your primary care physician for an evaluation.  Remember:  Regular Business hours are:  Monday to Thursday 8:00 AM to 4:00 PM  Provider's Schedule: Delano Metz, MD:  Procedure days: Tuesday and Thursday 7:30 AM to 4:00 PM  Edward Jolly, MD:  Procedure days: Monday and Wednesday 7:30 AM to 4:00 PM Last  Updated: 01/15/2023 ______________________________________________________________________     Selective Nerve Root Block Patient Information  Description: Specific nerve roots exit the spinal canal and these  nerves can be compressed and inflamed by a bulging disc and bone spurs.  By injecting steroids on the nerve root, we can potentially decrease the inflammation surrounding these nerves, which often leads to decreased pain.  Also, by injecting local anesthesia on the nerve root, this can provide [place] helpful information to give to your referring doctor if it decreases your pain.  Selective nerve root blocks can be done along the spine from the neck to the low back depending on the location of your pain.   After numbing the skin with local anesthesia, a small needle is passed to the nerve root and the position of the needle is verified using x-ray pictures.  After the needle is in correct position, we then deposit the medication.  You may experience a pressure sensation while this is being done.  The entire block usually lasts less than 15 minutes.  Conditions that may be treated with selective nerve root blocks: Low back and leg pain Spinal stenosis Diagnostic block prior to potential surgery Neck and arm pain Post laminectomy syndrome  Preparation for the injection:  Do not eat any solid food or dairy products within 8 hours of your appointment. You may drink clear liquids up to 3 hours before an appointment.  Clear liquids include water, black coffee, juice or soda.  No milk or cream please. You may take your regular medications, including pain medications, with a sip of water before your appointment.  Diabetics should hold regular insulin (if taken separately) and take 1/2 normal NPH dose the morning of the  procedure.  Carry some sugar containing items with you to your appointment. A driver must accompany you and be prepared to drive you home after your procedure. Bring all your current medications with you. An IV may be inserted and sedation may be given at the discretion of the physician. A blood pressure cuff, EKG, and other monitors will often be applied during the procedure.  Some patients may  need to have extra oxygen administered for a short period. You will be asked to provide medical information, including allergies, prior to the procedure.  We must know immediately if you are taking blood  Thinners (like Coumadin) or if you are allergic to IV iodine contrast (dye).  Possible side-effects: All are usually temporary Bleeding from needle site Light headedness Numbness and tingling Decreased blood pressure Weakness in arms/legs Pressure sensation in back/neck Pain at injection site (several days)  Possible complications: All are extremely rare Infection Nerve injury Spinal headache (a headache wore with upright position)  Call if you experience: Fever/chills associated with headache or increased back/neck pain Headache worsened by an upright position New onset weakness or numbness of an extremity below the injection site Hives or difficulty breathing (go to the emergency room) Inflammation or drainage at the injection site(s) Severe back/neck pain greater than usual New symptoms which are concerning to you  Please note:  Although the local anesthetic injected can often make your back or neck feel good for several hours after the injection the pain will likely return.  It takes 3-5 days for steroids to work on the nerve root. You may not notice any pain relief for at least one week.  If effective, we will often do a series of 3 injections spaced 3-6 weeks apart to maximally decrease your pain.    If you have any questions, please call [phone] [facility]

## 2023-01-22 NOTE — Progress Notes (Signed)
Safety precautions to be maintained throughout the outpatient stay will include: orient to surroundings, keep bed in low position, maintain call bell within reach at all times, provide assistance with transfer out of bed and ambulation.  

## 2023-02-06 ENCOUNTER — Ambulatory Visit: Payer: 59 | Admitting: Student in an Organized Health Care Education/Training Program

## 2023-02-07 ENCOUNTER — Other Ambulatory Visit: Payer: Self-pay

## 2023-02-07 ENCOUNTER — Telehealth: Payer: Self-pay | Admitting: Physician Assistant

## 2023-02-07 MED ORDER — SITAGLIPTIN PHOSPHATE 100 MG PO TABS: 100.0000 mg | ORAL_TABLET | Freq: Every day | ORAL | 3 refills | Status: DC

## 2023-02-07 NOTE — Telephone Encounter (Signed)
refiiled 

## 2023-02-07 NOTE — Telephone Encounter (Signed)
[facility] faxed refill request for the following medications:   JANUVIA 100 MG tablet    Please advise.

## 2023-02-08 ENCOUNTER — Ambulatory Visit: Payer: 59 | Attending: Cardiology | Admitting: Cardiology

## 2023-02-08 ENCOUNTER — Telehealth: Payer: Self-pay | Admitting: Cardiology

## 2023-02-08 ENCOUNTER — Encounter: Payer: Self-pay | Admitting: Cardiology

## 2023-02-08 ENCOUNTER — Ambulatory Visit (INDEPENDENT_AMBULATORY_CARE_PROVIDER_SITE_OTHER): Payer: 59 | Admitting: Nurse Practitioner

## 2023-02-08 ENCOUNTER — Encounter: Payer: Self-pay | Admitting: Nurse Practitioner

## 2023-02-08 VITALS — BP 104/60 | HR 95 | Ht 61.0 in | Wt 218.6 lb

## 2023-02-08 VITALS — BP 120/70 | HR 81 | Temp 98.0°F | Ht 61.0 in | Wt 218.0 lb

## 2023-02-08 DIAGNOSIS — I10 Essential (primary) hypertension: Secondary | ICD-10-CM

## 2023-02-08 DIAGNOSIS — G479 Sleep disorder, unspecified: Secondary | ICD-10-CM

## 2023-02-08 DIAGNOSIS — Z6841 Body Mass Index (BMI) 40.0 and over, adult: Secondary | ICD-10-CM | POA: Diagnosis not present

## 2023-02-08 DIAGNOSIS — R0600 Dyspnea, unspecified: Secondary | ICD-10-CM

## 2023-02-08 DIAGNOSIS — R5383 Other fatigue: Secondary | ICD-10-CM

## 2023-02-08 DIAGNOSIS — R600 Localized edema: Secondary | ICD-10-CM

## 2023-02-08 DIAGNOSIS — G4719 Other hypersomnia: Secondary | ICD-10-CM | POA: Diagnosis not present

## 2023-02-08 DIAGNOSIS — E66813 Obesity, class 3: Secondary | ICD-10-CM

## 2023-02-08 MED ORDER — LOSARTAN POTASSIUM-HCTZ 50-12.5 MG PO TABS: 1.0000 | ORAL_TABLET | Freq: Every day | ORAL | 0 refills | Status: DC

## 2023-02-08 MED ORDER — SEMAGLUTIDE (1 MG/DOSE) 4 MG/3ML ~~LOC~~ SOPN: 1.0000 mg | PEN_INJECTOR | SUBCUTANEOUS | 0 refills | Status: DC

## 2023-02-08 MED ORDER — OZEMPIC (2 MG/DOSE) 8 MG/3ML ~~LOC~~ SOPN: PEN_INJECTOR | SUBCUTANEOUS | 3 refills | Status: DC

## 2023-02-08 NOTE — Assessment & Plan Note (Signed)
See above

## 2023-02-08 NOTE — Assessment & Plan Note (Signed)
She has excessive daytime sleepiness and restless sleep. BMI 41. History of HTN, DM. Given this,  I am concerned she could have sleep disordered breathing with obstructive sleep apnea. She will need sleep study for further evaluation.    - discussed how weight can impact sleep and risk for sleep disordered breathing - discussed options to assist with weight loss: combination of diet modification, cardiovascular and strength training exercises   - had an extensive discussion regarding the adverse health consequences related to untreated sleep disordered breathing - specifically discussed the risks for hypertension, coronary artery disease, cardiac dysrhythmias, cerebrovascular disease, and diabetes - lifestyle modification discussed   - discussed how sleep disruption can increase risk of accidents, particularly when driving - safe driving practices were discussed  Patient Instructions  Given your symptoms, I am concerned that you may have sleep disordered breathing with sleep apnea. You will need a sleep study for further evaluation. Someone will contact you to schedule this.   We discussed how untreated sleep apnea puts an individual at risk for cardiac arrhthymias, pulm HTN, DM, stroke and increases their risk for daytime accidents. We also briefly reviewed treatment options including weight loss, side sleeping position, oral appliance, CPAP therapy or referral to ENT for possible surgical options  Use caution when driving and pull over if you become sleepy.  Follow up in 6 weeks with Katie Kyle Luppino,NP to go over sleep study results, or sooner, if needed

## 2023-02-08 NOTE — Patient Instructions (Signed)
Given your symptoms, I am concerned that you may have sleep disordered breathing with sleep apnea. You will need a sleep study for further evaluation. Someone will contact you to schedule this.   We discussed how untreated sleep apnea puts an individual at risk for cardiac arrhthymias, pulm HTN, DM, stroke and increases their risk for daytime accidents. We also briefly reviewed treatment options including weight loss, side sleeping position, oral appliance, CPAP therapy or referral to ENT for possible surgical options  Use caution when driving and pull over if you become sleepy.  Follow up in 6 weeks with Vanessa Laveda Demedeiros,NP to go over sleep study results, or sooner, if needed

## 2023-02-08 NOTE — Telephone Encounter (Signed)
Calling to speak with triage in regards to confirmation of dosage for patient with medication Ozempic

## 2023-02-08 NOTE — Telephone Encounter (Signed)
Spoke to [place] at [facility] and clarified dosage of medication

## 2023-02-08 NOTE — Patient Instructions (Signed)
Medication Instructions:   1. DECREASE losartan-hydrochlorothiazide (HYZAAR) 50-12.5 MG tablet -- Take ONE tablet my mouth daily.   Start taking Ozempic:  Inject 0.25 MG into skin once a week, for 4 weeks.   Inject 0.50 MG into skin once a week, for 4 weeks. (Call or send [place] a MyChart message when you are on week 2, so we can send your next dose in for you).   Inject 1.0 MG into skin once a week, for 4 weeks   Inject 2.0 MG into skin once a week, and continue at this dose.     *If you need a refill on your cardiac medications before your next appointment, please call your pharmacy*   Lab Work:  None Ordered  If you have labs (blood work) drawn today and your tests are completely normal, you will receive your results only by: MyChart Message (if you have MyChart) OR A paper copy in the mail If you have any lab test that is abnormal or we need to change your treatment, we will call you to review the results.   Testing/Procedures:  None Ordered   Follow-Up: At [facility], you and your health needs are our priority.  As part of our continuing mission to provide you with exceptional heart care, we have created designated Provider Care Teams.  These Care Teams include your primary Cardiologist (physician) and Advanced Practice Providers (APPs -  Physician Assistants and Nurse Practitioners) who all work together to provide you with the care you need, when you need it.  We recommend signing up for the patient portal called "MyChart".  Sign up information is provided on this After Visit Summary.  MyChart is used to connect with patients for Virtual Visits (Telemedicine).  Patients are able to view lab/test results, encounter notes, upcoming appointments, etc.  Non-urgent messages can be sent to your provider as well.   To learn more about what you can do with MyChart, go to [email or web address].    Your next appointment:   2 month(s)  Provider:   Debbe Odea,  MD ONLY

## 2023-02-08 NOTE — Progress Notes (Signed)
Cardiology Office Note:    Date:  02/08/2023   ID:  Vanessa Vazquez, DOB Oct 23, 1934, MRN [id number]  PCP:  Debera Lat, PA-C   [facility] Providers Cardiologist:  Debbe Odea, MD     Referring MD: Debera Lat, PA-C   Chief Complaint  Patient presents with   Follow-up    Discuss cardiac testing results.  Patient denies new or acute cardiac problems/concerns today.      History of Present Illness:    Vanessa Vazquez is a 88 y.o. female with a hx of hypertension, hyperlipidemia, diabetes, GERD, obesity who presents for follow-up.  Previously seen due to fatigue and shortness of breath.  Patient also endorses poor sleeping habits.  Echo was obtained to evaluate any significant structural abnormalities.  Endorses leg edema, Lasix as needed was provided.  Atenolol previously stopped due to low normal BP.  Presents for echo results.  Patient still feels fatigued, no new concerns today.  Has appointment with sleep specialist today.  Past Medical History:  Diagnosis Date   Asthma    Diabetes mellitus without complication (HCC)    GERD (gastroesophageal reflux disease)    Hypercholesteremia    Hypertension     Past Surgical History:  Procedure Laterality Date   CESAREAN SECTION     COLONOSCOPY WITH PROPOFOL N/A 03/19/2016   Procedure: COLONOSCOPY WITH PROPOFOL;  Surgeon: Scot Jun, MD;  Location: [facility] ENDOSCOPY;  Service: Endoscopy;  Laterality: N/A;    Current Medications: Current Meds  Medication Sig   albuterol (VENTOLIN HFA) 108 (90 Base) MCG/ACT inhaler Inhale 2 puffs into the lungs every 4 (four) hours as needed for wheezing or shortness of breath.   ferrous sulfate 325 (65 FE) MG tablet Take 325 mg by mouth once a week.   furosemide (LASIX) 20 MG tablet Take 1 tablet (20 mg total) by mouth daily as needed.   gabapentin (NEURONTIN) 300 MG capsule Take 1 capsule (300 mg total) by mouth at bedtime.   glimepiride (AMARYL) 2 MG tablet TAKE 1 TABLET BY  MOUTH TWICE DAILY   loratadine (CLARITIN) 10 MG tablet Take 1 tablet (10 mg total) by mouth daily.   metFORMIN (GLUCOPHAGE) 1000 MG tablet TAKE 1 TABLET BY MOUTH TWICE DAILY   Misc. Devices (WALKER) MISC 1 each by Does not apply route daily.   omeprazole (PRILOSEC) 20 MG capsule TAKE 1 CAPSULE BY MOUTH ONCE DAILY   rosuvastatin (CRESTOR) 5 MG tablet Take 1 tablet (5 mg total) by mouth daily.   Semaglutide, 1 MG/DOSE, 4 MG/3ML SOPN Inject 1 mg into the skin once a week. For 4 weeks   Semaglutide, 2 MG/DOSE, (OZEMPIC, 2 MG/DOSE,) 8 MG/3ML SOPN Inject one pen into the skin once a week   sitaGLIPtin (JANUVIA) 100 MG tablet Take 1 tablet (100 mg total) by mouth daily.   [DISCONTINUED] losartan-hydrochlorothiazide (HYZAAR) 50-12.5 MG tablet Take 2 tablets by mouth daily.     Allergies:   Patient has no known allergies.   Social History   Socioeconomic History   Marital status: Widowed    Spouse name: Not on file   Number of children: Not on file   Years of education: Not on file   Highest education level: GED or equivalent  Occupational History   Not on file  Tobacco Use   Smoking status: Former   Smokeless tobacco: Never  Vaping Use   Vaping status: Never Used  Substance and Sexual Activity   Alcohol use: No  Alcohol/week: 0.0 standard drinks of alcohol   Drug use: No   Sexual activity: Never  Other Topics Concern   Not on file  Social History Narrative   Not on file   Social Determinants of Health   Financial Resource Strain: Low Risk  (01/15/2023)   Overall Financial Resource Strain (CARDIA)    Difficulty of Paying Living Expenses: Not hard at all  Food Insecurity: No Food Insecurity (01/15/2023)   Hunger Vital Sign    Worried About Running Out of Food in the Last Year: Never true    Ran Out of Food in the Last Year: Never true  Transportation Needs: No Transportation Needs (01/15/2023)   PRAPARE - Administrator, Civil Service (Medical): No    Lack of  Transportation (Non-Medical): No  Physical Activity: Inactive (01/15/2023)   Exercise Vital Sign    Days of Exercise per Week: 0 days    Minutes of Exercise per Session: 0 min  Stress: Stress Concern Present (01/15/2023)   [employer] of Occupational Health - Occupational Stress Questionnaire    Feeling of Stress : To some extent  Social Connections: Socially Isolated (01/15/2023)   Social Connection and Isolation Panel [NHANES]    Frequency of Communication with Friends and Family: Twice a week    Frequency of Social Gatherings with Friends and Family: Never    Attends Religious Services: More than 4 times per year    Active Member of [employer] or Organizations: No    Attends Banker Meetings: Never    Marital Status: Widowed     Family History: The patient's family history includes Asthma in her child; Diabetes in her sister; Heart attack in her mother; Heart disease in her mother; Leukemia in her sister; Lung cancer in her father.  ROS:   Please see the history of present illness.     All other systems reviewed and are negative.  EKGs/Labs/Other Studies Reviewed:    The following studies were reviewed today:       Recent Labs: 10/23/2022: ALT 20; TSH 0.727 11/16/2022: Hemoglobin 12.3; Platelets 212 12/21/2022: BUN 26; Creatinine, Ser 1.26; Potassium 4.7; Sodium 139  Recent Lipid Panel    Component Value Date/Time   CHOL 297 (H) 10/23/2022 1626   TRIG 224 (H) 10/23/2022 1626   HDL 59 10/23/2022 1626   CHOLHDL 5.0 (H) 10/23/2022 1626   CHOLHDL 3.5 01/16/2021 1530   LDLCALC 195 (H) 10/23/2022 1626   LDLCALC 103 (H) 01/16/2021 1530     Risk Assessment/Calculations:             Physical Exam:    VS:  BP 104/60 (BP Location: Left Arm, Patient Position: Sitting, Cuff Size: Large)   Pulse 95   Ht 5\' 1"  (1.549 m)   Wt 218 lb 9.6 oz (99.2 kg)   SpO2 93%   BMI 41.30 kg/m     Wt Readings from Last 3 Encounters:  02/08/23 218 lb 9.6 oz (99.2 kg)   01/22/23 223 lb (101.2 kg)  01/15/23 223 lb (101.2 kg)     GEN:  Well nourished, well developed in no acute distress HEENT: Normal NECK: No JVD; No carotid bruits CARDIAC: RRR, no murmurs, rubs, gallops RESPIRATORY:  Clear to auscultation without rales, wheezing or rhonchi  ABDOMEN: Soft, non-tender, non-distended MUSCULOSKELETAL:  1-2+ edema; No deformity  SKIN: Warm and dry NEUROLOGIC:  Alert and oriented x 3 PSYCHIATRIC:  Normal affect   ASSESSMENT:    1. Dyspnea, unspecified type  2. Bilateral leg edema   3. Primary hypertension   4. Morbid obesity (HCC)   5. Fatigue, unspecified type    PLAN:    In order of problems listed above:  Dyspnea, echo with EF 55 to 60%, impaired relaxation.  Deconditioning, possible OSA, morbid obesity contributing.   Bilateral leg edema, improved, continue Lasix 20 mg daily as needed. Hypertension, BP low normal, symptoms of fatigue.  Reduce Hyzaar to 1 tablet daily ( losartan 50 mg, HCTZ 12 mg daily).  Monitor BP and keep a log. Morbid obesity, diabetic.  Weight loss advised, start Ozempic. Generalized fatigue, somnolence, poor sleep habits, morbidly obese.  Previously referred to sleep specialist.  Reduce BP med as above.  Follow-up in 2 months.      Medication Adjustments/Labs and Tests Ordered: Current medicines are reviewed at length with the patient today.  Concerns regarding medicines are outlined above.  No orders of the defined types were placed in this encounter.  Meds ordered this encounter  Medications   losartan-hydrochlorothiazide (HYZAAR) 50-12.5 MG tablet    Sig: Take 1 tablet by mouth daily.    Dispense:  90 tablet    Refill:  0   Semaglutide, 1 MG/DOSE, 4 MG/3ML SOPN    Sig: Inject 1 mg into the skin once a week. For 4 weeks    Dispense:  3 mL    Refill:  0   Semaglutide, 2 MG/DOSE, (OZEMPIC, 2 MG/DOSE,) 8 MG/3ML SOPN    Sig: Inject one pen into the skin once a week    Dispense:  3 mL    Refill:  3     Patient Instructions  Medication Instructions:   1. DECREASE losartan-hydrochlorothiazide (HYZAAR) 50-12.5 MG tablet -- Take ONE tablet my mouth daily.   Start taking Ozempic:  Inject 0.25 MG into skin once a week, for 4 weeks.   Inject 0.50 MG into skin once a week, for 4 weeks. (Call or send [place] a MyChart message when you are on week 2, so we can send your next dose in for you).   Inject 1.0 MG into skin once a week, for 4 weeks   Inject 2.0 MG into skin once a week, and continue at this dose.     *If you need a refill on your cardiac medications before your next appointment, please call your pharmacy*   Lab Work:  None Ordered  If you have labs (blood work) drawn today and your tests are completely normal, you will receive your results only by: MyChart Message (if you have MyChart) OR A paper copy in the mail If you have any lab test that is abnormal or we need to change your treatment, we will call you to review the results.   Testing/Procedures:  None Ordered   Follow-Up: At [facility], you and your health needs are our priority.  As part of our continuing mission to provide you with exceptional heart care, we have created designated Provider Care Teams.  These Care Teams include your primary Cardiologist (physician) and Advanced Practice Providers (APPs -  Physician Assistants and Nurse Practitioners) who all work together to provide you with the care you need, when you need it.  We recommend signing up for the patient portal called "MyChart".  Sign up information is provided on this After Visit Summary.  MyChart is used to connect with patients for Virtual Visits (Telemedicine).  Patients are able to view lab/test results, encounter notes, upcoming appointments, etc.  Non-urgent messages can  be sent to your provider as well.   To learn more about what you can do with MyChart, go to [email or web address].    Your next appointment:   2  month(s)  Provider:   Debbe Odea, MD ONLY      Signed, Debbe Odea, MD  02/08/2023 10:51 AM    [facility]

## 2023-02-08 NOTE — Assessment & Plan Note (Signed)
BMI 41. Healthy weight loss encouraged

## 2023-02-08 NOTE — Progress Notes (Signed)
@Patient  ID: Vanessa Vazquez, female    DOB: Sep 17, 1934, 88 y.o.   MRN: [id number]  Chief Complaint  Patient presents with   Consult    Fatigue. No snoring. No gasping for air.     Referring provider: Debbe Odea, MD  HPI: 88 year old female, former smoker referred for sleep consult.  Past medical history significant for AVM of colon, hypertension, DM, neuropathy, obesity, IDA.   TEST/EVENTS:   02/08/2023: Today-sleep consult Patient presents today for sleep consult with her daughter.  She has had trouble with her sleep quality for many years now.  She wakes up throughout the night and cannot seem to go back to sleep.  She feels very tired during the day.  Wakes feeling poorly rested.  Doesn't nap during the day. Has never been told that she snores.  Denies any morning headaches, drowsy driving, sleep parasomnia/paralysis. She goes to bed around 10 PM.  Usually falls asleep in under an hour.  Wakes 1 or more times a night.  Usually gets out of the bed around 7:30 AM.  She is retired.  Weight has fluctuated over the last 2 years.  Never had a previous sleep study.  Does not wear supplemental oxygen. She has a high blood pressure and diabetes, well-controlled on medications.  No history of stroke.  She is a former smoker.  Does not drink any alcohol.  No excessive caffeine intake.  No sleep aids.  Lives by herself.  Has adult children.  Family history of heart disease and cancer.  Epworth 5  No Known Allergies  Immunization History  Administered Date(s) Administered   Influenza, Seasonal, Injecte, Preservative Fre 04/23/2006, 03/25/2007, 03/10/2009, 03/07/2010   Pneumococcal Polysaccharide-23 04/12/2003    Past Medical History:  Diagnosis Date   Asthma    Diabetes mellitus without complication (HCC)    GERD (gastroesophageal reflux disease)    Hypercholesteremia    Hypertension     Tobacco History: Social History   Tobacco Use  Smoking Status Former  Smokeless Tobacco  Never   Counseling given: Not Answered   Outpatient Medications Prior to Visit  Medication Sig Dispense Refill   albuterol (VENTOLIN HFA) 108 (90 Base) MCG/ACT inhaler Inhale 2 puffs into the lungs every 4 (four) hours as needed for wheezing or shortness of breath. 18 g 6   ferrous sulfate 325 (65 FE) MG tablet Take 325 mg by mouth once a week.     furosemide (LASIX) 20 MG tablet Take 1 tablet (20 mg total) by mouth daily as needed. 90 tablet 3   gabapentin (NEURONTIN) 300 MG capsule Take 1 capsule (300 mg total) by mouth at bedtime. 30 capsule 2   glimepiride (AMARYL) 2 MG tablet TAKE 1 TABLET BY MOUTH TWICE DAILY 180 tablet 3   loratadine (CLARITIN) 10 MG tablet Take 1 tablet (10 mg total) by mouth daily. 90 tablet 3   losartan-hydrochlorothiazide (HYZAAR) 50-12.5 MG tablet Take 1 tablet by mouth daily. 90 tablet 0   metFORMIN (GLUCOPHAGE) 1000 MG tablet TAKE 1 TABLET BY MOUTH TWICE DAILY 180 tablet 3   Misc. Devices (WALKER) MISC 1 each by Does not apply route daily. 1 each 0   omeprazole (PRILOSEC) 20 MG capsule TAKE 1 CAPSULE BY MOUTH ONCE DAILY 30 capsule 3   Respiratory Therapy Supplies (NEBULIZER) DEVI 1 Device by Does not apply route 2 (two) times daily as needed. 1 each 0   rosuvastatin (CRESTOR) 5 MG tablet Take 1 tablet (5 mg total) by mouth daily.  90 tablet 3   Semaglutide, 1 MG/DOSE, 4 MG/3ML SOPN Inject 1 mg into the skin once a week. For 4 weeks 3 mL 0   Semaglutide, 2 MG/DOSE, (OZEMPIC, 2 MG/DOSE,) 8 MG/3ML SOPN Inject one pen into the skin once a week 3 mL 3   sitaGLIPtin (JANUVIA) 100 MG tablet Take 1 tablet (100 mg total) by mouth daily. 90 tablet 3   No facility-administered medications prior to visit.     Review of Systems:   Constitutional: No night sweats, fevers, chills, or lassitude. +weight change, fatigue  HEENT: No headaches, difficulty swallowing, tooth/dental problems, or sore throat. No sneezing, itching, ear ache, nasal congestion, or post nasal  drip CV:  No chest pain, orthopnea, PND, swelling in lower extremities, anasarca, dizziness, palpitations, syncope Resp: +baseline shortness of breath with exertion. No excess mucus or change in color of mucus. No productive or non-productive. No hemoptysis. No wheezing.  No chest wall deformity GI:  No heartburn, indigestion GU: No dysuria, change in color of urine, urgency or frequency.   Skin: No rash, lesions, ulcerations MSK:  No joint pain or swelling.   Neuro: No dizziness or lightheadedness.  Psych: No depression. +anxiety (stable). Mood stable. +sleep disturbance     Physical Exam:  BP 120/70 (BP Location: Left Arm, Cuff Size: Large)   Pulse 81   Temp 98 F (36.7 C)   Ht 5\' 1"  (1.549 m)   Wt 218 lb (98.9 kg) Comment: per patient. in a wheelchair today  SpO2 95%   BMI 41.19 kg/m   GEN: Pleasant, interactive, well-kempt; obese; in no acute distress. HEENT:  Normocephalic and atraumatic. PERRLA. Sclera white. Nasal turbinates pink, moist and patent bilaterally. No rhinorrhea present. Oropharynx pink and moist, without exudate or edema. No lesions, ulcerations, or postnasal drip. Mallampati III NECK:  Supple w/ fair ROM. No JVD present. Normal carotid impulses w/o bruits. Thyroid symmetrical with no goiter or nodules palpated. No lymphadenopathy.   CV: RRR, no m/r/g, no peripheral edema. Pulses intact, +2 bilaterally. No cyanosis, pallor or clubbing. PULMONARY:  Unlabored, regular breathing. Clear bilaterally A&P w/o wheezes/rales/rhonchi. No accessory muscle use.  GI: BS present and normoactive. Soft, non-tender to palpation. No organomegaly or masses detected.  MSK: No erythema, warmth or tenderness. Cap refil <2 sec all extrem. No deformities or joint swelling noted.  Neuro: A/Ox3. No focal deficits noted.   Skin: Warm, no lesions or rashe Psych: Normal affect and behavior. Judgement and thought content appropriate.     Lab Results:  CBC    Component Value Date/Time    WBC 4.5 11/16/2022 1116   RBC 4.04 11/16/2022 1116   HGB 12.3 11/16/2022 1116   HGB 11.9 10/23/2022 1626   HCT 39.2 11/16/2022 1116   HCT 34.9 10/23/2022 1626   PLT 212 11/16/2022 1116   PLT 223 10/23/2022 1626   MCV 97.0 11/16/2022 1116   MCV 94 10/23/2022 1626   MCH 30.4 11/16/2022 1116   MCHC 31.4 11/16/2022 1116   RDW 12.6 11/16/2022 1116   RDW 12.1 10/23/2022 1626   LYMPHSABS 2.7 10/23/2022 1626   MONOABS 0.4 05/19/2022 0722   EOSABS 0.2 10/23/2022 1626   BASOSABS 0.1 10/23/2022 1626    BMET    Component Value Date/Time   NA 139 12/21/2022 1038   K 4.7 12/21/2022 1038   CL 102 12/21/2022 1038   CO2 23 12/21/2022 1038   GLUCOSE 241 (H) 12/21/2022 1038   GLUCOSE 192 (H) 11/16/2022 1116   BUN  26 12/21/2022 1038   CREATININE 1.26 (H) 12/21/2022 1038   CREATININE 0.94 04/11/2022 1042   CALCIUM 10.2 12/21/2022 1038   GFRNONAA 50 (L) 11/16/2022 1116   GFRNONAA 62 11/23/2019 1059   GFRAA 72 11/23/2019 1059    BNP    Component Value Date/Time   BNP 35.6 01/01/2022 1357     Imaging:  No results found.  Administration History     None           No data to display          No results found for: "NITRICOXIDE"      Assessment & Plan:   Excessive daytime sleepiness She has excessive daytime sleepiness and restless sleep. BMI 41. History of HTN, DM. Given this,  I am concerned she could have sleep disordered breathing with obstructive sleep apnea. She will need sleep study for further evaluation.    - discussed how weight can impact sleep and risk for sleep disordered breathing - discussed options to assist with weight loss: combination of diet modification, cardiovascular and strength training exercises   - had an extensive discussion regarding the adverse health consequences related to untreated sleep disordered breathing - specifically discussed the risks for hypertension, coronary artery disease, cardiac dysrhythmias, cerebrovascular disease,  and diabetes - lifestyle modification discussed   - discussed how sleep disruption can increase risk of accidents, particularly when driving - safe driving practices were discussed  Patient Instructions  Given your symptoms, I am concerned that you may have sleep disordered breathing with sleep apnea. You will need a sleep study for further evaluation. Someone will contact you to schedule this.   We discussed how untreated sleep apnea puts an individual at risk for cardiac arrhthymias, pulm HTN, DM, stroke and increases their risk for daytime accidents. We also briefly reviewed treatment options including weight loss, side sleeping position, oral appliance, CPAP therapy or referral to ENT for possible surgical options  Use caution when driving and pull over if you become sleepy.  Follow up in 6 weeks with Katie Grayson Pfefferle,NP to go over sleep study results, or sooner, if needed     Restless sleeper See above  Class 3 severe obesity with body mass index (BMI) of 40.0 to 44.9 in adult (HCC) BMI 41. Healthy weight loss encouraged.    I spent 35 minutes of dedicated to the care of this patient on the date of this encounter to include pre-visit review of records, face-to-face time with the patient discussing conditions above, post visit ordering of testing, clinical documentation with the electronic health record, making appropriate referrals as documented, and communicating necessary findings to members of the patients care team.  Noemi Chapel, NP 02/08/2023  Pt aware and understands NP's role.

## 2023-03-05 ENCOUNTER — Other Ambulatory Visit: Payer: Self-pay

## 2023-03-05 MED ORDER — SEMAGLUTIDE(0.25 OR 0.5MG/DOS) 2 MG/3ML ~~LOC~~ SOPN: 0.2500 mg | PEN_INJECTOR | SUBCUTANEOUS | Status: DC

## 2023-03-05 NOTE — Addendum Note (Signed)
Addended by: Margrett Rud on: 03/05/2023 08:16 AM   Modules accepted: Orders

## 2023-03-06 ENCOUNTER — Telehealth: Payer: Self-pay

## 2023-03-06 MED ORDER — SEMAGLUTIDE(0.25 OR 0.5MG/DOS) 2 MG/3ML ~~LOC~~ SOPN: 2.0000 mg | PEN_INJECTOR | SUBCUTANEOUS | 0 refills | Status: DC

## 2023-03-06 NOTE — Telephone Encounter (Signed)
Placed order for sample that was given 02/08/2023

## 2023-03-20 ENCOUNTER — Encounter: Payer: Self-pay | Admitting: Physician Assistant

## 2023-03-20 ENCOUNTER — Telehealth: Payer: Self-pay | Admitting: Physician Assistant

## 2023-03-20 ENCOUNTER — Ambulatory Visit (INDEPENDENT_AMBULATORY_CARE_PROVIDER_SITE_OTHER): Payer: 59 | Admitting: Physician Assistant

## 2023-03-20 VITALS — BP 138/80 | HR 86 | Ht 61.0 in | Wt 219.0 lb

## 2023-03-20 DIAGNOSIS — D508 Other iron deficiency anemias: Secondary | ICD-10-CM | POA: Diagnosis not present

## 2023-03-20 DIAGNOSIS — Z7984 Long term (current) use of oral hypoglycemic drugs: Secondary | ICD-10-CM | POA: Diagnosis not present

## 2023-03-20 DIAGNOSIS — R0989 Other specified symptoms and signs involving the circulatory and respiratory systems: Secondary | ICD-10-CM

## 2023-03-20 DIAGNOSIS — R5381 Other malaise: Secondary | ICD-10-CM | POA: Diagnosis not present

## 2023-03-20 DIAGNOSIS — R5382 Chronic fatigue, unspecified: Secondary | ICD-10-CM | POA: Diagnosis not present

## 2023-03-20 DIAGNOSIS — R54 Age-related physical debility: Secondary | ICD-10-CM

## 2023-03-20 DIAGNOSIS — E1159 Type 2 diabetes mellitus with other circulatory complications: Secondary | ICD-10-CM | POA: Diagnosis not present

## 2023-03-20 DIAGNOSIS — Z23 Encounter for immunization: Secondary | ICD-10-CM | POA: Diagnosis not present

## 2023-03-20 DIAGNOSIS — N1831 Chronic kidney disease, stage 3a: Secondary | ICD-10-CM

## 2023-03-20 DIAGNOSIS — R0683 Snoring: Secondary | ICD-10-CM

## 2023-03-20 DIAGNOSIS — M7989 Other specified soft tissue disorders: Secondary | ICD-10-CM | POA: Diagnosis not present

## 2023-03-20 DIAGNOSIS — I152 Hypertension secondary to endocrine disorders: Secondary | ICD-10-CM

## 2023-03-20 MED ORDER — LORATADINE 10 MG PO TABS: 10.0000 mg | ORAL_TABLET | Freq: Every day | ORAL | 3 refills | Status: AC

## 2023-03-20 NOTE — Progress Notes (Signed)
Established patient visit  Patient: Vanessa Vazquez   DOB: 06/22/1934   88 y.o. Female  MRN: [id number] Visit Date: 03/20/2023  Today's healthcare provider: Debera Lat, PA-C   No chief complaint on file.  Subjective     Discussed the use of AI scribe software for clinical note transcription with the patient, who gave verbal consent to proceed.  History of Present Illness   The patient, with a history of hypertension, sleep apnea, iron deficiency, fluid accumulation, and diabetes, presents for a routine follow-up. She reports adherence to her prescribed medications, including those for hypertension and diabetes. She has seen multiple specialists, including a cardiologist, podiatrist, and pain management specialist.  The patient was referred for a sleep study due to suspected sleep apnea, but declined to proceed due to a required copay. She reports no current issues with sleep and does not use a sleep apnea machine. She has not noticed any gasping for air during sleep.  She reports no new symptoms related to this condition. She has been managing fluid accumulation by elevating her legs and wearing compression socks. She takes Lasix as needed, not daily.  The patient's diabetes is managed with metformin and Amaryl. She was previously on semaglutide injections (Ozempic), but discontinued due to adverse effects, even at the smallest dose.  The patient also reports a need for a refill of her Claritin prescription. She denies any current symptoms of dizziness, headache, blurry or double vision.           01/15/2023    1:10 PM 01/01/2023    9:33 AM 12/18/2022   10:59 AM  Depression screen PHQ 2/9  Decreased Interest 0 0 0  Down, Depressed, Hopeless 0 0 0  PHQ - 2 Score 0 0 0       No data to display          Medications: Outpatient Medications Prior to Visit  Medication Sig   albuterol (VENTOLIN HFA) 108 (90 Base) MCG/ACT inhaler Inhale 2 puffs into the lungs every 4 (four) hours  as needed for wheezing or shortness of breath.   ferrous sulfate 325 (65 FE) MG tablet Take 325 mg by mouth once a week.   gabapentin (NEURONTIN) 300 MG capsule Take 1 capsule (300 mg total) by mouth at bedtime.   glimepiride (AMARYL) 2 MG tablet TAKE 1 TABLET BY MOUTH TWICE DAILY   metFORMIN (GLUCOPHAGE) 1000 MG tablet TAKE 1 TABLET BY MOUTH TWICE DAILY   Misc. Devices (WALKER) MISC 1 each by Does not apply route daily.   omeprazole (PRILOSEC) 20 MG capsule TAKE 1 CAPSULE BY MOUTH ONCE DAILY   Respiratory Therapy Supplies (NEBULIZER) DEVI 1 Device by Does not apply route 2 (two) times daily as needed.   rosuvastatin (CRESTOR) 5 MG tablet Take 1 tablet (5 mg total) by mouth daily.   Semaglutide, 1 MG/DOSE, 4 MG/3ML SOPN Inject 1 mg into the skin once a week. For 4 weeks   sitaGLIPtin (JANUVIA) 100 MG tablet Take 1 tablet (100 mg total) by mouth daily.   [DISCONTINUED] loratadine (CLARITIN) 10 MG tablet Take 1 tablet (10 mg total) by mouth daily.   [DISCONTINUED] losartan-hydrochlorothiazide (HYZAAR) 50-12.5 MG tablet Take 1 tablet by mouth daily.   furosemide (LASIX) 20 MG tablet Take 1 tablet (20 mg total) by mouth daily as needed.   Semaglutide, 2 MG/DOSE, (OZEMPIC, 2 MG/DOSE,) 8 MG/3ML SOPN Inject one pen into the skin once a week (Patient not taking: Reported on 03/20/2023)   Semaglutide,0.25 or  0.5MG /DOS, 2 MG/3ML SOPN Inject 2 mg into the skin once a week for 28 days. (Patient not taking: Reported on 03/20/2023)   No facility-administered medications prior to visit.    Review of Systems  All other systems reviewed and are negative.  Except see HPI       Objective    BP 138/80   Pulse 86   Ht 5\' 1"  (1.549 m)   Wt 219 lb (99.3 kg)   SpO2 95%   BMI 41.38 kg/m     Physical Exam Vitals reviewed.  Constitutional:      General: She is not in acute distress.    Appearance: Normal appearance. She is well-developed. She is not diaphoretic.  HENT:     Head: Normocephalic and  atraumatic.  Eyes:     General: No scleral icterus.    Conjunctiva/sclera: Conjunctivae normal.  Neck:     Thyroid: No thyromegaly.  Cardiovascular:     Rate and Rhythm: Normal rate and regular rhythm.     Pulses: Normal pulses.     Heart sounds: Normal heart sounds. No murmur heard. Pulmonary:     Effort: Pulmonary effort is normal. No respiratory distress.     Breath sounds: Normal breath sounds. No wheezing, rhonchi or rales.  Musculoskeletal:     Cervical back: Neck supple.     Right lower leg: Edema present.     Left lower leg: Edema present.  Lymphadenopathy:     Cervical: No cervical adenopathy.  Skin:    General: Skin is warm and dry.     Findings: No rash.  Neurological:     Mental Status: She is alert and oriented to person, place, and time. Mental status is at baseline.  Psychiatric:        Mood and Affect: Mood normal.        Behavior: Behavior normal.      Results for orders placed or performed in visit on 03/20/23  Hemoglobin A1c  Result Value Ref Range   Hgb A1c MFr Bld 5.8 (H) 4.8 - 5.6 %   Est. average glucose Bld gHb Est-mCnc 120 mg/dL  Lipid panel  Result Value Ref Range   Cholesterol, Total 183 100 - 199 mg/dL   Triglycerides 244 0 - 149 mg/dL   HDL 64 >01 mg/dL   VLDL Cholesterol Cal 24 5 - 40 mg/dL   LDL Chol Calc (NIH) 95 0 - 99 mg/dL   Chol/HDL Ratio 2.9 0.0 - 4.4 ratio  TSH  Result Value Ref Range   TSH 0.608 0.450 - 4.500 uIU/mL  CBC with Differential/Platelet  Result Value Ref Range   WBC 5.6 3.4 - 10.8 x10E3/uL   RBC 3.70 (L) 3.77 - 5.28 x10E6/uL   Hemoglobin 11.7 11.1 - 15.9 g/dL   Hematocrit 02.7 25.3 - 46.6 %   MCV 99 (H) 79 - 97 fL   MCH 31.6 26.6 - 33.0 pg   MCHC 32.1 31.5 - 35.7 g/dL   RDW 66.4 (L) 40.3 - 47.4 %   Platelets 271 150 - 450 x10E3/uL   Neutrophils 55 Not Estab. %   Lymphs 33 Not Estab. %   Monocytes 9 Not Estab. %   Eos 2 Not Estab. %   Basos 1 Not Estab. %   Neutrophils Absolute 3.1 1.4 - 7.0 x10E3/uL    Lymphocytes Absolute 1.8 0.7 - 3.1 x10E3/uL   Monocytes Absolute 0.5 0.1 - 0.9 x10E3/uL   EOS (ABSOLUTE) 0.1 0.0 - 0.4 x10E3/uL  Basophils Absolute 0.1 0.0 - 0.2 x10E3/uL   Immature Granulocytes 0 Not Estab. %   Immature Grans (Abs) 0.0 0.0 - 0.1 x10E3/uL  Comprehensive metabolic panel  Result Value Ref Range   Glucose 91 70 - 99 mg/dL   BUN 23 8 - 27 mg/dL   Creatinine, Ser 8.29 0.57 - 1.00 mg/dL   eGFR 60 >56 OZ/HYQ/6.57   BUN/Creatinine Ratio 25 12 - 28   Sodium 143 134 - 144 mmol/L   Potassium 4.3 3.5 - 5.2 mmol/L   Chloride 105 96 - 106 mmol/L   CO2 24 20 - 29 mmol/L   Calcium 10.2 8.7 - 10.3 mg/dL   Total Protein 7.0 6.0 - 8.5 g/dL   Albumin 4.3 3.7 - 4.7 g/dL   Globulin, Total 2.7 1.5 - 4.5 g/dL   Bilirubin Total 0.4 0.0 - 1.2 mg/dL   Alkaline Phosphatase 95 44 - 121 IU/L   AST 20 0 - 40 IU/L   ALT 15 0 - 32 IU/L    Assessment & Plan    Hypertension associated with diabetes (HCC) Chronic Well controlled on current medication regimen. -Continue current antihypertensive medications. - loratadine (CLARITIN) 10 MG tablet; Take 1 tablet (10 mg total) by mouth daily.  Dispense: 90 tablet; Refill: 3 - Hemoglobin A1c - Lipid panel - TSH - CBC with Differential/Platelet - Comprehensive metabolic panel - Pro b natriuretic peptide (BNP)[facility]) - Troponin T  Hyperlipidemia Patient is on Crestor. -Continue Crestor.  Diabetes Patient is on Metformin and Amaryl. -Continue current medications.  Chronic fatigue and malaise Iron deficiency anemia secondary to inadequate dietary iron intake Status unclear, requires further investigation. -Order blood work to assess iron levels.  Stage 3a chronic kidney disease (HCC) -Repeat blood work today to assess kidney function  Morbid obesity (HCC) Chronic and stable Weight loss of 5% of pt's current weight via healthy diet and daily exercise encouraged. Ordered labs, see above Will  follow-up  Snoring Patient declined sleep study and does not have a CPAP machine. No current symptoms of sleep apnea reported. -Monitor for symptoms of sleep apnea such as gasping for air during sleep. If symptoms develop, reconsider sleep study.  Bibasilar crackles Leg swelling Fluid Accumulation Patient reports no worsening of symptoms. Currently on Lasix as needed. -Advise patient to take Lasix daily due to auscultatory findings suggestive of fluid accumulation. -Order blood work to assess kidney function. - Pro b natriuretic peptide (BNP)[facility]) - Troponin T  Encounter for immunization - Flu Vaccine Trivalent High Dose (Fluad)     Return in about 3 months (around 06/20/2023) for chronic disease f/u.     The patient was advised to call back or seek an in-person evaluation if the symptoms worsen or if the condition fails to improve as anticipated.  I discussed the assessment and treatment plan with the patient. The patient was provided an opportunity to ask questions and all were answered. The patient agreed with the plan and demonstrated an understanding of the instructions.  I, Debera Lat, PA-C have reviewed all documentation for this visit. The documentation on  03/20/23 for the exam, diagnosis, procedures, and orders are all accurate and complete.  Debera Lat, [facility], MMS [facility] [phone] (phone) [phone] (fax)  [facility]

## 2023-03-20 NOTE — Telephone Encounter (Signed)
[facility] requesting PA Key: [id number] Name: Hovorka

## 2023-03-21 LAB — CBC WITH DIFFERENTIAL/PLATELET
Basophils Absolute: 0.1 10*3/uL (ref 0.0–0.2)
Basos: 1 %
EOS (ABSOLUTE): 0.1 10*3/uL (ref 0.0–0.4)
Eos: 2 %
Hematocrit: 36.5 % (ref 34.0–46.6)
Hemoglobin: 11.7 g/dL (ref 11.1–15.9)
Immature Grans (Abs): 0 10*3/uL (ref 0.0–0.1)
Immature Granulocytes: 0 %
Lymphocytes Absolute: 1.8 10*3/uL (ref 0.7–3.1)
Lymphs: 33 %
MCH: 31.6 pg (ref 26.6–33.0)
MCHC: 32.1 g/dL (ref 31.5–35.7)
MCV: 99 fL — ABNORMAL HIGH (ref 79–97)
Monocytes Absolute: 0.5 10*3/uL (ref 0.1–0.9)
Monocytes: 9 %
Neutrophils Absolute: 3.1 10*3/uL (ref 1.4–7.0)
Neutrophils: 55 %
Platelets: 271 10*3/uL (ref 150–450)
RBC: 3.7 x10E6/uL — ABNORMAL LOW (ref 3.77–5.28)
RDW: 11.4 % — ABNORMAL LOW (ref 11.7–15.4)
WBC: 5.6 10*3/uL (ref 3.4–10.8)

## 2023-03-21 LAB — COMPREHENSIVE METABOLIC PANEL
ALT: 15 [IU]/L (ref 0–32)
AST: 20 [IU]/L (ref 0–40)
Albumin: 4.3 g/dL (ref 3.7–4.7)
Alkaline Phosphatase: 95 [IU]/L (ref 44–121)
BUN/Creatinine Ratio: 25 (ref 12–28)
BUN: 23 mg/dL (ref 8–27)
Bilirubin Total: 0.4 mg/dL (ref 0.0–1.2)
CO2: 24 mmol/L (ref 20–29)
Calcium: 10.2 mg/dL (ref 8.7–10.3)
Chloride: 105 mmol/L (ref 96–106)
Creatinine, Ser: 0.93 mg/dL (ref 0.57–1.00)
Globulin, Total: 2.7 g/dL (ref 1.5–4.5)
Glucose: 91 mg/dL (ref 70–99)
Potassium: 4.3 mmol/L (ref 3.5–5.2)
Sodium: 143 mmol/L (ref 134–144)
Total Protein: 7 g/dL (ref 6.0–8.5)
eGFR: 60 mL/min/{1.73_m2} (ref >59)

## 2023-03-21 LAB — LIPID PANEL
Chol/HDL Ratio: 2.9 ratio (ref 0.0–4.4)
Cholesterol, Total: 183 mg/dL (ref 100–199)
HDL: 64 mg/dL (ref >39)
LDL Chol Calc (NIH): 95 mg/dL (ref 0–99)
Triglycerides: 141 mg/dL (ref 0–149)
VLDL Cholesterol Cal: 24 mg/dL (ref 5–40)

## 2023-03-21 LAB — HEMOGLOBIN A1C
Est. average glucose Bld gHb Est-mCnc: 120 mg/dL
Hgb A1c MFr Bld: 5.8 % — ABNORMAL HIGH (ref 4.8–5.6)

## 2023-03-21 LAB — TSH: TSH: 0.608 u[IU]/mL (ref 0.450–4.500)

## 2023-03-21 NOTE — Telephone Encounter (Signed)
PA sent to plan-waiting on the outcome.

## 2023-03-21 NOTE — Progress Notes (Signed)
All labs are stable for you,including your A1c of 5.8, less than 4 month ago. Advised to continue with current diabetic regimen We will recheck you labs/cbc at your next appointment

## 2023-03-22 ENCOUNTER — Other Ambulatory Visit: Payer: Self-pay

## 2023-03-22 MED ORDER — LOSARTAN POTASSIUM-HCTZ 50-12.5 MG PO TABS: 1.0000 | ORAL_TABLET | Freq: Every day | ORAL | 3 refills | Status: DC

## 2023-03-22 NOTE — Telephone Encounter (Signed)
Outcome Denied on October 24 by [employer] 2017 NCPDP Request Reference Number: [id number]. LORATADINE TAB 10MG  is denied for not meeting the prior authorization requirement(s). Details of this decision are in the notice attached below or have been faxed to you. Drug Loratadine 10MG  tablets

## 2023-03-25 ENCOUNTER — Ambulatory Visit (INDEPENDENT_AMBULATORY_CARE_PROVIDER_SITE_OTHER): Payer: 59 | Admitting: Podiatry

## 2023-03-25 ENCOUNTER — Encounter: Payer: Self-pay | Admitting: Podiatry

## 2023-03-25 VITALS — Ht 61.0 in | Wt 219.0 lb

## 2023-03-25 DIAGNOSIS — M79674 Pain in right toe(s): Secondary | ICD-10-CM

## 2023-03-25 DIAGNOSIS — E0843 Diabetes mellitus due to underlying condition with diabetic autonomic (poly)neuropathy: Secondary | ICD-10-CM

## 2023-03-25 DIAGNOSIS — M79675 Pain in left toe(s): Secondary | ICD-10-CM

## 2023-03-25 DIAGNOSIS — B351 Tinea unguium: Secondary | ICD-10-CM

## 2023-03-25 NOTE — Progress Notes (Signed)
  Subjective:  Patient ID: Vanessa Vazquez, female    DOB: 12-Feb-1935,  MRN: [id number]  88 y.o. female presents at risk foot care with history of diabetic neuropathy and painful thick toenails that are difficult to trim. Pain interferes with ambulation. Aggravating factors include wearing enclosed shoe gear. Pain is relieved with periodic professional debridement. Chief Complaint  Patient presents with   Nail Problem    DFC, Pt is a diabetic, not sure of last A1C, last office visit was last week and PCP is Dr Mercy Moore.   New problem(s): None   PCP is [place], [place], PA-C.  No Known Allergies  Review of Systems: Negative except as noted in the HPI.   Objective:  Vanessa Vazquez is a pleasant 88 y.o. female WD, WN in NAD. AAO x 3.  Vascular Examination: Vascular status intact b/l with palpable pedal pulses. CFT immediate b/l. Pedal hair present. Edema b/l LE. No pain with calf compression b/l. Skin temperature gradient WNL b/l. No varicosities noted. No cyanosis or clubbing noted.  Neurological Examination: Pt has subjective symptoms of neuropathy. Sensation grossly intact b/l with 10 gram monofilament. Vibratory sensation intact b/l.  Dermatological Examination: Pedal skin with normal turgor, texture and tone b/l. No open wounds nor interdigital macerations noted. Toenails 1-5 b/l thick, discolored, elongated with subungual debris and pain on dorsal palpation. No hyperkeratotic lesions noted b/l.   Musculoskeletal Examination: Muscle strength 5/5 to b/l LE.  No pain, crepitus noted b/l. No gross pedal deformities. Patient ambulates independently without assistive aids.   Radiographs: None  Last A1c:      Latest Ref Rng & Units 03/20/2023    9:34 AM 10/23/2022    4:26 PM  Hemoglobin A1C  Hemoglobin-A1c 4.8 - 5.6 % 5.8  6.0     Assessment:   1. Pain due to onychomycosis of toenails of both feet   2. Diabetes mellitus due to underlying condition with diabetic autonomic neuropathy,  unspecified whether long term insulin use (HCC)    Plan:  -Consent given for treatment as described below: -Examined patient. -Patient to continue soft, supportive shoe gear daily. -Mycotic toenails 1-5 bilaterally were debrided in length and girth with sterile nail nippers and dremel without incident. -Patient/POA to call should there be question/concern in the interim.  Return in about 3 months (around 06/25/2023).  Freddie Breech, DPM

## 2023-04-05 ENCOUNTER — Encounter: Payer: 59 | Admitting: Nurse Practitioner

## 2023-04-05 ENCOUNTER — Encounter: Payer: Self-pay | Admitting: Nurse Practitioner

## 2023-04-10 ENCOUNTER — Encounter: Payer: Self-pay | Admitting: Cardiology

## 2023-04-10 ENCOUNTER — Ambulatory Visit: Payer: 59 | Attending: Cardiology | Admitting: Cardiology

## 2023-04-10 VITALS — BP 134/70 | HR 85 | Ht 64.0 in | Wt 217.0 lb

## 2023-04-10 DIAGNOSIS — Z6837 Body mass index (BMI) 37.0-37.9, adult: Secondary | ICD-10-CM

## 2023-04-10 DIAGNOSIS — R5383 Other fatigue: Secondary | ICD-10-CM | POA: Diagnosis not present

## 2023-04-10 DIAGNOSIS — I10 Essential (primary) hypertension: Secondary | ICD-10-CM

## 2023-04-10 DIAGNOSIS — R600 Localized edema: Secondary | ICD-10-CM

## 2023-04-10 NOTE — Progress Notes (Signed)
Cardiology Office Note:    Date:  04/10/2023   ID:  Vanessa Vazquez, DOB 1934-06-28, MRN [id number]  PCP:  Debera Lat, PA-C   [facility] Providers Cardiologist:  Debbe Odea, MD     Referring MD: Debera Lat, PA-C   Chief Complaint  Patient presents with   Follow-up    Patient reports continued weakness with low energy.  She feels that the fluid pill makes her feel more tired.  PCP changed Lasix to daily but patient not able to take daily dose so returned to prn use which is usually twice a week.  Patient stopped Ozempic due to making her feel bad.    History of Present Illness:    Vanessa Vazquez is a 88 y.o. female with a hx of hypertension, hyperlipidemia, diabetes, GERD, obesity who presents for follow-up.    Being seen for fatigue, dyspnea.  Did not tolerate Ozempic due to worsening fatigue after starting.  Hyzaar was reduced to 1 tablet daily, blood pressures have been normal with reducing.  States taking Lasix daily makes her fatigue worse.  Lasix was previously reduced to 1 tablet daily as needed, recently made standing per PCP.  Plans to obtain home sleep study equipment from sleep specialist.   Past Medical History:  Diagnosis Date   Asthma    Diabetes mellitus without complication (HCC)    GERD (gastroesophageal reflux disease)    Hypercholesteremia    Hypertension     Past Surgical History:  Procedure Laterality Date   CESAREAN SECTION     COLONOSCOPY WITH PROPOFOL N/A 03/19/2016   Procedure: COLONOSCOPY WITH PROPOFOL;  Surgeon: Scot Jun, MD;  Location: [facility] ENDOSCOPY;  Service: Endoscopy;  Laterality: N/A;    Current Medications: Current Meds  Medication Sig   albuterol (VENTOLIN HFA) 108 (90 Base) MCG/ACT inhaler Inhale 2 puffs into the lungs every 4 (four) hours as needed for wheezing or shortness of breath.   ferrous sulfate 325 (65 FE) MG tablet Take 325 mg by mouth once a week.   furosemide (LASIX) 20 MG tablet Take 1 tablet  (20 mg total) by mouth daily as needed.   glimepiride (AMARYL) 2 MG tablet TAKE 1 TABLET BY MOUTH TWICE DAILY   loratadine (CLARITIN) 10 MG tablet Take 1 tablet (10 mg total) by mouth daily.   losartan-hydrochlorothiazide (HYZAAR) 50-12.5 MG tablet Take 1 tablet by mouth daily.   metFORMIN (GLUCOPHAGE) 1000 MG tablet TAKE 1 TABLET BY MOUTH TWICE DAILY   Misc. Devices (WALKER) MISC 1 each by Does not apply route daily.   omeprazole (PRILOSEC) 20 MG capsule TAKE 1 CAPSULE BY MOUTH ONCE DAILY   Respiratory Therapy Supplies (NEBULIZER) DEVI 1 Device by Does not apply route 2 (two) times daily as needed.   rosuvastatin (CRESTOR) 5 MG tablet Take 1 tablet (5 mg total) by mouth daily.   sitaGLIPtin (JANUVIA) 100 MG tablet Take 1 tablet (100 mg total) by mouth daily.     Allergies:   Patient has no known allergies.   Social History   Socioeconomic History   Marital status: Widowed    Spouse name: Not on file   Number of children: Not on file   Years of education: Not on file   Highest education level: GED or equivalent  Occupational History   Not on file  Tobacco Use   Smoking status: Former   Smokeless tobacco: Never  Vaping Use   Vaping status: Never Used  Substance and Sexual Activity  Alcohol use: No    Alcohol/week: 0.0 standard drinks of alcohol   Drug use: No   Sexual activity: Never  Other Topics Concern   Not on file  Social History Narrative   Not on file   Social Determinants of Health   Financial Resource Strain: Low Risk  (01/15/2023)   Overall Financial Resource Strain (CARDIA)    Difficulty of Paying Living Expenses: Not hard at all  Food Insecurity: No Food Insecurity (01/15/2023)   Hunger Vital Sign    Worried About Running Out of Food in the Last Year: Never true    Ran Out of Food in the Last Year: Never true  Transportation Needs: No Transportation Needs (01/15/2023)   PRAPARE - Administrator, Civil Service (Medical): No    Lack of  Transportation (Non-Medical): No  Physical Activity: Inactive (01/15/2023)   Exercise Vital Sign    Days of Exercise per Week: 0 days    Minutes of Exercise per Session: 0 min  Stress: Stress Concern Present (01/15/2023)   [employer] of Occupational Health - Occupational Stress Questionnaire    Feeling of Stress : To some extent  Social Connections: Socially Isolated (01/15/2023)   Social Connection and Isolation Panel [NHANES]    Frequency of Communication with Friends and Family: Twice a week    Frequency of Social Gatherings with Friends and Family: Never    Attends Religious Services: More than 4 times per year    Active Member of [employer] or Organizations: No    Attends Banker Meetings: Never    Marital Status: Widowed     Family History: The patient's family history includes Asthma in her child; Diabetes in her sister; Heart attack in her mother; Heart disease in her mother; Leukemia in her sister; Lung cancer in her father.  ROS:   Please see the history of present illness.     All other systems reviewed and are negative.  EKGs/Labs/Other Studies Reviewed:    The following studies were reviewed today:       Recent Labs: 03/20/2023: ALT 15; BUN 23; Creatinine, Ser 0.93; Hemoglobin 11.7; Platelets 271; Potassium 4.3; Sodium 143; TSH 0.608  Recent Lipid Panel    Component Value Date/Time   CHOL 183 03/20/2023 0934   TRIG 141 03/20/2023 0934   HDL 64 03/20/2023 0934   CHOLHDL 2.9 03/20/2023 0934   CHOLHDL 3.5 01/16/2021 1530   LDLCALC 95 03/20/2023 0934   LDLCALC 103 (H) 01/16/2021 1530     Risk Assessment/Calculations:             Physical Exam:    VS:  BP 134/70 (BP Location: Left Arm, Patient Position: Sitting, Cuff Size: Large)   Pulse 85   Ht 5\' 4"  (1.626 m)   Wt 217 lb (98.4 kg)   SpO2 96%   BMI 37.25 kg/m     Wt Readings from Last 3 Encounters:  04/10/23 217 lb (98.4 kg)  04/05/23 221 lb 6.4 oz (100.4 kg)  03/25/23 219 lb (99.3  kg)     GEN:  Well nourished, well developed in no acute distress HEENT: Normal NECK: No JVD; No carotid bruits CARDIAC: RRR, no murmurs, rubs, gallops RESPIRATORY:  Clear to auscultation without rales, wheezing or rhonchi  ABDOMEN: Soft, non-tender, non-distended MUSCULOSKELETAL:  1-2+ edema; No deformity  SKIN: Warm and dry NEUROLOGIC:  Alert and oriented x 3 PSYCHIATRIC:  Normal affect   ASSESSMENT:    1. Primary hypertension   2.  BMI 37.0-37.9, adult   3. Bilateral leg edema   4. Fatigue, unspecified type     PLAN:    In order of problems listed above:  Hypertension, BP normal.  Continue Hyzaar ( losartan 50 mg, HCTZ 12 mg daily) 1 tablet daily .  obesity, diabetic.  Continue low-calorie diet, weight loss. Leg edema, morbid obesity contributing, continue Lasix 20 mg daily as needed. Fatigue, obesity, may have sleep apnea.  Echo 8/24  Follow-up in 6 months.      Medication Adjustments/Labs and Tests Ordered: Current medicines are reviewed at length with the patient today.  Concerns regarding medicines are outlined above.  No orders of the defined types were placed in this encounter.  No orders of the defined types were placed in this encounter.   Patient Instructions  Medication Instructions:   Your physician recommends that you continue on your current medications as directed. Please refer to the Current Medication list given to you today.  *If you need a refill on your cardiac medications before your next appointment, please call your pharmacy*   Lab Work:  None Ordered  If you have labs (blood work) drawn today and your tests are completely normal, you will receive your results only by: MyChart Message (if you have MyChart) OR A paper copy in the mail If you have any lab test that is abnormal or we need to change your treatment, we will call you to review the results.   Testing/Procedures:  None Ordered   Follow-Up: At [facility], you  and your health needs are our priority.  As part of our continuing mission to provide you with exceptional heart care, we have created designated Provider Care Teams.  These Care Teams include your primary Cardiologist (physician) and Advanced Practice Providers (APPs -  Physician Assistants and Nurse Practitioners) who all work together to provide you with the care you need, when you need it.  We recommend signing up for the patient portal called "MyChart".  Sign up information is provided on this After Visit Summary.  MyChart is used to connect with patients for Virtual Visits (Telemedicine).  Patients are able to view lab/test results, encounter notes, upcoming appointments, etc.  Non-urgent messages can be sent to your provider as well.   To learn more about what you can do with MyChart, go to [email or web address].    Your next appointment:   6 month(s)  Provider:   You may see Debbe Odea, MD or one of the following Advanced Practice Providers on your designated Care Team:   Nicolasa Ducking, NP Eula Listen, PA-C Cadence Fransico Michael, PA-C Charlsie Quest, NP Carlos Levering, NP   Signed, Debbe Odea, MD  04/10/2023 11:11 AM    [facility]

## 2023-04-10 NOTE — Patient Instructions (Signed)
 Medication Instructions:   Your physician recommends that you continue on your current medications as directed. Please refer to the Current Medication list given to you today.  *If you need a refill on your cardiac medications before your next appointment, please call your pharmacy*   Lab Work:  None Ordered  If you have labs (blood work) drawn today and your tests are completely normal, you will receive your results only by: MyChart Message (if you have MyChart) OR A paper copy in the mail If you have any lab test that is abnormal or we need to change your treatment, we will call you to review the results.   Testing/Procedures:  None Ordered   Follow-Up: At [facility], you and your health needs are our priority.  As part of our continuing mission to provide you with exceptional heart care, we have created designated Provider Care Teams.  These Care Teams include your primary Cardiologist (physician) and Advanced Practice Providers (APPs -  Physician Assistants and Nurse Practitioners) who all work together to provide you with the care you need, when you need it.  We recommend signing up for the patient portal called "MyChart".  Sign up information is provided on this After Visit Summary.  MyChart is used to connect with patients for Virtual Visits (Telemedicine).  Patients are able to view lab/test results, encounter notes, upcoming appointments, etc.  Non-urgent messages can be sent to your provider as well.   To learn more about what you can do with MyChart, go to [email or web address].    Your next appointment:   6 month(s)  Provider:   You may see Debbe Odea, MD or one of the following Advanced Practice Providers on your designated Care Team:   Nicolasa Ducking, NP Eula Listen, PA-C Cadence Fransico Michael, PA-C Charlsie Quest, NP Carlos Levering, NP

## 2023-05-02 ENCOUNTER — Ambulatory Visit: Payer: 59

## 2023-05-02 DIAGNOSIS — E66813 Obesity, class 3: Secondary | ICD-10-CM

## 2023-05-02 DIAGNOSIS — G4719 Other hypersomnia: Secondary | ICD-10-CM

## 2023-05-30 ENCOUNTER — Ambulatory Visit: Payer: 59 | Admitting: Nurse Practitioner

## 2023-05-30 ENCOUNTER — Encounter: Payer: Self-pay | Admitting: Nurse Practitioner

## 2023-05-30 VITALS — BP 128/60 | HR 79 | Temp 97.6°F | Ht 64.0 in | Wt 221.0 lb

## 2023-05-30 DIAGNOSIS — G4733 Obstructive sleep apnea (adult) (pediatric): Secondary | ICD-10-CM | POA: Diagnosis not present

## 2023-05-30 DIAGNOSIS — G4700 Insomnia, unspecified: Secondary | ICD-10-CM

## 2023-05-30 NOTE — Progress Notes (Signed)
 @Patient  ID: Vanessa Vazquez, female    DOB: 06-Jun-1934, 89 y.o.   MRN: [id number]  Chief Complaint  Patient presents with   Follow-up    05/08/23 completed home sleep study.     Referring provider: Ostwalt, Janna, PA-C  HPI: 89 year old female, former smoker referred for sleep consult September 2024.  Past medical history significant for AVM of colon, hypertension, DM, neuropathy, obesity, IDA.   TEST/EVENTS:  05/08/2023 HST: 2 night study AHI 12.5/h, 9.5/h, SpO2 low 72%   02/08/2023: OV with Bevelyn Arriola NP for sleep consult with her daughter.  She has had trouble with her sleep quality for many years now.  She wakes up throughout the night and cannot seem to go back to sleep.  She feels very tired during the day.  Wakes feeling poorly rested.  Doesn't nap during the day. Has never been told that she snores.  Denies any morning headaches, drowsy driving, sleep parasomnia/paralysis. She goes to bed around 10 PM.  Usually falls asleep in under an hour.  Wakes 1 or more times a night.  Usually gets out of the bed around 7:30 AM.  She is retired.  Weight has fluctuated over the last 2 years.  Never had a previous sleep study.  Does not wear supplemental oxygen . She has a high blood pressure and diabetes, well-controlled on medications.  No history of stroke.  She is a former smoker.  Does not drink any alcohol.  No excessive caffeine intake.  No sleep aids.  Lives by herself.  Has adult children.  Family history of heart disease and cancer. Epworth 5  05/30/2023: Today - follow up Patient presents today for follow-up to discuss home sleep study results with her daughter.  Her sleep study revealed mild sleep apnea with overall apnea index 4.1/h.  She feels unchanged compared to her last visit.  She feels like she still able to do the things that she wants to do.  She does not typically drive but when she does she does not have any issues drowsy driving.  No sleep parasomnia/paralysis.  No sleep aids.  No  Known Allergies  Immunization History  Administered Date(s) Administered   Fluad Trivalent(High Dose 65+) 03/20/2023   Influenza, Seasonal, Injecte, Preservative Fre 04/23/2006, 03/25/2007, 03/10/2009, 03/07/2010   Pneumococcal Polysaccharide-23 04/12/2003    Past Medical History:  Diagnosis Date   Asthma    Diabetes mellitus without complication (HCC)    GERD (gastroesophageal reflux disease)    Hypercholesteremia    Hypertension     Tobacco History: Social History   Tobacco Use  Smoking Status Former  Smokeless Tobacco Never   Counseling given: Not Answered   Outpatient Medications Prior to Visit  Medication Sig Dispense Refill   albuterol  (VENTOLIN  HFA) 108 (90 Base) MCG/ACT inhaler Inhale 2 puffs into the lungs every 4 (four) hours as needed for wheezing or shortness of breath. 18 g 6   ferrous sulfate  325 (65 FE) MG tablet Take 325 mg by mouth once a week.     furosemide  (LASIX ) 20 MG tablet Take 1 tablet (20 mg total) by mouth daily as needed. 90 tablet 3   glimepiride  (AMARYL ) 2 MG tablet TAKE 1 TABLET BY MOUTH TWICE DAILY 180 tablet 3   loratadine  (CLARITIN ) 10 MG tablet Take 1 tablet (10 mg total) by mouth daily. 90 tablet 3   losartan -hydrochlorothiazide  (HYZAAR) 50-12.5 MG tablet Take 1 tablet by mouth daily. 90 tablet 3   metFORMIN  (GLUCOPHAGE ) 1000 MG tablet TAKE 1  TABLET BY MOUTH TWICE DAILY 180 tablet 3   Misc. Devices (WALKER) MISC 1 each by Does not apply route daily. 1 each 0   omeprazole  (PRILOSEC) 20 MG capsule TAKE 1 CAPSULE BY MOUTH ONCE DAILY 30 capsule 3   Respiratory Therapy Supplies (NEBULIZER) DEVI 1 Device by Does not apply route 2 (two) times daily as needed. 1 each 0   rosuvastatin  (CRESTOR ) 5 MG tablet Take 1 tablet (5 mg total) by mouth daily. 90 tablet 3   sitaGLIPtin  (JANUVIA ) 100 MG tablet Take 1 tablet (100 mg total) by mouth daily. 90 tablet 3   gabapentin  (NEURONTIN ) 300 MG capsule Take 1 capsule (300 mg total) by mouth at bedtime.  (Patient not taking: Reported on 05/30/2023) 30 capsule 2   Semaglutide , 1 MG/DOSE, 4 MG/3ML SOPN Inject 1 mg into the skin once a week. For 4 weeks (Patient not taking: Reported on 05/30/2023) 3 mL 0   No facility-administered medications prior to visit.     Review of Systems:   Constitutional: No night sweats, fevers, chills, or lassitude. +weight change, fatigue  HEENT: No headaches, difficulty swallowing, tooth/dental problems, or sore throat. No sneezing, itching, ear ache, nasal congestion, or post nasal drip CV:  No chest pain, orthopnea, PND, swelling in lower extremities, anasarca, dizziness, palpitations, syncope Resp: +baseline shortness of breath with exertion. No excess mucus or change in color of mucus. No productive or non-productive. No hemoptysis. No wheezing.  No chest wall deformity GI:  No heartburn, indigestion GU: No dysuria, change in color of urine, urgency or frequency.   Skin: No rash, lesions, ulcerations MSK:  No joint pain or swelling.   Neuro: No dizziness or lightheadedness.  Psych: No depression. +anxiety (stable). Mood stable. +sleep disturbance     Physical Exam:  BP 128/60 (BP Location: Left Arm, Patient Position: Sitting, Cuff Size: Normal)   Pulse 79   Temp 97.6 F (36.4 C) (Temporal)   Ht 5' 4 (1.626 m)   Wt 221 lb (100.2 kg)   SpO2 97%   BMI 37.93 kg/m   GEN: Pleasant, interactive, well-kempt; obese; in no acute distress. HEENT:  Normocephalic and atraumatic. PERRLA. Sclera white. Nasal turbinates pink, moist and patent bilaterally. No rhinorrhea present. Oropharynx pink and moist, without exudate or edema. No lesions, ulcerations, or postnasal drip. Mallampati III NECK:  Supple w/ fair ROM. No JVD present. Normal carotid impulses w/o bruits. Thyroid  symmetrical with no goiter or nodules palpated. No lymphadenopathy.   CV: RRR, no m/r/g, no peripheral edema. Pulses intact, +2 bilaterally. No cyanosis, pallor or clubbing. PULMONARY:  Unlabored,  regular breathing. Clear bilaterally A&P w/o wheezes/rales/rhonchi. No accessory muscle use.  GI: BS present and normoactive. Soft, non-tender to palpation. No organomegaly or masses detected.  MSK: No erythema, warmth or tenderness. Cap refil <2 sec all extrem. No deformities or joint swelling noted.  Neuro: A/Ox3. No focal deficits noted.   Skin: Warm, no lesions or rashe Psych: Normal affect and behavior. Judgement and thought content appropriate.     Lab Results:  CBC    Component Value Date/Time   WBC 5.6 03/20/2023 0934   WBC 4.5 11/16/2022 1116   RBC 3.70 (L) 03/20/2023 0934   RBC 4.04 11/16/2022 1116   HGB 11.7 03/20/2023 0934   HCT 36.5 03/20/2023 0934   PLT 271 03/20/2023 0934   MCV 99 (H) 03/20/2023 0934   MCH 31.6 03/20/2023 0934   MCH 30.4 11/16/2022 1116   MCHC 32.1 03/20/2023 0934   MCHC  31.4 11/16/2022 1116   RDW 11.4 (L) 03/20/2023 0934   LYMPHSABS 1.8 03/20/2023 0934   MONOABS 0.4 05/19/2022 0722   EOSABS 0.1 03/20/2023 0934   BASOSABS 0.1 03/20/2023 0934    BMET    Component Value Date/Time   NA 143 03/20/2023 0934   K 4.3 03/20/2023 0934   CL 105 03/20/2023 0934   CO2 24 03/20/2023 0934   GLUCOSE 91 03/20/2023 0934   GLUCOSE 192 (H) 11/16/2022 1116   BUN 23 03/20/2023 0934   CREATININE 0.93 03/20/2023 0934   CREATININE 0.94 04/11/2022 1042   CALCIUM  10.2 03/20/2023 0934   GFRNONAA 50 (L) 11/16/2022 1116   GFRNONAA 62 11/23/2019 1059   GFRAA 72 11/23/2019 1059    BNP    Component Value Date/Time   BNP 35.6 01/01/2022 1357     Imaging:  No results found.  Administration History     None           No data to display          No results found for: NITRICOXIDE      Assessment & Plan:   Mild obstructive sleep apnea Mild OSA.  Reviewed minimal cardiovascular health risks associated with mild sleep apnea.  Discussed potential treatment options.  She declined oral appliance or CPAP therapy.  Will utilize a side-lying  sleeping position.  Healthy weight loss encouraged.  Aware of safe driving practices.  Understands to notify if symptoms worsen or if she changes her mind about treatment options.  Patient Instructions  Sleep study showed mild sleep apnea. This does not pose much risk as far as health concerns and heart burden goes. We also briefly reviewed treatment options including weight loss, side sleeping position, oral appliance, CPAP therapy. You have opted to monitor your symptoms and notify of any worsening  Follow up as needed     Insomnia See above. Prefers to not add any pharmacological therapies on, which I would agree with given her age and fall risk. Sleep hygiene reviewed. Encouraged to work on graded exercises and healthy weight loss measures.     I spent 22 minutes of dedicated to the care of this patient on the date of this encounter to include pre-visit review of records, face-to-face time with the patient discussing conditions above, post visit ordering of testing, clinical documentation with the electronic health record, making appropriate referrals as documented, and communicating necessary findings to members of the patients care team.  Comer LULLA Rouleau, NP 05/30/2023  Pt aware and understands NP's role.

## 2023-05-30 NOTE — Assessment & Plan Note (Signed)
 Mild OSA.  Reviewed minimal cardiovascular health risks associated with mild sleep apnea.  Discussed potential treatment options.  She declined oral appliance or CPAP therapy.  Will utilize a side-lying sleeping position.  Healthy weight loss encouraged.  Aware of safe driving practices.  Understands to notify if symptoms worsen or if she changes her mind about treatment options.  Patient Instructions  Sleep study showed mild sleep apnea. This does not pose much risk as far as health concerns and heart burden goes. We also briefly reviewed treatment options including weight loss, side sleeping position, oral appliance, CPAP therapy. You have opted to monitor your symptoms and notify of any worsening  Follow up as needed

## 2023-05-30 NOTE — Assessment & Plan Note (Addendum)
 See above. Prefers to not add any pharmacological therapies on, which I would agree with given her age and fall risk. Sleep hygiene reviewed. Encouraged to work on graded exercises and healthy weight loss measures.

## 2023-05-30 NOTE — Patient Instructions (Signed)
 Sleep study showed mild sleep apnea. This does not pose much risk as far as health concerns and heart burden goes. We also briefly reviewed treatment options including weight loss, side sleeping position, oral appliance, CPAP therapy. You have opted to monitor your symptoms and notify of any worsening  Follow up as needed

## 2023-06-04 ENCOUNTER — Other Ambulatory Visit: Payer: Self-pay | Admitting: Physician Assistant

## 2023-06-04 DIAGNOSIS — R079 Chest pain, unspecified: Secondary | ICD-10-CM

## 2023-06-04 MED ORDER — OMEPRAZOLE 20 MG PO CPDR: 20.0000 mg | DELAYED_RELEASE_CAPSULE | Freq: Every day | ORAL | 3 refills | Status: DC

## 2023-06-04 NOTE — Telephone Encounter (Signed)
[facility] pharmacy faxed refill request for the following medications:    omeprazole (PRILOSEC) 20 MG capsule    Please advise

## 2023-06-17 ENCOUNTER — Telehealth: Payer: Self-pay | Admitting: Physician Assistant

## 2023-06-17 MED ORDER — METFORMIN HCL 1000 MG PO TABS: 1000.0000 mg | ORAL_TABLET | Freq: Two times a day (BID) | ORAL | 0 refills | Status: DC

## 2023-06-17 NOTE — Telephone Encounter (Signed)
Has an appt 1/23

## 2023-06-17 NOTE — Addendum Note (Signed)
Addended by: Davene Costain on: 06/17/2023 03:40 PM   Modules accepted: Orders

## 2023-06-17 NOTE — Telephone Encounter (Signed)
[facility] pharmacy faxed refill request for the following medications:   metFORMIN (GLUCOPHAGE) 1000 MG tablet    Please advise

## 2023-06-20 ENCOUNTER — Telehealth: Payer: Self-pay | Admitting: Physician Assistant

## 2023-06-20 ENCOUNTER — Ambulatory Visit: Payer: 59 | Admitting: Physician Assistant

## 2023-06-20 MED ORDER — GLIMEPIRIDE 2 MG PO TABS: 2.0000 mg | ORAL_TABLET | Freq: Two times a day (BID) | ORAL | 0 refills | Status: DC

## 2023-06-20 NOTE — Telephone Encounter (Signed)
Called patient provider is out sick today and left pt message that we need to reschedule

## 2023-06-20 NOTE — Addendum Note (Signed)
Addended by: Shirley Muscat on: 06/20/2023 11:26 AM   Modules accepted: Orders

## 2023-06-20 NOTE — Telephone Encounter (Signed)
Patient is totally out of Glimepiride 2 mg. Had to resch her appt today since [place] is out of the office.  Can  you please send her a refill??  She is coming in on Monday for f/u with [place].

## 2023-06-24 ENCOUNTER — Ambulatory Visit (INDEPENDENT_AMBULATORY_CARE_PROVIDER_SITE_OTHER): Payer: 59 | Admitting: Physician Assistant

## 2023-06-24 ENCOUNTER — Encounter: Payer: Self-pay | Admitting: Physician Assistant

## 2023-06-24 VITALS — BP 136/73 | HR 77 | Temp 98.5°F | Ht 64.0 in | Wt 221.0 lb

## 2023-06-24 DIAGNOSIS — E7849 Other hyperlipidemia: Secondary | ICD-10-CM

## 2023-06-24 DIAGNOSIS — G4739 Other sleep apnea: Secondary | ICD-10-CM | POA: Diagnosis not present

## 2023-06-24 DIAGNOSIS — Z7984 Long term (current) use of oral hypoglycemic drugs: Secondary | ICD-10-CM

## 2023-06-24 DIAGNOSIS — E1159 Type 2 diabetes mellitus with other circulatory complications: Secondary | ICD-10-CM | POA: Diagnosis not present

## 2023-06-24 DIAGNOSIS — Z711 Person with feared health complaint in whom no diagnosis is made: Secondary | ICD-10-CM | POA: Diagnosis not present

## 2023-06-24 DIAGNOSIS — E119 Type 2 diabetes mellitus without complications: Secondary | ICD-10-CM

## 2023-06-24 DIAGNOSIS — M8588 Other specified disorders of bone density and structure, other site: Secondary | ICD-10-CM

## 2023-06-24 DIAGNOSIS — I152 Hypertension secondary to endocrine disorders: Secondary | ICD-10-CM | POA: Diagnosis not present

## 2023-06-24 LAB — POCT GLYCOSYLATED HEMOGLOBIN (HGB A1C)

## 2023-06-24 NOTE — Progress Notes (Signed)
Established patient visit  Patient: Vanessa Vazquez   DOB: 02/03/1935   89 y.o. Female  MRN: [id number] Visit Date: 06/24/2023  Today's healthcare provider: Debera Lat, PA-C   Chief Complaint  Patient presents with   Diabetes    Patient states she has been getting readings around 130's   Subjective     Discussed the use of AI scribe software for clinical note transcription with the patient, who gave verbal consent to proceed.  History of Present Illness   The patient, an elderly woman with a history of diabetes and hypertension, presents for a routine check-up. She has been managing her diabetes with glimepiride, metformin, and Januvia. However, she reports having stopped taking metformin for about a week due to a lack of refills, during which she did not experience any episodes of weakness that she usually experiences when she doesn't eat at the right time. She expresses a preference to continue without metformin, but agrees to monitor her blood sugar daily for the next month.  The patient's hypertension is managed with Hyzaar and Lasix. She does not report any issues with her blood pressure at home, typically recording readings around 120.  The patient also reports memory issues, acknowledging that she often forgets things. Her daughter confirms this, noting that the patient sometimes struggles to remember certain words.  The patient's general health maintenance includes taking calcium, vitamin D, and multivitamins. She is active, attending her great-grandson's basketball games and reading regularly. She lives alone but has frequent visits from her children and grandchildren.           01/15/2023    1:10 PM 01/01/2023    9:33 AM 12/18/2022   10:59 AM  Depression screen PHQ 2/9  Decreased Interest 0 0 0  Down, Depressed, Hopeless 0 0 0  PHQ - 2 Score 0 0 0       No data to display          Medications: Outpatient Medications Prior to Visit  Medication Sig   albuterol  (VENTOLIN HFA) 108 (90 Base) MCG/ACT inhaler Inhale 2 puffs into the lungs every 4 (four) hours as needed for wheezing or shortness of breath.   ferrous sulfate 325 (65 FE) MG tablet Take 325 mg by mouth once a week.   furosemide (LASIX) 20 MG tablet Take 1 tablet (20 mg total) by mouth daily as needed.   gabapentin (NEURONTIN) 300 MG capsule Take 1 capsule (300 mg total) by mouth at bedtime.   glimepiride (AMARYL) 2 MG tablet Take 1 tablet (2 mg total) by mouth 2 (two) times daily.   loratadine (CLARITIN) 10 MG tablet Take 1 tablet (10 mg total) by mouth daily.   losartan-hydrochlorothiazide (HYZAAR) 50-12.5 MG tablet Take 1 tablet by mouth daily.   metFORMIN (GLUCOPHAGE) 1000 MG tablet Take 1 tablet (1,000 mg total) by mouth 2 (two) times daily.   Misc. Devices (WALKER) MISC 1 each by Does not apply route daily.   omeprazole (PRILOSEC) 20 MG capsule Take 1 capsule (20 mg total) by mouth daily.   Respiratory Therapy Supplies (NEBULIZER) DEVI 1 Device by Does not apply route 2 (two) times daily as needed.   rosuvastatin (CRESTOR) 5 MG tablet Take 1 tablet (5 mg total) by mouth daily.   sitaGLIPtin (JANUVIA) 100 MG tablet Take 1 tablet (100 mg total) by mouth daily.   Semaglutide, 1 MG/DOSE, 4 MG/3ML SOPN Inject 1 mg into the skin once a week. For 4 weeks (Patient not taking: Reported on  06/24/2023)   No facility-administered medications prior to visit.    Review of Systems All negative Except see HPI       Objective    BP 136/73 (BP Location: Left Arm, Patient Position: Sitting, Cuff Size: Normal)   Pulse 77   Temp 98.5 F (36.9 C) (Oral)   Ht 5\' 4"  (1.626 m)   Wt 221 lb (100.2 kg)   SpO2 97%   BMI 37.93 kg/m     Physical Exam Vitals reviewed.  Constitutional:      General: She is not in acute distress.    Appearance: Normal appearance. She is well-developed. She is obese. She is not diaphoretic.  HENT:     Head: Normocephalic and atraumatic.  Eyes:     General: No  scleral icterus.    Conjunctiva/sclera: Conjunctivae normal.  Neck:     Thyroid: No thyromegaly.  Cardiovascular:     Rate and Rhythm: Normal rate and regular rhythm.     Pulses: Normal pulses.     Heart sounds: Normal heart sounds. No murmur heard. Pulmonary:     Effort: Pulmonary effort is normal. No respiratory distress.     Breath sounds: Normal breath sounds. No wheezing, rhonchi or rales.  Musculoskeletal:     Cervical back: Neck supple.     Right lower leg: No edema.     Left lower leg: No edema.  Lymphadenopathy:     Cervical: No cervical adenopathy.  Skin:    General: Skin is warm and dry.     Findings: No rash.  Neurological:     Mental Status: She is alert and oriented to person, place, and time. Mental status is at baseline.  Psychiatric:        Mood and Affect: Mood normal.        Behavior: Behavior normal.      No results found for any visits on 06/24/23.      Assessment and Plan    Sleep Apnea Mild sleep apnea diagnosed by sleep study. Patient declined CPAP machine and is managing symptoms with sleep hygiene. -Continue current management.  Osteopenia chronic last dexa scan was done in 2023 Patient is on calcium and vitamin D supplementation. Discussed fall prevention and safety at home. -Continue calcium and vitamin D supplementation. -Continue fall prevention measures at home.  Type 2 Diabetes Mellitus Patient reports inconsistent use of metformin and glimepiride. A1C is 6.2. Patient prefers to continue only glimepiride and Januvia. -Continue glimepiride and Januvia. -Check blood glucose daily. -Discontinue /temporarily metformin per patient preference.  Hypertension Patient is on Hyzaar (losartan 50 and hydrochlorothiazide 12.5). Blood pressure slightly elevated at visit. -Continue Hyzaar. questioning pt's compliance due to memory issues. Will reevaluated memory at the fu -Consider blood pressure monitoring at  home.  Hyperlipidemia Chronic Last ldl 95 Continue crestor 5 mg Will repeat LP at the  follow-up  Memory Concerns Patient reports forgetfulness. Discussed potential evaluation by a neurologist and the importance of medication adherence. -Consider neurology referral for memory evaluation at her next appt, per pt preference. Pt was seen by NP-C suzanne Lowe/home call covered by united [employer] insurance. Advised to revisit cognitive screening. Pt declined during OV and preferred to revisit it at her next appointment   General Health Maintenance -Order labs including lipid panel, TSH, CBC, and kidney function tests. -Schedule follow-up appointment in one month.      Orders Placed This Encounter  Procedures   CBC with Differential/Platelet   Basic metabolic panel   AMB  Referral VBCI Care Management    Referral Priority:   Routine    Referral Type:   Consultation    Referral Reason:   Care Coordination    Number of Visits Requested:   1   POCT glycosylated hemoglobin (Hb A1C)    Return in about 4 weeks (around 07/22/2023) for chronic disease f/u.   The patient was advised to call back or seek an in-person evaluation if the symptoms worsen or if the condition fails to improve as anticipated.  I discussed the assessment and treatment plan with the patient. The patient was provided an opportunity to ask questions and all were answered. The patient agreed with the plan and demonstrated an understanding of the instructions.  I, Debera Lat, PA-C have reviewed all documentation for this visit. The documentation on 06/24/2023  for the exam, diagnosis, procedures, and orders are all accurate and complete.  Debera Lat, [facility], MMS [facility] [phone] (phone) [phone] (fax)  [facility]

## 2023-06-25 ENCOUNTER — Telehealth: Payer: Self-pay

## 2023-06-25 NOTE — Progress Notes (Signed)
Care Guide Pharmacy Note  06/25/2023 Name: Vanessa Vazquez MRN: [id number] DOB: 01/21/1935  Referred By: Debera Lat, PA-C Reason for referral: Care Coordination (Outreach to schedule with Pharm d )   Vanessa Vazquez is a 89 y.o. year old female who is a primary care patient of Debera Lat, [place].  Vanessa Vazquez was referred to the pharmacist for assistance related to: HTN and DMII  Successful contact was made with the patient to discuss pharmacy services including being ready for the pharmacist to call at least 5 minutes before the scheduled appointment time and to have medication bottles and any blood pressure readings ready for review. The patient agreed to meet with the pharmacist via telephone visit on (date/time).07/08/2023  Vanessa Vazquez , RMA     [place]  [facility], [facility] Guide  Direct Dial: [phone]  Website: [email or web address]

## 2023-06-26 ENCOUNTER — Ambulatory Visit: Payer: Self-pay

## 2023-06-26 NOTE — Telephone Encounter (Signed)
Summary: questions about blood sugar reading   Pt daughter Elease Hashimoto requests call back as she has questions about pt getting her blood sugar reading. Cb# [phone]     Chief Complaint: Daughter asking if pt. Can stop Metformin and should she check BS daily. Symptoms: Above Frequency: n/a Pertinent Negatives: Patient denies  Disposition: [] ED /[] Urgent Care (no appt availability in office) / [] Appointment(In office/virtual)/ []  [facility]/ [] Home Care/ [] Refused Recommended Disposition /[] [place] [facility]/ [x]  Follow-up with PCP Additional Notes: Please advise daughter.  Reason for Disposition  [1] Caller has URGENT medicine question about med that PCP or specialist prescribed AND [2] triager unable to answer question  Answer Assessment - Initial Assessment Questions 1. NAME of MEDICINE: "What medicine(s) are you calling about?"     Metformin 2. QUESTION: "What is your question?" (e.g., double dose of medicine, side effect)     Can she stop the Metformin 3. PRESCRIBER: "Who prescribed the medicine?" Reason: if prescribed by specialist, call should be referred to that group.     Ostwalt 4. SYMPTOMS: "Do you have any symptoms?" If Yes, ask: "What symptoms are you having?"  "How bad are the symptoms (e.g., mild, moderate, severe)     N/a 5. PREGNANCY:  "Is there any chance that you are pregnant?" "When was your last menstrual period?"     no  Protocols used: Medication Question Call-A-AH

## 2023-06-27 ENCOUNTER — Encounter: Payer: Self-pay | Admitting: Podiatry

## 2023-06-27 ENCOUNTER — Ambulatory Visit (INDEPENDENT_AMBULATORY_CARE_PROVIDER_SITE_OTHER): Payer: 59 | Admitting: Podiatry

## 2023-06-27 VITALS — Ht 64.0 in | Wt 221.0 lb

## 2023-06-27 DIAGNOSIS — M79674 Pain in right toe(s): Secondary | ICD-10-CM

## 2023-06-27 DIAGNOSIS — E0843 Diabetes mellitus due to underlying condition with diabetic autonomic (poly)neuropathy: Secondary | ICD-10-CM

## 2023-06-27 DIAGNOSIS — M79675 Pain in left toe(s): Secondary | ICD-10-CM | POA: Diagnosis not present

## 2023-06-27 DIAGNOSIS — B351 Tinea unguium: Secondary | ICD-10-CM

## 2023-06-30 NOTE — Progress Notes (Signed)
  Subjective:  Patient ID: Vanessa Vazquez, female    DOB: 1935/03/18,  MRN: [id number]  Vanessa Vazquez presents to clinic today for at risk foot care with history of diabetic neuropathy and painful, elongated thickened toenails x 10 which are symptomatic when wearing enclosed shoe gear. This interferes with his/her daily activities.  Chief Complaint  Patient presents with   Nail Problem    Pt is here for [facility] unsure of last A1C PCP is Dr Herminio Commons and LOV was this week.   New problem(s): None.   PCP is Ostwalt, [place], PA-C.  No Known Allergies  Review of Systems: Negative except as noted in the HPI.  Objective: No changes noted in today's physical examination. There were no vitals filed for this visit. Vanessa Vazquez is a pleasant 89 y.o. female in NAD. AAO x 3.  Vascular Examination: Vascular status intact b/l with palpable pedal pulses. CFT immediate b/l. Pedal hair present. Edema b/l LE. No pain with calf compression b/l. Skin temperature gradient WNL b/l. No varicosities noted. No cyanosis or clubbing noted.  Neurological Examination: Pt has subjective symptoms of neuropathy. Sensation grossly intact b/l with 10 gram monofilament. Vibratory sensation intact b/l.  Dermatological Examination: Pedal skin with normal turgor, texture and tone b/l. No open wounds nor interdigital macerations noted. Toenails 1-5 b/l thick, discolored, elongated with subungual debris and pain on dorsal palpation. No hyperkeratotic lesions noted b/l.   Musculoskeletal Examination: Muscle strength 5/5 to b/l LE.  No pain, crepitus noted b/l. No gross pedal deformities. Patient ambulates independently without assistive aids.   Radiographs: None  Assessment/Plan: 1. Pain due to onychomycosis of toenails of both feet   2. Diabetes mellitus due to underlying condition with diabetic autonomic neuropathy, unspecified whether long term insulin use (HCC)    -Patient was evaluated today. All questions/concerns  addressed on today's visit. -Patient to continue soft, supportive shoe gear daily. -Toenails 1-5 b/l were debrided in length and girth with sterile nail nippers and dremel without iatrogenic bleeding.  -Patient/POA to call should there be question/concern in the interim.   Return in about 3 months (around 09/25/2023).  Freddie Breech, DPM      [facility] LOCATION: [address], [place] [address]                   Office [phone]   [facility] LOCATION: [address] [place], [place] [address] Office [phone]

## 2023-07-08 ENCOUNTER — Other Ambulatory Visit: Payer: Self-pay | Admitting: Pharmacist

## 2023-07-08 DIAGNOSIS — E118 Type 2 diabetes mellitus with unspecified complications: Secondary | ICD-10-CM

## 2023-07-08 MED ORDER — ALBUTEROL SULFATE HFA 108 (90 BASE) MCG/ACT IN AERS: 2.0000 | INHALATION_SPRAY | RESPIRATORY_TRACT | 2 refills | Status: AC | PRN

## 2023-07-08 MED ORDER — GLIMEPIRIDE 2 MG PO TABS: 2.0000 mg | ORAL_TABLET | Freq: Two times a day (BID) | ORAL | 0 refills | Status: DC

## 2023-07-08 NOTE — Progress Notes (Signed)
   07/08/2023  Patient ID: Vanessa Vazquez, female   DOB: 1934/06/26, 89 y.o.   MRN: [id number]  Called and spoke with the patient on the phone today for a medication review.  Confirms good adherence with medications with use of a weekly pillbox placed at living room chair where she usually sits. Believes this is the best place to remind herself to take her medications.  Admits to forgetting her morning meds today due to waking up with a sore throat and getting busy with other things. Reports this is unusual for her to forget. Discussed some adherence strategies.  Continues holding metformin  as she said her "weak spells" resolved after discontinuation. Average FBG of 170, which is about A1c of 7.5%. Advised she will likely need to restart the Metformin  at a lower dose. Confirmed understanding and will discuss at next PCP visit to restart if needed.  For BP, does not check at home. Says monitor fell apart in 2 pieces after each use. Could check it, but has not been. Advised if not monitoring, to ensure she takes her BP medication the morning of her doctors visits and avoid coffee as her reading was slightly elevated at the last visit.  Needs refills for Albuterol  and Glimepiride - sending Rx to the pharmacy. No further concerns requiring follow-up at this time.    Kenneth Peace, PharmD [facility] Phone Number: [phone]

## 2023-07-26 ENCOUNTER — Ambulatory Visit: Payer: 59 | Admitting: Physician Assistant

## 2023-07-26 ENCOUNTER — Encounter: Payer: Self-pay | Admitting: Physician Assistant

## 2023-07-26 VITALS — BP 138/57 | HR 66 | Ht 64.0 in | Wt 221.0 lb

## 2023-07-26 DIAGNOSIS — E119 Type 2 diabetes mellitus without complications: Secondary | ICD-10-CM | POA: Diagnosis not present

## 2023-07-26 DIAGNOSIS — E7849 Other hyperlipidemia: Secondary | ICD-10-CM | POA: Diagnosis not present

## 2023-07-26 DIAGNOSIS — N1831 Chronic kidney disease, stage 3a: Secondary | ICD-10-CM

## 2023-07-26 DIAGNOSIS — R413 Other amnesia: Secondary | ICD-10-CM | POA: Diagnosis not present

## 2023-07-26 DIAGNOSIS — I152 Hypertension secondary to endocrine disorders: Secondary | ICD-10-CM | POA: Diagnosis not present

## 2023-07-26 DIAGNOSIS — E1159 Type 2 diabetes mellitus with other circulatory complications: Secondary | ICD-10-CM

## 2023-07-26 DIAGNOSIS — Z7984 Long term (current) use of oral hypoglycemic drugs: Secondary | ICD-10-CM

## 2023-07-26 NOTE — Progress Notes (Signed)
 Established patient visit  Patient: Vanessa Vazquez   DOB: May 10, 1935   89 y.o. Female  MRN: [id number] Visit Date: 07/26/2023  Today's healthcare provider: Debera Lat, PA-C   Chief Complaint  Patient presents with   Follow-up    No concerns, she has stop taking metformin    Subjective     Discussed the use of AI scribe software for clinical note transcription with the patient, who gave verbal consent to proceed.  History of Present Illness   The patient, with a history of diabetes and heart disease, presents for a follow-up visit. She reports high blood sugar levels, despite taking glipizide and Januvia. The patient admits to consuming sweets, including candies, which may be contributing to the elevated blood sugar levels. The patient has stopped taking metformin, but after discussion with the doctor, agrees to restart it at a lower dose.  The patient also has a history of heart disease and is taking a medication prescribed by a cardiologist. There is a discrepancy about whether the patient has a three-month supply of this medication. The patient denies having the medication at home, and the doctor plans to contact the pharmacist to resolve the issue.  The patient reports occasional shortness of breath, but does not want to pursue any vaccinations at this time. The patient also has reported memory issues, but does not wish to be referred to a neurologist at this time.           07/26/2023   10:16 AM 01/15/2023    1:10 PM 01/01/2023    9:33 AM  Depression screen PHQ 2/9  Decreased Interest 0 0 0  Down, Depressed, Hopeless 0 0 0  PHQ - 2 Score 0 0 0  Altered sleeping 0    Tired, decreased energy 1    Change in appetite 0    Feeling bad or failure about yourself  0    Trouble concentrating 0    Moving slowly or fidgety/restless 0    Suicidal thoughts 0    PHQ-9 Score 1    Difficult doing work/chores Not difficult at all        07/26/2023   10:16 AM  GAD 7 : Generalized  Anxiety Score  Nervous, Anxious, on Edge 0  Control/stop worrying 0  Worry too much - different things 0  Trouble relaxing 0  Restless 0  Easily annoyed or irritable 0  Afraid - awful might happen 0  Total GAD 7 Score 0  Anxiety Difficulty Not difficult at all    Medications: Outpatient Medications Prior to Visit  Medication Sig   albuterol (VENTOLIN HFA) 108 (90 Base) MCG/ACT inhaler Inhale 2 puffs into the lungs every 4 (four) hours as needed for wheezing or shortness of breath.   ferrous sulfate 325 (65 FE) MG tablet Take 325 mg by mouth once a week.   gabapentin (NEURONTIN) 300 MG capsule Take 1 capsule (300 mg total) by mouth at bedtime.   glimepiride (AMARYL) 2 MG tablet Take 1 tablet (2 mg total) by mouth 2 (two) times daily.   loratadine (CLARITIN) 10 MG tablet Take 1 tablet (10 mg total) by mouth daily.   losartan-hydrochlorothiazide (HYZAAR) 50-12.5 MG tablet Take 1 tablet by mouth daily.   Misc. Devices (WALKER) MISC 1 each by Does not apply route daily.   omeprazole (PRILOSEC) 20 MG capsule Take 1 capsule (20 mg total) by mouth daily.   Respiratory Therapy Supplies (NEBULIZER) DEVI 1 Device by Does not apply route 2 (two)  times daily as needed.   rosuvastatin (CRESTOR) 5 MG tablet Take 1 tablet (5 mg total) by mouth daily.   sitaGLIPtin (JANUVIA) 100 MG tablet Take 1 tablet (100 mg total) by mouth daily.   furosemide (LASIX) 20 MG tablet Take 1 tablet (20 mg total) by mouth daily as needed.   metFORMIN (GLUCOPHAGE) 1000 MG tablet Take 1 tablet (1,000 mg total) by mouth 2 (two) times daily. (Patient not taking: Reported on 07/26/2023)   No facility-administered medications prior to visit.    Review of Systems All negative Except see HPI       Objective    BP (!) 138/57   Pulse 66   Ht 5\' 4"  (1.626 m)   Wt 221 lb (100.2 kg)   SpO2 97%   BMI 37.93 kg/m     Physical Exam Vitals reviewed.  Constitutional:      General: She is not in acute distress.     Appearance: Normal appearance. She is well-developed. She is not diaphoretic.  HENT:     Head: Normocephalic and atraumatic.  Eyes:     General: No scleral icterus.    Conjunctiva/sclera: Conjunctivae normal.  Neck:     Thyroid: No thyromegaly.  Cardiovascular:     Rate and Rhythm: Normal rate and regular rhythm.     Pulses: Normal pulses.     Heart sounds: Normal heart sounds. No murmur heard. Pulmonary:     Effort: Pulmonary effort is normal. No respiratory distress.     Breath sounds: Normal breath sounds. No wheezing, rhonchi or rales.  Musculoskeletal:     Cervical back: Neck supple.     Right lower leg: No edema.     Left lower leg: No edema.  Lymphadenopathy:     Cervical: No cervical adenopathy.  Skin:    General: Skin is warm and dry.     Findings: No rash.  Neurological:     Mental Status: She is alert and oriented to person, place, and time. Mental status is at baseline.  Psychiatric:        Mood and Affect: Mood normal.        Behavior: Behavior normal.      No results found for any visits on 07/26/23.      Assessment and Plan    Type 2 Diabetes Mellitus Elevated fasting blood sugars (170-190s) despite taking Glipizide 2 and Januvia 100. Patient has discontinued Metformin due to unspecified reasons. Discussed the impact of diet, particularly sweets, on blood sugar control. -Resume Metformin 1000mg  daily with breakfast. -Encourage a diet low in processed sugars and high in fruits. -Plan to recheck A1c and lipids. Will follow-up  Memory Concerns Patient reported occasional forgetfulness. Discussed the possibility of a neurology referral for further evaluation. -Deferred neurology referral at this time per patient's preference. -Plan to reassess at next visit. Pt does not remember the topics discussed During the interview Her daughter helped to explain them to her again  Hypertension Blood pressure slightly elevated. No changes in medication or symptoms  reported. -Continue current antihypertensive regimen/lasix 20, hyzaar 50-12.5. -Plan to monitor blood pressure at next visit. Will follow-up  Hld Chronic  and unstable LDL 116 from 07/26/23 Continue rosuvastatin 5 Will follow-up  CKD Chronic She needs to drink 8-12 glasses of water every day, avoid NSAIDs, and have renal functions check avery 6-12 months to ensure stability.    will follow-up  Vaccinations Patient has received the flu vaccine. Discussed the potential benefits of the pneumonia, shingles,  and tetanus vaccines. -Deferred additional vaccinations at this time per patient's preference. -Plan to revisit vaccination discussion at next visit.  Follow-up in 6 weeks to reassess blood sugar control, memory concerns, and blood pressure. Will also discuss vaccination options at that time.        No orders of the defined types were placed in this encounter.   Return in about 6 weeks (around 09/06/2023) for chronic disease f/u.   The patient was advised to call back or seek an in-person evaluation if the symptoms worsen or if the condition fails to improve as anticipated.  I discussed the assessment and treatment plan with the patient. The patient was provided an opportunity to ask questions and all were answered. The patient agreed with the plan and demonstrated an understanding of the instructions.  I, Debera Lat, PA-C have reviewed all documentation for this visit. The documentation on 07/26/2023  for the exam, diagnosis, procedures, and orders are all accurate and complete.  Debera Lat, [facility], MMS [facility] [phone] (phone) [phone] (fax)  [facility]

## 2023-07-27 LAB — BASIC METABOLIC PANEL
BUN/Creatinine Ratio: 25 (ref 12–28)
BUN: 27 mg/dL (ref 8–27)
CO2: 22 mmol/L (ref 20–29)
Calcium: 10.3 mg/dL (ref 8.7–10.3)
Chloride: 106 mmol/L (ref 96–106)
Creatinine, Ser: 1.09 mg/dL — ABNORMAL HIGH (ref 0.57–1.00)
Glucose: 176 mg/dL — ABNORMAL HIGH (ref 70–99)
Potassium: 4.6 mmol/L (ref 3.5–5.2)
Sodium: 142 mmol/L (ref 134–144)
eGFR: 49 mL/min/{1.73_m2} — ABNORMAL LOW (ref >59)

## 2023-07-27 LAB — CBC WITH DIFFERENTIAL/PLATELET
Basophils Absolute: 0.1 10*3/uL (ref 0.0–0.2)
Basos: 1 %
EOS (ABSOLUTE): 0.1 10*3/uL (ref 0.0–0.4)
Eos: 1 %
Hematocrit: 35.4 % (ref 34.0–46.6)
Hemoglobin: 11.8 g/dL (ref 11.1–15.9)
Immature Grans (Abs): 0 10*3/uL (ref 0.0–0.1)
Immature Granulocytes: 0 %
Lymphocytes Absolute: 1.8 10*3/uL (ref 0.7–3.1)
Lymphs: 28 %
MCH: 31.2 pg (ref 26.6–33.0)
MCHC: 33.3 g/dL (ref 31.5–35.7)
MCV: 94 fL (ref 79–97)
Monocytes Absolute: 0.5 10*3/uL (ref 0.1–0.9)
Monocytes: 8 %
Neutrophils Absolute: 3.9 10*3/uL (ref 1.4–7.0)
Neutrophils: 62 %
Platelets: 206 10*3/uL (ref 150–450)
RBC: 3.78 x10E6/uL (ref 3.77–5.28)
RDW: 10.8 % — ABNORMAL LOW (ref 11.7–15.4)
WBC: 6.4 10*3/uL (ref 3.4–10.8)

## 2023-07-27 LAB — LIPID PANEL
Chol/HDL Ratio: 3.2 ratio (ref 0.0–4.4)
Cholesterol, Total: 206 mg/dL — ABNORMAL HIGH (ref 100–199)
HDL: 65 mg/dL (ref >39)
LDL Chol Calc (NIH): 116 mg/dL — ABNORMAL HIGH (ref 0–99)
Triglycerides: 141 mg/dL (ref 0–149)
VLDL Cholesterol Cal: 25 mg/dL (ref 5–40)

## 2023-07-28 DIAGNOSIS — E119 Type 2 diabetes mellitus without complications: Secondary | ICD-10-CM | POA: Insufficient documentation

## 2023-07-28 DIAGNOSIS — N1831 Chronic kidney disease, stage 3a: Secondary | ICD-10-CM | POA: Insufficient documentation

## 2023-07-29 NOTE — Progress Notes (Signed)
 All labs stable for her , except - elevated blood sugar. Continue taking metformin and amaryl, low carb diet. -decreased kidney function. she needs to drink 8-12 glasses of water every day, avoid NSAIDs, and have renal functions check avery 6-12 months to ensure stability. - elevated cholesterol. Advised to continue cholesterol medication and low cholesterol diet, activities as tolerated

## 2023-08-01 ENCOUNTER — Telehealth: Payer: Self-pay

## 2023-08-01 NOTE — Telephone Encounter (Signed)
-----   Message from [place] sent at 07/29/2023  8:07 AM EST ----- All labs stable for her , except - elevated blood sugar. Continue taking metformin and amaryl, low carb diet. -decreased kidney function. she needs to drink 8-12 glasses of water every day, avoid NSAIDs, and have renal functions check avery 6-12 months to ensure stability. - elevated cholesterol. Advised to continue cholesterol medication and low cholesterol diet, activities as tolerated

## 2023-08-23 ENCOUNTER — Other Ambulatory Visit: Payer: Self-pay | Admitting: Physician Assistant

## 2023-08-23 DIAGNOSIS — R079 Chest pain, unspecified: Secondary | ICD-10-CM

## 2023-08-26 NOTE — Telephone Encounter (Signed)
 Requested Prescriptions  Pending Prescriptions Disp Refills   metFORMIN (GLUCOPHAGE) 1000 MG tablet [Pharmacy Med Name: METFORMIN HCL 1000 MG TAB] 60 tablet 0    Sig: TAKE 1 TABLET BY MOUTH TWICE DAILY     Endocrinology:  Diabetes - Biguanides Failed - 08/26/2023  1:30 PM      Failed - Cr in normal range and within 360 days    Creat  Date Value Ref Range Status  04/11/2022 0.94 0.60 - 0.95 mg/dL Final   Creatinine, Ser  Date Value Ref Range Status  07/26/2023 1.09 (H) 0.57 - 1.00 mg/dL Final         Failed - eGFR in normal range and within 360 days    GFR, Est African American  Date Value Ref Range Status  11/23/2019 72 > OR = 60 mL/min/1.9m2 Final   GFR, Est Non African American  Date Value Ref Range Status  11/23/2019 62 > OR = 60 mL/min/1.40m2 Final   GFR, Estimated  Date Value Ref Range Status  11/16/2022 50 (L) >60 mL/min Final    Comment:    (NOTE) Calculated using the CKD-EPI Creatinine Equation (2021)    eGFR  Date Value Ref Range Status  07/26/2023 49 (L) >59 mL/min/1.73 Final         Failed - B12 Level in normal range and within 720 days    No results found for: "VITAMINB12"       Passed - HBA1C is between 0 and 7.9 and within 180 days    HbA1c POC (<> result, manual entry)  Date Value Ref Range Status  01/16/2022 4.7 4.0 - 5.6 % Final   Hgb A1c MFr Bld  Date Value Ref Range Status  03/20/2023 5.8 (H) 4.8 - 5.6 % Final    Comment:             Prediabetes: 5.7 - 6.4          Diabetes: >6.4          Glycemic control for adults with diabetes: <7.0          Passed - Valid encounter within last 6 months    Recent Outpatient Visits           1 month ago Type 2 diabetes mellitus without complication, without long-term current use of insulin (HCC)   [place] [facility] [place], [place], PA-C       Future Appointments             In 2 weeks [place], [place], PA-C [facility], [facility]            Passed -  CBC within normal limits and completed in the last 12 months    WBC  Date Value Ref Range Status  07/26/2023 6.4 3.4 - 10.8 x10E3/uL Final  11/16/2022 4.5 4.0 - 10.5 K/uL Final   RBC  Date Value Ref Range Status  07/26/2023 3.78 3.77 - 5.28 x10E6/uL Final  11/16/2022 4.04 3.87 - 5.11 MIL/uL Final   Hemoglobin  Date Value Ref Range Status  07/26/2023 11.8 11.1 - 15.9 g/dL Final   Hematocrit  Date Value Ref Range Status  07/26/2023 35.4 34.0 - 46.6 % Final   MCHC  Date Value Ref Range Status  07/26/2023 33.3 31.5 - 35.7 g/dL Final  16/02/9603 54.0 30.0 - 36.0 g/dL Final   [facility]  Date Value Ref Range Status  07/26/2023 31.2 26.6 - 33.0 pg Final  11/16/2022 30.4 26.0 - 34.0 pg  Final   MCV  Date Value Ref Range Status  07/26/2023 94 79 - 97 fL Final   No results found for: "PLTCOUNTKUC", "LABPLAT", "POCPLA" RDW  Date Value Ref Range Status  07/26/2023 10.8 (L) 11.7 - 15.4 % Final          omeprazole (PRILOSEC) 20 MG capsule [Pharmacy Med Name: OMEPRAZOLE DR 20 MG CAP] 30 capsule 3    Sig: TAKE 1 CAPSULE BY MOUTH ONCE DAILY     Gastroenterology: Proton Pump Inhibitors Passed - 08/26/2023  1:30 PM      Passed - Valid encounter within last 12 months    Recent Outpatient Visits           1 month ago Type 2 diabetes mellitus without complication, without long-term current use of insulin (HCC)   [facility] [place], [place], PA-C       Future Appointments             In 2 weeks Debera Lat, PA-C [facility], [facility]

## 2023-09-09 ENCOUNTER — Ambulatory Visit: Payer: 59 | Admitting: Physician Assistant

## 2023-09-09 ENCOUNTER — Encounter: Payer: Self-pay | Admitting: Physician Assistant

## 2023-09-09 VITALS — BP 129/69 | Resp 16 | Ht 61.0 in | Wt 215.0 lb

## 2023-09-09 DIAGNOSIS — E1169 Type 2 diabetes mellitus with other specified complication: Secondary | ICD-10-CM

## 2023-09-09 DIAGNOSIS — I499 Cardiac arrhythmia, unspecified: Secondary | ICD-10-CM

## 2023-09-09 DIAGNOSIS — R413 Other amnesia: Secondary | ICD-10-CM | POA: Diagnosis not present

## 2023-09-09 DIAGNOSIS — E119 Type 2 diabetes mellitus without complications: Secondary | ICD-10-CM

## 2023-09-09 DIAGNOSIS — N1831 Chronic kidney disease, stage 3a: Secondary | ICD-10-CM

## 2023-09-09 DIAGNOSIS — I152 Hypertension secondary to endocrine disorders: Secondary | ICD-10-CM

## 2023-09-09 DIAGNOSIS — R0789 Other chest pain: Secondary | ICD-10-CM | POA: Diagnosis not present

## 2023-09-09 DIAGNOSIS — R0602 Shortness of breath: Secondary | ICD-10-CM

## 2023-09-09 DIAGNOSIS — E785 Hyperlipidemia, unspecified: Secondary | ICD-10-CM | POA: Diagnosis not present

## 2023-09-09 DIAGNOSIS — M25473 Effusion, unspecified ankle: Secondary | ICD-10-CM | POA: Diagnosis not present

## 2023-09-09 DIAGNOSIS — E7849 Other hyperlipidemia: Secondary | ICD-10-CM

## 2023-09-09 DIAGNOSIS — E1159 Type 2 diabetes mellitus with other circulatory complications: Secondary | ICD-10-CM | POA: Diagnosis not present

## 2023-09-09 DIAGNOSIS — Z7984 Long term (current) use of oral hypoglycemic drugs: Secondary | ICD-10-CM | POA: Diagnosis not present

## 2023-09-09 NOTE — Progress Notes (Unsigned)
 Established patient visit  Patient: Vanessa Vazquez   DOB: [birth year]   89 y.o. Female  MRN: [id number] Visit Date: 09/09/2023  Today's healthcare provider: Debera Lat, PA-C   Chief Complaint  Patient presents with   Follow-up    6 wk f/u and refill   Subjective      Discussed the use of AI scribe software for clinical note transcription with the patient, who gave verbal consent to proceed.  History of Present Illness The patient, with a history of diabetes, hypertension, and hyperlipidemia, presents for a follow-up visit. The patient admits to not consistently checking her blood sugar levels, but reports a reading of 111 mg/dL that morning. She is on three medications for diabetes: metformin, glipizide, and Januvia.  The patient's blood pressure is reportedly well-controlled, but there is confusion about the medications she is taking for hypertension. The patient is believed to be on Lasix and Hyzaar, but the patient and her daughter are unsure. The patient admits to only taking the Lasix when she feels swollen.  The patient also reports recent episodes of shortness of breath and chest pain, which are new symptoms. She is not sure of the cause but mentions that these symptoms have been occurring for the past week.  The patient is also on medication for hyperlipidemia (rosuvastatin) and GERD (Prilosec). There is confusion about a medication (Hazar) that the patient believes she has not been taking for the past month due to a misunderstanding about a refill. Per review of lab results from  07/26/23 - elevated blood sugar. Pt was advised to Continue taking metformin and amaryl, low carb diet. -decreased kidney function. she was advised to drink 8-12 glasses of water every day, avoid NSAIDs, and have renal functions check avery 6-12 months to ensure stability.   - elevated cholesterol. Advised to continue cholesterol medication and low cholesterol diet, activities as tolerated      07/26/2023   10:16 AM 01/15/2023    1:10 PM 01/01/2023    9:33 AM  Depression screen PHQ 2/9  Decreased Interest 0 0 0  Down, Depressed, Hopeless 0 0 0  PHQ - 2 Score 0 0 0  Altered sleeping 0    Tired, decreased energy 1    Change in appetite 0    Feeling bad or failure about yourself  0    Trouble concentrating 0    Moving slowly or fidgety/restless 0    Suicidal thoughts 0    PHQ-9 Score 1    Difficult doing work/chores Not difficult at all        07/26/2023   10:16 AM  GAD 7 : Generalized Anxiety Score  Nervous, Anxious, on Edge 0  Control/stop worrying 0  Worry too much - different things 0  Trouble relaxing 0  Restless 0  Easily annoyed or irritable 0  Afraid - awful might happen 0  Total GAD 7 Score 0  Anxiety Difficulty Not difficult at all    Medications: Outpatient Medications Prior to Visit  Medication Sig   albuterol (VENTOLIN HFA) 108 (90 Base) MCG/ACT inhaler Inhale 2 puffs into the lungs every 4 (four) hours as needed for wheezing or shortness of breath.   ferrous sulfate 325 (65 FE) MG tablet Take 325 mg by mouth once a week.   gabapentin (NEURONTIN) 300 MG capsule Take 1 capsule (300 mg total) by mouth at bedtime.   glimepiride (AMARYL) 2 MG tablet Take 1 tablet (2 mg total) by mouth 2 (two) times daily.  loratadine (CLARITIN) 10 MG tablet Take 1 tablet (10 mg total) by mouth daily.   losartan-hydrochlorothiazide (HYZAAR) 50-12.5 MG tablet Take 1 tablet by mouth daily.   metFORMIN (GLUCOPHAGE) 1000 MG tablet TAKE 1 TABLET BY MOUTH TWICE DAILY   Misc. Devices (WALKER) MISC 1 each by Does not apply route daily.   omeprazole (PRILOSEC) 20 MG capsule TAKE 1 CAPSULE BY MOUTH ONCE DAILY   Respiratory Therapy Supplies (NEBULIZER) DEVI 1 Device by Does not apply route 2 (two) times daily as needed.   rosuvastatin (CRESTOR) 5 MG tablet Take 1 tablet (5 mg total) by mouth daily.   sitaGLIPtin (JANUVIA) 100 MG tablet Take 1 tablet (100 mg total) by mouth daily.    furosemide (LASIX) 20 MG tablet Take 1 tablet (20 mg total) by mouth daily as needed.   No facility-administered medications prior to visit.    Review of Systems All negative Except see HPI       Objective    BP 129/69 (BP Location: Left Arm, Patient Position: Sitting, Cuff Size: Normal)   Resp 16   Ht 5\' 1"  (1.549 m)   Wt 215 lb (97.5 kg)   SpO2 98%   BMI 40.62 kg/m     Physical Exam Vitals reviewed.  Constitutional:      General: She is not in acute distress.    Appearance: Normal appearance. She is well-developed. She is not diaphoretic.  HENT:     Head: Normocephalic and atraumatic.  Eyes:     General: No scleral icterus.    Conjunctiva/sclera: Conjunctivae normal.  Neck:     Thyroid: No thyromegaly.  Cardiovascular:     Rate and Rhythm: Normal rate and regular rhythm.     Pulses: Normal pulses.     Heart sounds: Normal heart sounds. No murmur heard. Pulmonary:     Effort: Pulmonary effort is normal. No respiratory distress.     Breath sounds: Normal breath sounds. No wheezing, rhonchi or rales.  Musculoskeletal:     Cervical back: Neck supple.     Right lower leg: No edema.     Left lower leg: No edema.  Lymphadenopathy:     Cervical: No cervical adenopathy.  Skin:    General: Skin is warm and dry.     Findings: No rash.  Neurological:     Mental Status: She is alert and oriented to person, place, and time. Mental status is at baseline.  Psychiatric:        Mood and Affect: Mood normal.        Behavior: Behavior normal.      No results found for any visits on 09/09/23.      Assessment & Plan Chest Pain Intermittent chest pain and shortness of breath. Differential includes cardiac or pulmonary causes. Further evaluation needed. - Order chest x-ray. - Order additional labs to assess cardiac function.  Diabetes Mellitus Type 2 Inconsistent home glucose monitoring. Current medications: metformin, glipizide, Januvia. A1c testing due every three  months. - Schedule A1c test for next visit.  Hypertension Irregular home blood pressure monitoring. Current medication: Lasix. Losartan and hydrochlorothiazide not taken for a month. Blood pressure appears controlled but consistent medication use is a concern. - Instruct her to monitor blood pressure at home and document readings. - Contact cardiologist to refill losartan and hydrochlorothiazide prescription. - Consider referral to care management for assistance with medication management. Will follow-up  Hyperlipidemia Chronic  Managed with rosuvastatin. No issues with adherence. Will follow-up  Gastroesophageal Reflux Disease (  GERD) Chronic Managed with Prilosec. No issues with adherence. Will follow-up  Type 2 diabetes mellitus without complication, without long-term current use of insulin (HCC) (Primary) - Comprehensive metabolic panel with GFR - CBC with Differential/Platelet - Lipid panel  Memory problem Pt denies having any problems with memory. Claims that all her current memory lapses could be related to her age Declined any testing  Hypertension associated with diabetes (HCC) - Comprehensive metabolic panel with GFR - CBC with Differential/Platelet - Lipid panel  Other chest pain - D-Dimer, Quantitative - Pro b natriuretic peptide (BNP)[facility] [facility]) - Troponin T  SOB (shortness of breath) - D-Dimer, Quantitative - Pro b natriuretic peptide (BNP)[facility] [facility]) - Troponin T  Ankle swelling, unspecified laterality - D-Dimer, Quantitative - Pro b natriuretic peptide (BNP)[facility] [facility]) - Troponin T Irregular heart rhythm - EKG 12-Lead Cannot complete the testing due to tech problem with EKG machine If symptoms persist pt agreed to proceed to go to ER Follow-up Pending follow-up with cardiologist. Needs to contact cardiologist for refills and appointment scheduling. Care management assistance  offered. - Contact cardiologist for appointment scheduling and prescription refills. - Schedule follow-up visit after lab results are available.    Orders Placed This Encounter  Procedures   Comprehensive metabolic panel with GFR    Has the patient fasted?:   Yes   CBC with Differential/Platelet   Lipid panel    Has the patient fasted?:   Yes   D-Dimer, Quantitative   Pro b natriuretic peptide (BNP)[facility] [facility])   Troponin T   EKG 12-Lead    Return in about 4 weeks (around 10/07/2023) for chronic disease f/u, BP f/u.   The patient was advised to call back or seek an in-person evaluation if the symptoms worsen or if the condition fails to improve as anticipated.  I discussed the assessment and treatment plan with the patient. The patient was provided an opportunity to ask questions and all were answered. The patient agreed with the plan and demonstrated an understanding of the instructions.  I, [place], PA-C have reviewed all documentation for this visit. The documentation on 09/09/2023  for the exam, diagnosis, procedures, and orders are all accurate and complete.  Blane Bunting, [facility], MMS [facility] [phone] (phone) [phone] (fax)  [facility]

## 2023-09-10 LAB — PRO B NATRIURETIC PEPTIDE: NT-Pro BNP: 82 pg/mL (ref 0–738)

## 2023-09-10 LAB — COMPREHENSIVE METABOLIC PANEL WITH GFR
ALT: 15 IU/L (ref 0–32)
AST: 16 IU/L (ref 0–40)
Albumin: 4.3 g/dL (ref 3.7–4.7)
Alkaline Phosphatase: 86 IU/L (ref 44–121)
BUN/Creatinine Ratio: 22 (ref 12–28)
BUN: 22 mg/dL (ref 8–27)
Bilirubin Total: 0.4 mg/dL (ref 0.0–1.2)
CO2: 21 mmol/L (ref 20–29)
Calcium: 10.4 mg/dL — ABNORMAL HIGH (ref 8.7–10.3)
Chloride: 105 mmol/L (ref 96–106)
Creatinine, Ser: 1.01 mg/dL — ABNORMAL HIGH (ref 0.57–1.00)
Globulin, Total: 2.7 g/dL (ref 1.5–4.5)
Glucose: 75 mg/dL (ref 70–99)
Potassium: 4.7 mmol/L (ref 3.5–5.2)
Sodium: 142 mmol/L (ref 134–144)
Total Protein: 7 g/dL (ref 6.0–8.5)
eGFR: 54 mL/min/{1.73_m2} — ABNORMAL LOW (ref >59)

## 2023-09-10 LAB — CBC WITH DIFFERENTIAL/PLATELET
Basophils Absolute: 0.1 x10E3/uL (ref 0.0–0.2)
Basos: 1 %
EOS (ABSOLUTE): 0.1 x10E3/uL (ref 0.0–0.4)
Eos: 2 %
Hematocrit: 38.1 % (ref 34.0–46.6)
Hemoglobin: 12.3 g/dL (ref 11.1–15.9)
Immature Grans (Abs): 0 x10E3/uL (ref 0.0–0.1)
Immature Granulocytes: 0 %
Lymphocytes Absolute: 2.1 x10E3/uL (ref 0.7–3.1)
Lymphs: 39 %
MCH: 31 pg (ref 26.6–33.0)
MCHC: 32.3 g/dL (ref 31.5–35.7)
MCV: 96 fL (ref 79–97)
Monocytes Absolute: 0.5 x10E3/uL (ref 0.1–0.9)
Monocytes: 8 %
Neutrophils Absolute: 2.8 x10E3/uL (ref 1.4–7.0)
Neutrophils: 50 %
Platelets: 246 x10E3/uL (ref 150–450)
RBC: 3.97 x10E6/uL (ref 3.77–5.28)
RDW: 11.4 % — ABNORMAL LOW (ref 11.7–15.4)
WBC: 5.5 x10E3/uL (ref 3.4–10.8)

## 2023-09-10 LAB — LIPID PANEL
Chol/HDL Ratio: 2.7 ratio (ref 0.0–4.4)
Cholesterol, Total: 187 mg/dL (ref 100–199)
HDL: 69 mg/dL (ref >39)
LDL Chol Calc (NIH): 100 mg/dL — ABNORMAL HIGH (ref 0–99)
Triglycerides: 102 mg/dL (ref 0–149)
VLDL Cholesterol Cal: 18 mg/dL (ref 5–40)

## 2023-09-10 LAB — D-DIMER, QUANTITATIVE: D-DIMER: 1.37 mg{FEU}/L — ABNORMAL HIGH (ref 0.00–0.49)

## 2023-09-10 LAB — TROPONIN T: Troponin T (Highly Sensitive): 6 ng/L (ref 0–14)

## 2023-09-17 ENCOUNTER — Telehealth: Payer: Self-pay

## 2023-09-17 ENCOUNTER — Other Ambulatory Visit: Payer: Self-pay

## 2023-09-17 NOTE — Telephone Encounter (Signed)
 Patient returned Monica's call, please f/u with patient for results

## 2023-09-17 NOTE — Telephone Encounter (Signed)
 Vanessa Vazquez

## 2023-09-27 ENCOUNTER — Ambulatory Visit (INDEPENDENT_AMBULATORY_CARE_PROVIDER_SITE_OTHER): Payer: 59 | Admitting: Podiatry

## 2023-09-27 ENCOUNTER — Encounter: Payer: Self-pay | Admitting: Podiatry

## 2023-09-27 VITALS — Ht 61.0 in | Wt 215.0 lb

## 2023-09-27 DIAGNOSIS — M79675 Pain in left toe(s): Secondary | ICD-10-CM | POA: Diagnosis not present

## 2023-09-27 DIAGNOSIS — B351 Tinea unguium: Secondary | ICD-10-CM

## 2023-09-27 DIAGNOSIS — M79674 Pain in right toe(s): Secondary | ICD-10-CM | POA: Diagnosis not present

## 2023-09-27 DIAGNOSIS — E0843 Diabetes mellitus due to underlying condition with diabetic autonomic (poly)neuropathy: Secondary | ICD-10-CM

## 2023-09-27 NOTE — Patient Instructions (Signed)
Purchase Nervive Pain Cream or Roll On. Apply to feet before bedtime. Can be purchased at your local drug store over the counter.  

## 2023-09-30 ENCOUNTER — Other Ambulatory Visit: Payer: Self-pay | Admitting: Physician Assistant

## 2023-10-04 NOTE — Progress Notes (Signed)
  Subjective:  Patient ID: Vanessa Vazquez, female    DOB: 1934/12/30,  MRN: [id number]  89 y.o. female presents at risk foot care with history of diabetic neuropathy and painful elongated mycotic toenails 1-5 bilaterally which are tender when wearing enclosed shoe gear. Pain is relieved with periodic professional debridement. Patient states she has numbness in her left foot. Chief Complaint  Patient presents with   Nail Problem    Pt is here for [facility] unsure of last A1C PCP is Dr Merlyn Starring and LOV was in April.   New problem(s): None   PCP is Ostwalt, Janna, PA-C.  No Known Allergies  Review of Systems: Negative except as noted in the HPI.   Objective:  Vanessa Vazquez is a pleasant 89 y.o. female in NAD. AAO x 3.  Vascular Examination: Vascular status intact b/l with palpable pedal pulses. CFT immediate b/l. Pedal hair present. Trace edema. No pain with calf compression b/l. Skin temperature gradient WNL b/l. No varicosities noted. No cyanosis or clubbing noted.  Neurological Examination: Sensation grossly intact b/l with 10 gram monofilament. Vibratory sensation intact b/l. Pt has subjective symptoms of neuropathy.  Dermatological Examination: Pedal skin with normal turgor, texture and tone b/l. No open wounds nor interdigital macerations noted. Toenails 1-5 b/l thick, discolored, elongated with subungual debris and pain on dorsal palpation. No hyperkeratotic lesions noted b/l.   Musculoskeletal Examination: Muscle strength 5/5 to b/l LE.  No pain, crepitus noted b/l. No gross pedal deformities. Patient ambulates independently without assistive aids.   Radiographs: None  Last A1c:      Latest Ref Rng & Units 03/20/2023    9:34 AM 10/23/2022    4:26 PM  Hemoglobin A1C  Hemoglobin-A1c 4.8 - 5.6 % 5.8  6.0      Assessment:   1. Pain due to onychomycosis of toenails of both feet   2. Diabetes mellitus due to underlying condition with diabetic autonomic neuropathy, unspecified whether  long term insulin  use (HCC)     Plan:  Consent given for treatment. Patient examined. All patient's and/or POA's questions/concerns addressed on today's visit. -Discussed neuropathy symptoms. Advised patient/POA to purchase Nervive Pain Cream or Roll On. Apply to foot/feet before bedtime. Mycotic toenails 1-5 debrided in length and girth without incident. Continue foot and shoe inspections daily. Monitor blood glucose per PCP/Endocrinologist's recommendations.Continue soft, supportive shoe gear daily. Report any pedal injuries to medical professional. Call office if there are any quesitons/concerns.  Return in about 3 months (around 12/28/2023).  Luella Sager, DPM      [facility] LOCATION: [address] [place] [address]                   Office [phone]   [facility] LOCATION: [address] [place], [place] [address] Office [phone]

## 2023-10-11 ENCOUNTER — Ambulatory Visit: Admitting: Physician Assistant

## 2023-10-11 ENCOUNTER — Encounter: Payer: Self-pay | Admitting: Physician Assistant

## 2023-10-11 VITALS — BP 119/73 | HR 73 | Resp 16 | Ht 61.0 in | Wt 212.0 lb

## 2023-10-11 DIAGNOSIS — R7989 Other specified abnormal findings of blood chemistry: Secondary | ICD-10-CM | POA: Diagnosis not present

## 2023-10-11 DIAGNOSIS — M7989 Other specified soft tissue disorders: Secondary | ICD-10-CM

## 2023-10-11 DIAGNOSIS — I152 Hypertension secondary to endocrine disorders: Secondary | ICD-10-CM

## 2023-10-11 DIAGNOSIS — N1831 Chronic kidney disease, stage 3a: Secondary | ICD-10-CM | POA: Diagnosis not present

## 2023-10-11 DIAGNOSIS — E119 Type 2 diabetes mellitus without complications: Secondary | ICD-10-CM

## 2023-10-11 DIAGNOSIS — E1169 Type 2 diabetes mellitus with other specified complication: Secondary | ICD-10-CM

## 2023-10-11 DIAGNOSIS — E785 Hyperlipidemia, unspecified: Secondary | ICD-10-CM

## 2023-10-11 DIAGNOSIS — R5383 Other fatigue: Secondary | ICD-10-CM | POA: Diagnosis not present

## 2023-10-11 DIAGNOSIS — E1159 Type 2 diabetes mellitus with other circulatory complications: Secondary | ICD-10-CM | POA: Diagnosis not present

## 2023-10-11 DIAGNOSIS — R0602 Shortness of breath: Secondary | ICD-10-CM

## 2023-10-11 NOTE — Progress Notes (Signed)
 Established patient visit  Patient: Vanessa Vazquez   DOB: May 01, 1935   89 y.o. Female  MRN: [id number] Visit Date: 10/11/2023  Today's healthcare provider: Blane Bunting, PA-C   Chief Complaint  Patient presents with   Hypertension    F'u on  BP no other concerns   Subjective       Discussed the use of AI scribe software for clinical note transcription with the patient, who gave verbal consent to proceed.  History of Present Illness SHALECE Vazquez is an 89 year old female with diabetes and hypertension who presents with fatigue and lack of energy.  She experiences fatigue and lack of energy, especially in the mornings. She denies chest pain or shortness of breath for today but endorses having chest pain intermittently and shortness of breath with exertion. Her diabetes is managed with metformin , glipizide, and Januvia . She takes losartan  and hydrochlorothiazide  for hypertension. She is unsure about her current use of Lasix  but reports stable leg swelling. She takes rosuvastatin  for cholesterol. She has not taken Prilosec for a week due to a lack of refills.  She was advised to increase water intake due to decreased kidney function.  Pt was advised to see her cardiology for medication refills     10/11/2023    8:38 AM 09/09/2023   10:10 AM 07/26/2023   10:16 AM  Depression screen PHQ 2/9  Decreased Interest 3 0 0  Down, Depressed, Hopeless 0 0 0  PHQ - 2 Score 3 0 0  Altered sleeping 0 0 0  Tired, decreased energy 2 1 1   Change in appetite 0 0 0  Feeling bad or failure about yourself  0 0 0  Trouble concentrating 0 0 0  Moving slowly or fidgety/restless 0 0 0  Suicidal thoughts 0 0 0  PHQ-9 Score 5 1 1   Difficult doing work/chores Not difficult at all Not difficult at all Not difficult at all      10/11/2023    8:38 AM 09/09/2023   10:10 AM 07/26/2023   10:16 AM  GAD 7 : Generalized Anxiety Score  Nervous, Anxious, on Edge 0 0 0  Control/stop worrying 0 0 0  Worry too much  - different things 0 0 0  Trouble relaxing 0 0 0  Restless 0 0 0  Easily annoyed or irritable 0 0 0  Afraid - awful might happen 0 0 0  Total GAD 7 Score 0 0 0  Anxiety Difficulty Not difficult at all Not difficult at all Not difficult at all    Medications: Outpatient Medications Prior to Visit  Medication Sig   albuterol  (VENTOLIN  HFA) 108 (90 Base) MCG/ACT inhaler Inhale 2 puffs into the lungs every 4 (four) hours as needed for wheezing or shortness of breath.   ferrous sulfate  325 (65 FE) MG tablet Take 325 mg by mouth once a week.   gabapentin  (NEURONTIN ) 300 MG capsule Take 1 capsule (300 mg total) by mouth at bedtime.   glimepiride  (AMARYL ) 2 MG tablet Take 1 tablet (2 mg total) by mouth 2 (two) times daily.   loratadine  (CLARITIN ) 10 MG tablet Take 1 tablet (10 mg total) by mouth daily.   losartan -hydrochlorothiazide  (HYZAAR) 50-12.5 MG tablet Take 1 tablet by mouth daily.   metFORMIN  (GLUCOPHAGE ) 1000 MG tablet TAKE 1 TABLET BY MOUTH TWICE DAILY   Misc. Devices (WALKER) MISC 1 each by Does not apply route daily.   omeprazole  (PRILOSEC) 20 MG capsule TAKE 1 CAPSULE BY MOUTH ONCE DAILY  Respiratory Therapy Supplies (NEBULIZER) DEVI 1 Device by Does not apply route 2 (two) times daily as needed.   rosuvastatin  (CRESTOR ) 5 MG tablet Take 1 tablet (5 mg total) by mouth daily.   sitaGLIPtin  (JANUVIA ) 100 MG tablet Take 1 tablet (100 mg total) by mouth daily.   furosemide  (LASIX ) 20 MG tablet Take 1 tablet (20 mg total) by mouth daily as needed.   No facility-administered medications prior to visit.    Review of Systems All negative Except see HPI       Objective    BP 119/73 (BP Location: Right Arm, Patient Position: Sitting)   Pulse 73   Resp 16   Ht 5\' 1"  (1.549 m)   Wt 212 lb (96.2 kg)   SpO2 95%   BMI 40.06 kg/m     Physical Exam Vitals reviewed.  Constitutional:      General: She is not in acute distress.    Appearance: Normal appearance. She is  well-developed. She is not diaphoretic.  HENT:     Head: Normocephalic and atraumatic.  Eyes:     General: No scleral icterus.    Conjunctiva/sclera: Conjunctivae normal.  Neck:     Thyroid : No thyromegaly.  Cardiovascular:     Rate and Rhythm: Normal rate and regular rhythm.     Pulses: Normal pulses.     Heart sounds: Normal heart sounds. No murmur heard. Pulmonary:     Effort: Pulmonary effort is normal. No respiratory distress.     Breath sounds: Normal breath sounds. No wheezing, rhonchi or rales.  Musculoskeletal:     Cervical back: Neck supple.     Right lower leg: No edema.     Left lower leg: No edema.  Lymphadenopathy:     Cervical: No cervical adenopathy.  Skin:    General: Skin is warm and dry.     Findings: No rash.  Neurological:     Mental Status: She is alert and oriented to person, place, and time. Mental status is at baseline.  Psychiatric:        Mood and Affect: Mood normal.        Behavior: Behavior normal.     No results found for any visits on 10/11/23.      Assessment & Plan Fatigue Chronic? Differential includes cardiac issues due to elevated D-dimer. Normal troponin levels ruled out acute myocardial infarction. - Order EKG to assess cardiac function. Pt prefers to contact her cardiology for further evaluation and refuses to proceed with EKG. - Perform additional blood work to investigate causes of fatigue. Advised to schedule a follow-up with cardiology sooner than in June  Chronic kidney disease Chronic kidney disease with decreased kidney function. Advised on hydration to prevent further issues. - Encourage adequate water intake to prevent kidney problems. But monitor for volume overload  Hypertension Chronic Managed with losartan  50, hydrochlorothiazide  12.5 Pt is confused what medication she has been taking.  Advised to bring her medications with her for every visit and recheck with CMA at the beginning every visit Fluid retention and  leg swelling under control. Continue with low salt diet and regular exercise Will follow-up  Type 2 diabetes mellitus Chronic Well-controlled with fasting glucose 80-130 mg/dL. Medications include metformin  1000 bid, glimepiride  2mg  bid , and Januvia  100. Continue lifestyle modifications'  Hyperlipidemia Chronic On rosuvastatin  5mg  with dietary modifications advised for cholesterol management. - Encourage dietary modifications including lean meats and avoiding fried foods.  Other fatigue Morbid obesity Chronic Associated with htn  hyperlipidemia, diabetes Weight loss of 5% of pt's current weight via healthy diet and daily exercise encouraged. ordered - Comprehensive metabolic panel with GFR - D-dimer, quantitative - Vitamin B12 - VITAMIN D  25 Hydroxy (Vit-D Deficiency, Fractures) - CT Angio Chest Pulmonary Embolism (PE) W or WO Contrast; Future Will reassess after  receiving lab results Will FU   Stage 3a chronic kidney disease (HCC) -cmp  SOB (shortness of breath) - CT Angio Chest Pulmonary Embolism (PE) W or WO Contrast; Future  Left leg swelling With SOB - US  Venous Img Lower Bilateral (DVT); Future   Right leg swelling With SOB - US  Venous Img Lower Bilateral (DVT); Future  Positive D dimer - CT Angio Chest Pulmonary Embolism (PE) W or WO Contrast; Future  No orders of the defined types were placed in this encounter.   No follow-ups on file.   The patient was advised to call back or seek an in-person evaluation if the symptoms worsen or if the condition fails to improve as anticipated.  I discussed the assessment and treatment plan with the patient. The patient was provided an opportunity to ask questions and all were answered. The patient agreed with the plan and demonstrated an understanding of the instructions.  I, Marguerette Sheller, PA-C have reviewed all documentation for this visit. The documentation on 10/11/2023  for the exam, diagnosis, procedures, and  orders are all accurate and complete.  Blane Bunting, [facility], MMS [facility] [phone] (phone) [phone] (fax)  [facility]

## 2023-10-12 LAB — COMPREHENSIVE METABOLIC PANEL WITH GFR
ALT: 10 IU/L (ref 0–32)
AST: 15 IU/L (ref 0–40)
Albumin: 4.1 g/dL (ref 3.7–4.7)
Alkaline Phosphatase: 87 IU/L (ref 44–121)
BUN/Creatinine Ratio: 21 (ref 12–28)
BUN: 21 mg/dL (ref 8–27)
Bilirubin Total: 0.5 mg/dL (ref 0.0–1.2)
CO2: 23 mmol/L (ref 20–29)
Calcium: 10.5 mg/dL — ABNORMAL HIGH (ref 8.7–10.3)
Chloride: 105 mmol/L (ref 96–106)
Creatinine, Ser: 1.01 mg/dL — ABNORMAL HIGH (ref 0.57–1.00)
Globulin, Total: 2.6 g/dL (ref 1.5–4.5)
Glucose: 111 mg/dL — ABNORMAL HIGH (ref 70–99)
Potassium: 4.3 mmol/L (ref 3.5–5.2)
Sodium: 142 mmol/L (ref 134–144)
Total Protein: 6.7 g/dL (ref 6.0–8.5)
eGFR: 54 mL/min/{1.73_m2} — ABNORMAL LOW (ref >59)

## 2023-10-12 LAB — VITAMIN D 25 HYDROXY (VIT D DEFICIENCY, FRACTURES): Vit D, 25-Hydroxy: 22.5 ng/mL — ABNORMAL LOW (ref 30.0–100.0)

## 2023-10-12 LAB — D-DIMER, QUANTITATIVE: D-DIMER: 1.44 mg{FEU}/L — ABNORMAL HIGH (ref 0.00–0.49)

## 2023-10-12 LAB — VITAMIN B12: Vitamin B-12: 158 pg/mL — ABNORMAL LOW (ref 232–1245)

## 2023-10-14 ENCOUNTER — Ambulatory Visit: Payer: Self-pay | Admitting: Physician Assistant

## 2023-10-14 DIAGNOSIS — E079 Disorder of thyroid, unspecified: Secondary | ICD-10-CM

## 2023-10-16 ENCOUNTER — Ambulatory Visit
Admission: RE | Admit: 2023-10-16 | Discharge: 2023-10-16 | Disposition: A | Discharge status: Home / Self Care | Source: Ambulatory Visit | Attending: Physician Assistant | Admitting: Physician Assistant

## 2023-10-16 DIAGNOSIS — R5383 Other fatigue: Secondary | ICD-10-CM | POA: Insufficient documentation

## 2023-10-16 DIAGNOSIS — M79604 Pain in right leg: Secondary | ICD-10-CM | POA: Diagnosis not present

## 2023-10-16 DIAGNOSIS — I517 Cardiomegaly: Secondary | ICD-10-CM | POA: Diagnosis not present

## 2023-10-16 DIAGNOSIS — E041 Nontoxic single thyroid nodule: Secondary | ICD-10-CM | POA: Diagnosis not present

## 2023-10-16 DIAGNOSIS — M79605 Pain in left leg: Secondary | ICD-10-CM | POA: Diagnosis not present

## 2023-10-16 DIAGNOSIS — R7989 Other specified abnormal findings of blood chemistry: Secondary | ICD-10-CM | POA: Insufficient documentation

## 2023-10-16 DIAGNOSIS — M7989 Other specified soft tissue disorders: Secondary | ICD-10-CM

## 2023-10-16 DIAGNOSIS — I700 Atherosclerosis of aorta: Secondary | ICD-10-CM | POA: Diagnosis not present

## 2023-10-16 DIAGNOSIS — R0602 Shortness of breath: Secondary | ICD-10-CM | POA: Insufficient documentation

## 2023-10-16 MED ORDER — IOHEXOL 350 MG/ML SOLN
75.0000 mL | Freq: Once | INTRAVENOUS | Status: AC | PRN
  Administered 2023-10-16: 75 mL via INTRAVENOUS

## 2023-10-17 DIAGNOSIS — R7989 Other specified abnormal findings of blood chemistry: Secondary | ICD-10-CM | POA: Insufficient documentation

## 2023-10-17 DIAGNOSIS — E1169 Type 2 diabetes mellitus with other specified complication: Secondary | ICD-10-CM | POA: Insufficient documentation

## 2023-10-17 DIAGNOSIS — M7989 Other specified soft tissue disorders: Secondary | ICD-10-CM | POA: Insufficient documentation

## 2023-10-17 DIAGNOSIS — R0602 Shortness of breath: Secondary | ICD-10-CM | POA: Insufficient documentation

## 2023-10-17 NOTE — Progress Notes (Signed)
 US  lower leg were negative for DVT

## 2023-10-17 NOTE — Progress Notes (Signed)
 All stable for you except -Decrease in kidney function.  Advised drinking enough water daily but please, watch for leg swelling, avoiding NSAIDs/ibuprofen, Aleve We will recheck again at the follow-up -Low vitamin B12.Please start taking over-the-counter vitamin B12 1000 mcg  to 2000 mcg orally for 1 to 2 weeks, followed by 500 to 1000 mcg daily for maintenance.daily -Low vitamin D .  Advised to take over-the-counter vitamin D3 up to 2000 units

## 2023-10-18 NOTE — Progress Notes (Signed)
 CT finding are inconclusive for PE but suggesting pulmonary arterial hypertension. Please, follow-up with your cardiology.  If symptoms worsen please proceed to emergency department for prompt evaluation.  There is 4 cm right thyroid  mass.  I will place order for ultrasound

## 2023-10-29 ENCOUNTER — Other Ambulatory Visit: Payer: Self-pay | Admitting: Physician Assistant

## 2023-10-29 DIAGNOSIS — E118 Type 2 diabetes mellitus with unspecified complications: Secondary | ICD-10-CM

## 2023-11-01 ENCOUNTER — Ambulatory Visit
Admission: RE | Admit: 2023-11-01 | Discharge: 2023-11-01 | Disposition: A | Discharge status: Home / Self Care | Source: Ambulatory Visit | Attending: Physician Assistant | Admitting: Physician Assistant

## 2023-11-01 DIAGNOSIS — E042 Nontoxic multinodular goiter: Secondary | ICD-10-CM | POA: Diagnosis not present

## 2023-11-01 DIAGNOSIS — E079 Disorder of thyroid, unspecified: Secondary | ICD-10-CM | POA: Diagnosis not present

## 2023-11-04 ENCOUNTER — Ambulatory Visit: Payer: Self-pay | Admitting: Physician Assistant

## 2023-11-04 NOTE — Progress Notes (Signed)
 There is a large 5.4 cm thyroid  nodule noted.  Biopsy is recommended.  If if patient agrees, please place referral

## 2023-11-04 NOTE — Telephone Encounter (Signed)
 Copied from CRM [id number]. Topic: General - Other >> Nov 04, 2023 10:52 AM Jyl Or S wrote: Reason for CRM: Patient daughter called about referral to thyroid  specialist please follow up  [phone] Devra Fontana

## 2023-11-08 ENCOUNTER — Ambulatory Visit: Attending: Cardiology | Admitting: Cardiology

## 2023-11-08 VITALS — BP 130/68 | HR 83 | Ht 61.0 in | Wt 213.8 lb

## 2023-11-08 DIAGNOSIS — R600 Localized edema: Secondary | ICD-10-CM

## 2023-11-08 DIAGNOSIS — I10 Essential (primary) hypertension: Secondary | ICD-10-CM

## 2023-11-08 NOTE — Patient Instructions (Signed)
 Medication Instructions:  Your physician recommends that you continue on your current medications as directed. Please refer to the Current Medication list given to you today.   *If you need a refill on your cardiac medications before your next appointment, please call your pharmacy*  Lab Work: No labs ordered today  If you have labs (blood work) drawn today and your tests are completely normal, you will receive your results only by: MyChart Message (if you have MyChart) OR A paper copy in the mail If you have any lab test that is abnormal or we need to change your treatment, we will call you to review the results.  Testing/Procedures: No test ordered today   Follow-Up: At [facility], you and your health needs are our priority.  As part of our continuing mission to provide you with exceptional heart care, our providers are all part of one team.  This team includes your primary Cardiologist (physician) and Advanced Practice Providers or APPs (Physician Assistants and Nurse Practitioners) who all work together to provide you with the care you need, when you need it.  Your next appointment:   6 month(s)  Provider:   You may see Constancia Delton, MD or one of the following Advanced Practice Providers on your designated Care Team:   Laneta Pintos, NP Gildardo Labrador, PA-C Varney Gentleman, PA-C Cadence Kettering, PA-C Ronald Cockayne, NP Morey Ar, NP    We recommend signing up for the patient portal called "MyChart".  Sign up information is provided on this After Visit Summary.  MyChart is used to connect with patients for Virtual Visits (Telemedicine).  Patients are able to view lab/test results, encounter notes, upcoming appointments, etc.  Non-urgent messages can be sent to your provider as well.   To learn more about what you can do with MyChart, go to [email or web address].

## 2023-11-08 NOTE — Progress Notes (Signed)
 Cardiology Office Note:    Date:  11/08/2023   ID:  Vanessa Vazquez, DOB 12-29-1934, MRN [id number]  PCP:  Blane Bunting, PA-C   [facility] Providers Cardiologist:  Constancia Delton, MD     Referring MD: Blane Bunting, PA-C   Chief Complaint  Patient presents with   Follow-up    6 months follow up     History of Present Illness:    Vanessa Vazquez is a 89 y.o. female with a hx of hypertension, hyperlipidemia, diabetes, GERD, obesity who presents for follow-up.    Doing okay, takes Lasix  as needed, has not taken Lasix  over the past week or so.  Complains of shortness of breath with exertion.  Has leg edema today.  Had episodes of worsening fatigue with taking Lasix  every day.  Endorses adequate diuresis when she takes furosemide .  Denies chest pain.  Prior notes/testing Echo 12/2022 EF 55 to 60%, impaired relaxation.   Past Medical History:  Diagnosis Date   Asthma    Diabetes mellitus without complication (HCC)    GERD (gastroesophageal reflux disease)    Hypercholesteremia    Hypertension     Past Surgical History:  Procedure Laterality Date   CESAREAN SECTION     COLONOSCOPY WITH PROPOFOL  N/A 03/19/2016   Procedure: COLONOSCOPY WITH PROPOFOL ;  Surgeon: Cassie Click, MD;  Location: [facility] ENDOSCOPY;  Service: Endoscopy;  Laterality: N/A;    Current Medications: Current Meds  Medication Sig   albuterol  (VENTOLIN  HFA) 108 (90 Base) MCG/ACT inhaler Inhale 2 puffs into the lungs every 4 (four) hours as needed for wheezing or shortness of breath.   ferrous sulfate  325 (65 FE) MG tablet Take 325 mg by mouth once a week.   furosemide  (LASIX ) 20 MG tablet Take 1 tablet (20 mg total) by mouth daily as needed.   gabapentin  (NEURONTIN ) 100 MG capsule Take 100 mg by mouth daily.   gabapentin  (NEURONTIN ) 300 MG capsule Take 1 capsule (300 mg total) by mouth at bedtime.   glimepiride  (AMARYL ) 2 MG tablet TAKE 1 TABLET BY MOUTH TWICE DAILY   loratadine  (CLARITIN ) 10  MG tablet Take 1 tablet (10 mg total) by mouth daily.   losartan -hydrochlorothiazide  (HYZAAR) 50-12.5 MG tablet Take 1 tablet by mouth daily.   metFORMIN  (GLUCOPHAGE ) 1000 MG tablet TAKE 1 TABLET BY MOUTH TWICE DAILY   Misc. Devices (WALKER) MISC 1 each by Does not apply route daily.   omeprazole  (PRILOSEC) 20 MG capsule TAKE 1 CAPSULE BY MOUTH ONCE DAILY   Respiratory Therapy Supplies (NEBULIZER) DEVI 1 Device by Does not apply route 2 (two) times daily as needed.   rosuvastatin  (CRESTOR ) 5 MG tablet Take 1 tablet (5 mg total) by mouth daily.   sitaGLIPtin  (JANUVIA ) 100 MG tablet Take 1 tablet (100 mg total) by mouth daily.     Allergies:   Patient has no known allergies.   Social History   Socioeconomic History   Marital status: Widowed    Spouse name: Not on file   Number of children: Not on file   Years of education: Not on file   Highest education level: GED or equivalent  Occupational History   Not on file  Tobacco Use   Smoking status: Former   Smokeless tobacco: Never  Vaping Use   Vaping status: Never Used  Substance and Sexual Activity   Alcohol use: No    Alcohol/week: 0.0 standard drinks of alcohol   Drug use: No   Sexual activity: Never  Other Topics Concern   Not on file  Social History Narrative   Not on file   Social Drivers of Health   Financial Resource Strain: Low Risk  (01/15/2023)   Overall Financial Resource Strain (CARDIA)    Difficulty of Paying Living Expenses: Not hard at all  Food Insecurity: No Food Insecurity (01/15/2023)   Hunger Vital Sign    Worried About Running Out of Food in the Last Year: Never true    Ran Out of Food in the Last Year: Never true  Transportation Needs: No Transportation Needs (01/15/2023)   PRAPARE - Administrator, Civil Service (Medical): No    Lack of Transportation (Non-Medical): No  Physical Activity: Inactive (01/15/2023)   Exercise Vital Sign    Days of Exercise per Week: 0 days    Minutes of  Exercise per Session: 0 min  Stress: Stress Concern Present (01/15/2023)   [employer] of Occupational Health - Occupational Stress Questionnaire    Feeling of Stress : To some extent  Social Connections: Socially Isolated (01/15/2023)   Social Connection and Isolation Panel    Frequency of Communication with Friends and Family: Twice a week    Frequency of Social Gatherings with Friends and Family: Never    Attends Religious Services: More than 4 times per year    Active Member of [employer] or Organizations: No    Attends Banker Meetings: Never    Marital Status: Widowed     Family History: The patient's family history includes Asthma in her child; Diabetes in her sister; Heart attack in her mother; Heart disease in her mother; Leukemia in her sister; Lung cancer in her father.  ROS:   Please see the history of present illness.     All other systems reviewed and are negative.  EKGs/Labs/Other Studies Reviewed:    The following studies were reviewed today:  EKG Interpretation Date/Time:  Friday November 08 2023 11:28:07 EDT Ventricular Rate:  83 PR Interval:  136 QRS Duration:  134 QT Interval:  384 QTC Calculation: 451 R Axis:   -31  Text Interpretation: Normal sinus rhythm with sinus arrhythmia Left axis deviation Left bundle branch block Confirmed by Constancia Delton ([id number]) on 11/08/2023 11:37:11 AM    Recent Labs: 03/20/2023: TSH 0.608 09/09/2023: Hemoglobin 12.3; NT-Pro BNP 82; Platelets 246 10/11/2023: ALT 10; BUN 21; Creatinine, Ser 1.01; Potassium 4.3; Sodium 142  Recent Lipid Panel    Component Value Date/Time   CHOL 187 09/09/2023 1038   TRIG 102 09/09/2023 1038   HDL 69 09/09/2023 1038   CHOLHDL 2.7 09/09/2023 1038   CHOLHDL 3.5 01/16/2021 1530   LDLCALC 100 (H) 09/09/2023 1038   LDLCALC 103 (H) 01/16/2021 1530     Risk Assessment/Calculations:             Physical Exam:    VS:  BP 130/68 (BP Location: Left Arm, Patient Position:  Sitting, Cuff Size: Large)   Pulse 83   Ht 5' 1 (1.549 m)   Wt 213 lb 12.8 oz (97 kg)   SpO2 96%   BMI 40.40 kg/m     Wt Readings from Last 3 Encounters:  11/08/23 213 lb 12.8 oz (97 kg)  10/11/23 212 lb (96.2 kg)  09/27/23 215 lb (97.5 kg)     GEN:  Well nourished, well developed in no acute distress HEENT: Normal NECK: No JVD; No carotid bruits CARDIAC: RRR, no murmurs, rubs, gallops RESPIRATORY:  Clear to auscultation without rales, wheezing  or rhonchi  ABDOMEN: Soft, non-tender, non-distended MUSCULOSKELETAL:  1-2+ edema; No deformity  SKIN: Warm and dry NEUROLOGIC:  Alert and oriented x 3 PSYCHIATRIC:  Normal affect   ASSESSMENT:    1. Primary hypertension   2. Morbid obesity (HCC)   3. Bilateral leg edema    PLAN:    In order of problems listed above:  Hypertension, BP normal.  Continue losartan -HCTZ 50-12.5 mg daily. obesity.  Continue low-calorie diet, weight loss. Leg edema, morbid obesity contributing, echo 8/24 EF 55 to 60%.  Advised to take Lasix  daily over the next 4 days, then switch to 3 days weekly.  Follow-up in 6 months.     Medication Adjustments/Labs and Tests Ordered: Current medicines are reviewed at length with the patient today.  Concerns regarding medicines are outlined above.  Orders Placed This Encounter  Procedures   EKG 12-Lead   No orders of the defined types were placed in this encounter.   Patient Instructions  Medication Instructions:  Your physician recommends that you continue on your current medications as directed. Please refer to the Current Medication list given to you today.    *If you need a refill on your cardiac medications before your next appointment, please call your pharmacy*  Lab Work: No labs ordered today  If you have labs (blood work) drawn today and your tests are completely normal, you will receive your results only by: MyChart Message (if you have MyChart) OR A paper copy in the mail If you have any  lab test that is abnormal or we need to change your treatment, we will call you to review the results.  Testing/Procedures: No test ordered today   Follow-Up: At [facility], you and your health needs are our priority.  As part of our continuing mission to provide you with exceptional heart care, our providers are all part of one team.  This team includes your primary Cardiologist (physician) and Advanced Practice Providers or APPs (Physician Assistants and Nurse Practitioners) who all work together to provide you with the care you need, when you need it.  Your next appointment:   6 month(s)  Provider:   You may see Constancia Delton, MD or one of the following Advanced Practice Providers on your designated Care Team:   Laneta Pintos, NP Gildardo Labrador, PA-C Varney Gentleman, PA-C Cadence Dayton, PA-C Ronald Cockayne, NP Morey Ar, NP    We recommend signing up for the patient portal called MyChart.  Sign up information is provided on this After Visit Summary.  MyChart is used to connect with patients for Virtual Visits (Telemedicine).  Patients are able to view lab/test results, encounter notes, upcoming appointments, etc.  Non-urgent messages can be sent to your provider as well.   To learn more about what you can do with MyChart, go to [email or web address].          Signed, Constancia Delton, MD  11/08/2023 12:03 PM    [facility]

## 2023-11-11 ENCOUNTER — Telehealth: Payer: Self-pay

## 2023-11-11 ENCOUNTER — Ambulatory Visit (INDEPENDENT_AMBULATORY_CARE_PROVIDER_SITE_OTHER): Admitting: Physician Assistant

## 2023-11-11 ENCOUNTER — Ambulatory Visit: Payer: Self-pay | Admitting: Physician Assistant

## 2023-11-11 ENCOUNTER — Ambulatory Visit
Admission: RE | Admit: 2023-11-11 | Discharge: 2023-11-11 | Disposition: A | Discharge status: Home / Self Care | Source: Ambulatory Visit | Attending: Physician Assistant | Admitting: Physician Assistant

## 2023-11-11 ENCOUNTER — Encounter: Payer: Self-pay | Admitting: Physician Assistant

## 2023-11-11 ENCOUNTER — Encounter
Admission: RE | Admit: 2023-11-11 | Discharge: 2023-11-11 | Disposition: A | Discharge status: Home / Self Care | Source: Ambulatory Visit | Attending: Physician Assistant

## 2023-11-11 VITALS — BP 127/59 | HR 91 | Resp 16 | Wt 203.0 lb

## 2023-11-11 DIAGNOSIS — R5383 Other fatigue: Secondary | ICD-10-CM

## 2023-11-11 DIAGNOSIS — R7989 Other specified abnormal findings of blood chemistry: Secondary | ICD-10-CM

## 2023-11-11 DIAGNOSIS — E041 Nontoxic single thyroid nodule: Secondary | ICD-10-CM | POA: Diagnosis not present

## 2023-11-11 DIAGNOSIS — M7989 Other specified soft tissue disorders: Secondary | ICD-10-CM

## 2023-11-11 DIAGNOSIS — N1832 Chronic kidney disease, stage 3b: Secondary | ICD-10-CM

## 2023-11-11 DIAGNOSIS — N1831 Chronic kidney disease, stage 3a: Secondary | ICD-10-CM | POA: Diagnosis not present

## 2023-11-11 DIAGNOSIS — E559 Vitamin D deficiency, unspecified: Secondary | ICD-10-CM | POA: Diagnosis not present

## 2023-11-11 DIAGNOSIS — R0989 Other specified symptoms and signs involving the circulatory and respiratory systems: Secondary | ICD-10-CM | POA: Diagnosis not present

## 2023-11-11 DIAGNOSIS — R918 Other nonspecific abnormal finding of lung field: Secondary | ICD-10-CM | POA: Diagnosis not present

## 2023-11-11 DIAGNOSIS — E538 Deficiency of other specified B group vitamins: Secondary | ICD-10-CM

## 2023-11-11 DIAGNOSIS — R0602 Shortness of breath: Secondary | ICD-10-CM | POA: Diagnosis not present

## 2023-11-11 DIAGNOSIS — J189 Pneumonia, unspecified organism: Secondary | ICD-10-CM

## 2023-11-11 MED ORDER — TECHNETIUM TO 99M ALBUMIN AGGREGATED
4.0000 | Freq: Once | INTRAVENOUS | Status: AC | PRN
  Administered 2023-11-11: 4.23 via INTRAVENOUS

## 2023-11-11 MED ORDER — DOXYCYCLINE HYCLATE 100 MG PO TABS: 100.0000 mg | ORAL_TABLET | Freq: Two times a day (BID) | ORAL | 0 refills | Status: DC

## 2023-11-11 NOTE — Progress Notes (Signed)
 Spoke with pt's daughter that there are no pulmonary embolism detected.u

## 2023-11-11 NOTE — Progress Notes (Signed)
 Established patient visit  Patient: Vanessa Vazquez   DOB: Nov 15, 1934   89 y.o. Female  MRN: [id number] Visit Date: 11/11/2023  Today's healthcare provider: Blane Bunting, PA-C   Chief Complaint  Patient presents with   Follow-up    1 month f/u No other Concerns. ( Thyroid )   Subjective     HPI     Follow-up    Additional comments: 1 month f/u No other Concerns. ( Thyroid )      Last edited by Estill Hemming, CMA on 11/11/2023 10:01 AM.       Discussed the use of AI scribe software for clinical note transcription with the patient, who gave verbal consent to proceed.  History of Present Illness Vanessa Vazquez is an 89 year old female with thyroid  nodules who presents with persistent fatigue. She is accompanied by Devra Fontana, who is her daughter.   She experiences persistent fatigue and low energy levels, particularly during activities like moving or dressing, despite taking vitamin D  and B12 supplements. She has thyroid  nodules, with large nodules identified on ultrasound that require biopsy. Shortness of breath occurs mainly in the mornings and with movement, without chest pain or palpitations. Swelling in her feet improves with elevation and at night. Fatigue and lack of energy have been noted since starting Lasix . Her diet includes three meals a day, primarily sandwiches, chicken, and occasionally pork chops, with limited salad intake. She consumes six to seven glasses of water daily.       10/11/2023    8:38 AM 09/09/2023   10:10 AM 07/26/2023   10:16 AM  Depression screen PHQ 2/9  Decreased Interest 3 0 0  Down, Depressed, Hopeless 0 0 0  PHQ - 2 Score 3 0 0  Altered sleeping 0 0 0  Tired, decreased energy 2 1 1   Change in appetite 0 0 0  Feeling bad or failure about yourself  0 0 0  Trouble concentrating 0 0 0  Moving slowly or fidgety/restless 0 0 0  Suicidal thoughts 0 0 0  PHQ-9 Score 5 1 1   Difficult doing work/chores Not difficult at all Not difficult at all Not  difficult at all      10/11/2023    8:38 AM 09/09/2023   10:10 AM 07/26/2023   10:16 AM  GAD 7 : Generalized Anxiety Score  Nervous, Anxious, on Edge 0 0 0  Control/stop worrying 0 0 0  Worry too much - different things 0 0 0  Trouble relaxing 0 0 0  Restless 0 0 0  Easily annoyed or irritable 0 0 0  Afraid - awful might happen 0 0 0  Total GAD 7 Score 0 0 0  Anxiety Difficulty Not difficult at all Not difficult at all Not difficult at all    Medications: Outpatient Medications Prior to Visit  Medication Sig   albuterol  (VENTOLIN  HFA) 108 (90 Base) MCG/ACT inhaler Inhale 2 puffs into the lungs every 4 (four) hours as needed for wheezing or shortness of breath.   ferrous sulfate  325 (65 FE) MG tablet Take 325 mg by mouth once a week.   furosemide  (LASIX ) 20 MG tablet Take 1 tablet (20 mg total) by mouth daily as needed.   gabapentin  (NEURONTIN ) 100 MG capsule Take 100 mg by mouth daily.   gabapentin  (NEURONTIN ) 300 MG capsule Take 1 capsule (300 mg total) by mouth at bedtime.   glimepiride  (AMARYL ) 2 MG tablet TAKE 1 TABLET BY MOUTH TWICE DAILY   loratadine  (CLARITIN )  10 MG tablet Take 1 tablet (10 mg total) by mouth daily.   losartan -hydrochlorothiazide  (HYZAAR) 50-12.5 MG tablet Take 1 tablet by mouth daily.   metFORMIN  (GLUCOPHAGE ) 1000 MG tablet TAKE 1 TABLET BY MOUTH TWICE DAILY   Misc. Devices (WALKER) MISC 1 each by Does not apply route daily.   omeprazole  (PRILOSEC) 20 MG capsule TAKE 1 CAPSULE BY MOUTH ONCE DAILY   Respiratory Therapy Supplies (NEBULIZER) DEVI 1 Device by Does not apply route 2 (two) times daily as needed.   rosuvastatin  (CRESTOR ) 5 MG tablet Take 1 tablet (5 mg total) by mouth daily.   sitaGLIPtin  (JANUVIA ) 100 MG tablet Take 1 tablet (100 mg total) by mouth daily.   No facility-administered medications prior to visit.    Review of Systems All negative Except see HPI       Objective    BP (!) 127/59 (BP Location: Left Arm, Patient Position:  Sitting, Cuff Size: Normal)   Pulse 91   Resp 16   Wt 203 lb (92.1 kg)   SpO2 98%   BMI 38.36 kg/m     Physical Exam Vitals reviewed.  Constitutional:      General: She is not in acute distress.    Appearance: Normal appearance. She is well-developed. She is not diaphoretic.  HENT:     Head: Normocephalic and atraumatic.   Eyes:     General: No scleral icterus.    Conjunctiva/sclera: Conjunctivae normal.   Neck:     Thyroid : No thyromegaly.   Cardiovascular:     Rate and Rhythm: Normal rate and regular rhythm.     Pulses: Normal pulses.     Heart sounds: Normal heart sounds. No murmur heard. Pulmonary:     Effort: Pulmonary effort is normal. No respiratory distress.     Breath sounds: Normal breath sounds. No wheezing, rhonchi or rales.   Musculoskeletal:     Cervical back: Neck supple.     Right lower leg: No edema.     Left lower leg: No edema.  Lymphadenopathy:     Cervical: No cervical adenopathy.   Skin:    General: Skin is warm and dry.     Findings: No rash.   Neurological:     Mental Status: She is alert and oriented to person, place, and time. Mental status is at baseline.   Psychiatric:        Mood and Affect: Mood normal.        Behavior: Behavior normal.      No results found for any visits on 11/11/23.      Assessment & Plan Fatigue Dyspnea Abnormal lung sounds Bilateral leg swelling Further evaluation and management required.  Orders chest x-ray  referral to pulmonologist and vascular specialist considered based on work up. - Repeat blood work. - Evaluate for worsening symptoms; consider emergency department if necessary.  Aortic atherosclerosis  Confirmed on recent CTA from 5/21 /2025  Continue rosuvastatin  5 Mg  Will follow-up   Cardiomegaly  Found on recent CTA from 10/16/2023 Enlarged pulmonary arteries may contribute to symptoms pulmonary hypertension.  Concern for thrombus formation; CTA imaging performed. Pulmonologist  evaluation needed. - Refer to pulmonology for assessment. FOLLOW-UP with cardiology.  Patient was seen by cardiology on 11/08/2023 Was advised to continue take losartan /hydrochlorothiazide  50/12.5 and Lasix  3 days weekly Echo 12/2022 EF 55 to 60%  Abnormal D-dimer Troponin was negative X-ray showed small pleural effusion and left basilar patchy opacity which may represent atelectasis, aspiration, pneumonia Considering patient's symptoms,  empirical antibiotic started CTA from 10/24/2023 was inconclusive V/q scan was performed /negative for PE US  doppler was negative for DVT  Chronic kidney disease Decreased kidney function, not severe. Hydration may need improvement. - Encourage to drink at least eight glasses of water daily.  Thyroid  nodules Enlarged nodule/ 4 cm right thyroid  mass require evaluation to rule out malignancy. Endocrinology referral planned. - Schedule thyroid  biopsy - Refer to endocrinology.  Vitamin D  insufficiency Low levels contributing to fatigue. Dosage adjustment needed. - Instruct to take 2000 units of vitamin D3 daily.  Vitamin B12 deficiency Low levels potentially contributing to fatigue. Dosage adjustment needed. - Instruct to take 1000 units of vitamin B12 daily.   No orders of the defined types were placed in this encounter.   No follow-ups on file.   The patient was advised to call back or seek an in-person evaluation if the symptoms worsen or if the condition fails to improve as anticipated.  I discussed the assessment and treatment plan with the patient. The patient was provided an opportunity to ask questions and all were answered. The patient agreed with the plan and demonstrated an understanding of the instructions.  I, Rhodie Cienfuegos, PA-C have reviewed all documentation for this visit. The documentation on 11/11/2023  for the exam, diagnosis, procedures, and orders are all accurate and complete.  Blane Bunting, [facility], MMS [facility] [phone] (phone) [phone] (fax)  [facility]

## 2023-11-11 NOTE — Telephone Encounter (Unsigned)
 Copied from CRM [id number]. Topic: Clinical - Medication Prior Auth >> Nov 11, 2023  3:21 PM [employer] wrote: Reason for CRM: Lynnie Saucier from [facility] stated patient needed prior authorization for a PET scan.  Callback #: [phone]

## 2023-11-12 ENCOUNTER — Ambulatory Visit: Payer: Self-pay

## 2023-11-12 LAB — BASIC METABOLIC PANEL WITH GFR
BUN/Creatinine Ratio: 21 (ref 12–28)
BUN: 27 mg/dL (ref 8–27)
CO2: 23 mmol/L (ref 20–29)
Calcium: 10.7 mg/dL — ABNORMAL HIGH (ref 8.7–10.3)
Chloride: 101 mmol/L (ref 96–106)
Creatinine, Ser: 1.28 mg/dL — ABNORMAL HIGH (ref 0.57–1.00)
Glucose: 175 mg/dL — ABNORMAL HIGH (ref 70–99)
Potassium: 4.1 mmol/L (ref 3.5–5.2)
Sodium: 143 mmol/L (ref 134–144)
eGFR: 41 mL/min/{1.73_m2} — ABNORMAL LOW (ref >59)

## 2023-11-12 LAB — PRO B NATRIURETIC PEPTIDE: NT-Pro BNP: 47 pg/mL (ref 0–738)

## 2023-11-12 LAB — SPECIMEN STATUS REPORT

## 2023-11-12 LAB — D-DIMER, QUANTITATIVE: D-DIMER: 1.14 mg{FEU}/L — ABNORMAL HIGH (ref 0.00–0.49)

## 2023-11-12 NOTE — Telephone Encounter (Signed)
 FYI Only or Action Required?: FYI only for provider  Patient was last seen in primary care on 12/07/2022 by Normie Becton, FNP. Called Nurse Triage reporting Advice Only. Symptoms began today. Interventions attempted: Nothing. Symptoms are: stable.  Triage Disposition: No disposition on file.  Patient/caregiver understands and will follow disposition?:   Copied from CRM [id number]. Topic: Clinical - Medical Advice >> Nov 12, 2023  9:29 AM Vanessa Vazquez wrote: Reason for CRM: Patient called in regarding doxycycline (VIBRA-TABS) 100 MG tablet , stated its making her sleepy and she wanted to know if its okay, would like a callback regarding the prescription   [id number] Answer Assessment - Initial Assessment Questions 1. REASON FOR CALL or QUESTION: What is your reason for calling today? or How can I best help you? or What question do you have that I can help answer?     Patient wanted to know how long she should wait prior to laying down after taking vibra tab.  This nurse instructed patient to wait about a half hour before laying down.  Protocols used: Information Only Call - No Triage-A-AH

## 2023-11-13 DIAGNOSIS — N1832 Chronic kidney disease, stage 3b: Secondary | ICD-10-CM | POA: Insufficient documentation

## 2023-11-13 MED ORDER — AZITHROMYCIN 250 MG PO TABS: ORAL_TABLET | ORAL | 0 refills | Status: AC

## 2023-11-13 NOTE — Progress Notes (Signed)
 Spoke with her daughter.  Medication was sent for suspected pneumonia

## 2023-11-14 NOTE — Telephone Encounter (Signed)
 Patient advised.

## 2023-11-18 DIAGNOSIS — E041 Nontoxic single thyroid nodule: Secondary | ICD-10-CM | POA: Diagnosis not present

## 2023-11-27 ENCOUNTER — Encounter: Payer: Self-pay | Admitting: Physician Assistant

## 2023-11-27 DIAGNOSIS — H26493 Other secondary cataract, bilateral: Secondary | ICD-10-CM | POA: Diagnosis not present

## 2023-11-27 DIAGNOSIS — Z961 Presence of intraocular lens: Secondary | ICD-10-CM | POA: Diagnosis not present

## 2023-11-27 DIAGNOSIS — E119 Type 2 diabetes mellitus without complications: Secondary | ICD-10-CM | POA: Diagnosis not present

## 2023-11-27 LAB — HM DIABETES EYE EXAM

## 2023-12-04 ENCOUNTER — Other Ambulatory Visit: Payer: Self-pay | Admitting: Physician Assistant

## 2023-12-04 DIAGNOSIS — R079 Chest pain, unspecified: Secondary | ICD-10-CM

## 2023-12-11 ENCOUNTER — Other Ambulatory Visit: Payer: Self-pay | Admitting: Physician Assistant

## 2023-12-11 DIAGNOSIS — E7849 Other hyperlipidemia: Secondary | ICD-10-CM

## 2023-12-12 ENCOUNTER — Other Ambulatory Visit: Payer: Self-pay | Admitting: Physician Assistant

## 2023-12-13 NOTE — Progress Notes (Signed)
 This encounter was created in error - please disregard.

## 2023-12-22 NOTE — Progress Notes (Signed)
 " Established patient visit  Patient: Vanessa Vazquez   DOB: 1935-05-01   89 y.o. Female  MRN: [id number] Visit Date: 12/23/2023  Today's healthcare provider: Jolynn Spencer, PA-C   Chief Complaint  Patient presents with   Follow-up   Subjective       Discussed the use of AI scribe software for clinical note transcription with the patient, who gave verbal consent to proceed.  History of Present Illness Vanessa Vazquez is an 89 year old female who presents for follow-up for her chronic conditions.  She has a history of pulmonary embolism with no current embolism on imaging and a positive D-dimer test. She denies chest pain, shortness of breath, or palpitations. She is scheduled for a thyroid  biopsy on August 4th and consulted endocrinology regarding steroid use in June. She previously experienced tiredness and fatigue but currently feels well. She manages her medication regimen, including Coqavis, without issues. She monitors her fluid intake due to kidney function concerns and leg swelling, drinking up to 32 ounces of water daily. Her blood sugar was 98 this morning. She has difficulty with her hand and arm, unable to raise her arm.     12/23/2023   10:38 AM 10/11/2023    8:38 AM 09/09/2023   10:10 AM  Depression screen PHQ 2/9  Decreased Interest 0 3 0  Down, Depressed, Hopeless 0 0 0  PHQ - 2 Score 0 3 0  Altered sleeping 0 0 0  Tired, decreased energy 1 2 1   Change in appetite 0 0 0  Feeling bad or failure about yourself  0 0 0  Trouble concentrating 0 0 0  Moving slowly or fidgety/restless 0 0 0  Suicidal thoughts 0 0 0  PHQ-9 Score 1 5 1   Difficult doing work/chores  Not difficult at all Not difficult at all      12/23/2023   10:38 AM 10/11/2023    8:38 AM 09/09/2023   10:10 AM 07/26/2023   10:16 AM  GAD 7 : Generalized Anxiety Score  Nervous, Anxious, on Edge 0 0 0 0  Control/stop worrying 0 0 0 0  Worry too much - different things 0 0 0 0  Trouble relaxing 0 0 0 0   Restless 0 0 0 0  Easily annoyed or irritable 0 0 0 0  Afraid - awful might happen 0 0 0 0  Total GAD 7 Score 0 0 0 0  Anxiety Difficulty  Not difficult at all Not difficult at all Not difficult at all    Medications: Outpatient Medications Prior to Visit  Medication Sig   albuterol  (VENTOLIN  HFA) 108 (90 Base) MCG/ACT inhaler Inhale 2 puffs into the lungs every 4 (four) hours as needed for wheezing or shortness of breath.   doxycycline  (VIBRA -TABS) 100 MG tablet Take 1 tablet (100 mg total) by mouth 2 (two) times daily.   ferrous sulfate  325 (65 FE) MG tablet Take 325 mg by mouth once a week.   furosemide  (LASIX ) 20 MG tablet Take 1 tablet (20 mg total) by mouth daily as needed.   gabapentin  (NEURONTIN ) 100 MG capsule TAKE 1 CAPSULE BY MOUTH ONCE DAILY   gabapentin  (NEURONTIN ) 300 MG capsule Take 1 capsule (300 mg total) by mouth at bedtime.   glimepiride  (AMARYL ) 2 MG tablet TAKE 1 TABLET BY MOUTH TWICE DAILY   loratadine  (CLARITIN ) 10 MG tablet Take 1 tablet (10 mg total) by mouth daily.   losartan -hydrochlorothiazide  (HYZAAR) 50-12.5 MG tablet Take 1 tablet by mouth  daily.   metFORMIN  (GLUCOPHAGE ) 1000 MG tablet TAKE 1 TABLET BY MOUTH TWICE DAILY   Misc. Devices (WALKER) MISC 1 each by Does not apply route daily.   omeprazole  (PRILOSEC) 20 MG capsule TAKE 1 CAPSULE BY MOUTH ONCE DAILY   Respiratory Therapy Supplies (NEBULIZER) DEVI 1 Device by Does not apply route 2 (two) times daily as needed.   rosuvastatin  (CRESTOR ) 5 MG tablet TAKE 1 TABLET BY MOUTH ONCE EVERY EVENING   sitaGLIPtin  (JANUVIA ) 100 MG tablet Take 1 tablet (100 mg total) by mouth daily.   No facility-administered medications prior to visit.    Review of Systems  All other systems reviewed and are negative.  All negative Except see HPI       Objective    BP 121/60 (BP Location: Left Arm, Patient Position: Sitting, Cuff Size: Large)   Pulse 89   Ht 5' 1 (1.549 m)   Wt 209 lb 14.4 oz (95.2 kg)   SpO2  96%   BMI 39.66 kg/m     Physical Exam Vitals reviewed.  Constitutional:      General: She is not in acute distress.    Appearance: Normal appearance. She is well-developed. She is not diaphoretic.  HENT:     Head: Normocephalic and atraumatic.  Eyes:     General: No scleral icterus.    Conjunctiva/sclera: Conjunctivae normal.  Neck:     Thyroid : No thyromegaly.  Cardiovascular:     Rate and Rhythm: Normal rate and regular rhythm.     Pulses: Normal pulses.     Heart sounds: Normal heart sounds. No murmur heard. Pulmonary:     Effort: Pulmonary effort is normal. No respiratory distress.     Breath sounds: Normal breath sounds. No wheezing, rhonchi or rales.  Musculoskeletal:     Cervical back: Neck supple.     Right lower leg: No edema.     Left lower leg: No edema.  Lymphadenopathy:     Cervical: No cervical adenopathy.  Skin:    General: Skin is warm and dry.     Findings: No rash.  Neurological:     Mental Status: She is alert and oriented to person, place, and time. Mental status is at baseline.  Psychiatric:        Mood and Affect: Mood normal.        Behavior: Behavior normal.      No results found for any visits on 12/23/23.      Assessment & Plan Thyroid  disorder Scheduled for thyroid  biopsy on August 4th. Endocrinology consultation in June discussed steroid use. - Proceed with thyroid  biopsy on August 4th. Follow-up with endocrinology/[facility]  Arm and hand pain/right Referral to orthopedics suggested for evaluation and possible physical therapy. - Refer to orthopedics for evaluation and possible physical therapy.  Chronic kidney disease Advised on hydration balance to support kidney function without causing leg swelling. - Monitor kidney function. - Advise on balanced fluid intake to support kidney function without causing leg swelling.  Diabetes mellitus Chronic Blood sugar within good range. Plan to monitor diabetes control with  hemoglobin A1c test. - Order hemoglobin A1c test to assess diabetes control. Advised to adhere to low-carb diet and regular exercise as tolerated Will follow-up  General Health Maintenance Discussed timing of annual wellness exam. - Schedule annual wellness exam. - Discuss vaccinations at future visits.  Follow-up Follow up in three months for evaluation and management. Fasting blood work needed for cholesterol and hemoglobin A1c. - Schedule  follow-up appointment in three months. - Perform fasting blood work including cholesterol and hemoglobin A1c.   Other fatigue (Primary)  - Hemoglobin A1c - Lipid panel - Comprehensive metabolic panel with GFR - CBC with Differential/Platelet  Abnormal lung sounds Intermittent, denies having shortness of breath chest pain palpitations Referral to pulmonology pending  Stage 3b chronic kidney disease (HCC) Chronic Will need to recheck blood work  Leg swelling Chronic but stable today Continue Lasix  per cardiology/Lasix  3 days weekly, per cardiology Continue compression stockings, raising legs Ultrasound venous from 10/16/2023 - negative No evidence of acute pulmonary embolism on perfusion scintigraphy Will follow-up  Type 2 diabetes mellitus without complication, without long-term current use of insulin  (HCC) - Hemoglobin A1c - Lipid panel - Comprehensive metabolic panel with GFR - CBC with Differential/Platelet  Hyperlipidemia associated with type 2 diabetes mellitus (HCC) Chronic Need to update LP Continue current regiment Will follow-up  Morbid obesity (HCC) Chronic and stable Advise healthy diet and exercise as tolerated Will follow-up  Hypertension associated with diabetes (HCC) Chronic and stable today Continue current regimen/continue losartan /hydrochlorothiazide  50/12.5 Mg daily Will follow-up  Positive D dimer Was 1.14 month ago With negative imaging for pulmonary embolism Referral to pulmonology  pending  Orders Placed This Encounter  Procedures   Hemoglobin A1c   Lipid panel   Comprehensive metabolic panel with GFR   CBC with Differential/Platelet    Return in about 3 months (around 03/24/2024) for chronic disease f/u with AW.   The patient was advised to call back or seek an in-person evaluation if the symptoms worsen or if the condition fails to improve as anticipated.  I discussed the assessment and treatment plan with the patient. The patient was provided an opportunity to ask questions and all were answered. The patient agreed with the plan and demonstrated an understanding of the instructions.  I, Agapito Hanway, PA-C have reviewed all documentation for this visit. The documentation on 12/23/2023  for the exam, diagnosis, procedures, and orders are all accurate and complete.  Jolynn Spencer, [facility], MMS [facility] [phone] (phone) [phone] (fax)  [facility] "

## 2023-12-23 ENCOUNTER — Encounter: Payer: Self-pay | Admitting: Physician Assistant

## 2023-12-23 ENCOUNTER — Ambulatory Visit (INDEPENDENT_AMBULATORY_CARE_PROVIDER_SITE_OTHER): Admitting: Physician Assistant

## 2023-12-23 VITALS — BP 121/60 | HR 89 | Ht 61.0 in | Wt 209.9 lb

## 2023-12-23 DIAGNOSIS — E1159 Type 2 diabetes mellitus with other circulatory complications: Secondary | ICD-10-CM | POA: Diagnosis not present

## 2023-12-23 DIAGNOSIS — E1169 Type 2 diabetes mellitus with other specified complication: Secondary | ICD-10-CM

## 2023-12-23 DIAGNOSIS — E119 Type 2 diabetes mellitus without complications: Secondary | ICD-10-CM

## 2023-12-23 DIAGNOSIS — M7989 Other specified soft tissue disorders: Secondary | ICD-10-CM | POA: Diagnosis not present

## 2023-12-23 DIAGNOSIS — Z7984 Long term (current) use of oral hypoglycemic drugs: Secondary | ICD-10-CM | POA: Diagnosis not present

## 2023-12-23 DIAGNOSIS — R54 Age-related physical debility: Secondary | ICD-10-CM

## 2023-12-23 DIAGNOSIS — I152 Hypertension secondary to endocrine disorders: Secondary | ICD-10-CM | POA: Diagnosis not present

## 2023-12-23 DIAGNOSIS — M47816 Spondylosis without myelopathy or radiculopathy, lumbar region: Secondary | ICD-10-CM

## 2023-12-23 DIAGNOSIS — R413 Other amnesia: Secondary | ICD-10-CM

## 2023-12-23 DIAGNOSIS — E785 Hyperlipidemia, unspecified: Secondary | ICD-10-CM

## 2023-12-23 DIAGNOSIS — E041 Nontoxic single thyroid nodule: Secondary | ICD-10-CM

## 2023-12-23 DIAGNOSIS — I499 Cardiac arrhythmia, unspecified: Secondary | ICD-10-CM

## 2023-12-23 DIAGNOSIS — R0989 Other specified symptoms and signs involving the circulatory and respiratory systems: Secondary | ICD-10-CM

## 2023-12-23 DIAGNOSIS — G8929 Other chronic pain: Secondary | ICD-10-CM

## 2023-12-23 DIAGNOSIS — R7989 Other specified abnormal findings of blood chemistry: Secondary | ICD-10-CM

## 2023-12-23 DIAGNOSIS — I517 Cardiomegaly: Secondary | ICD-10-CM

## 2023-12-23 DIAGNOSIS — R5383 Other fatigue: Secondary | ICD-10-CM

## 2023-12-23 DIAGNOSIS — N1832 Chronic kidney disease, stage 3b: Secondary | ICD-10-CM | POA: Diagnosis not present

## 2023-12-23 DIAGNOSIS — E079 Disorder of thyroid, unspecified: Secondary | ICD-10-CM

## 2023-12-23 DIAGNOSIS — R5382 Chronic fatigue, unspecified: Secondary | ICD-10-CM

## 2023-12-23 DIAGNOSIS — R0602 Shortness of breath: Secondary | ICD-10-CM

## 2023-12-23 DIAGNOSIS — E538 Deficiency of other specified B group vitamins: Secondary | ICD-10-CM

## 2023-12-23 DIAGNOSIS — E559 Vitamin D deficiency, unspecified: Secondary | ICD-10-CM

## 2023-12-23 DIAGNOSIS — G4733 Obstructive sleep apnea (adult) (pediatric): Secondary | ICD-10-CM

## 2023-12-23 DIAGNOSIS — I700 Atherosclerosis of aorta: Secondary | ICD-10-CM

## 2023-12-27 DIAGNOSIS — R0609 Other forms of dyspnea: Secondary | ICD-10-CM | POA: Diagnosis not present

## 2023-12-27 DIAGNOSIS — J4489 Other specified chronic obstructive pulmonary disease: Secondary | ICD-10-CM | POA: Diagnosis not present

## 2023-12-27 DIAGNOSIS — G4733 Obstructive sleep apnea (adult) (pediatric): Secondary | ICD-10-CM | POA: Diagnosis not present

## 2023-12-27 DIAGNOSIS — I272 Pulmonary hypertension, unspecified: Secondary | ICD-10-CM | POA: Diagnosis not present

## 2023-12-30 ENCOUNTER — Encounter: Payer: Self-pay | Admitting: Podiatry

## 2023-12-30 ENCOUNTER — Ambulatory Visit (INDEPENDENT_AMBULATORY_CARE_PROVIDER_SITE_OTHER): Admitting: Podiatry

## 2023-12-30 DIAGNOSIS — E041 Nontoxic single thyroid nodule: Secondary | ICD-10-CM | POA: Diagnosis not present

## 2023-12-30 DIAGNOSIS — R600 Localized edema: Secondary | ICD-10-CM | POA: Diagnosis not present

## 2023-12-30 DIAGNOSIS — M2042 Other hammer toe(s) (acquired), left foot: Secondary | ICD-10-CM | POA: Diagnosis not present

## 2023-12-30 DIAGNOSIS — M79674 Pain in right toe(s): Secondary | ICD-10-CM | POA: Diagnosis not present

## 2023-12-30 DIAGNOSIS — M79675 Pain in left toe(s): Secondary | ICD-10-CM | POA: Diagnosis not present

## 2023-12-30 DIAGNOSIS — E119 Type 2 diabetes mellitus without complications: Secondary | ICD-10-CM

## 2023-12-30 DIAGNOSIS — M21611 Bunion of right foot: Secondary | ICD-10-CM

## 2023-12-30 DIAGNOSIS — M2011 Hallux valgus (acquired), right foot: Secondary | ICD-10-CM | POA: Diagnosis not present

## 2023-12-30 DIAGNOSIS — E0843 Diabetes mellitus due to underlying condition with diabetic autonomic (poly)neuropathy: Secondary | ICD-10-CM | POA: Diagnosis not present

## 2023-12-30 DIAGNOSIS — B351 Tinea unguium: Secondary | ICD-10-CM

## 2023-12-30 DIAGNOSIS — M2041 Other hammer toe(s) (acquired), right foot: Secondary | ICD-10-CM | POA: Diagnosis not present

## 2023-12-30 NOTE — Progress Notes (Signed)
 ANNUAL DIABETIC FOOT EXAM  Subjective: Vanessa Vazquez presents today for annual diabetic foot exam. Chief Complaint  Patient presents with   rfc    Pt is here for a nail trim  PCP is Dr Barnett  Last A1c 5.8 on 03/20/2023 new one was taken on 12/23/2023 results have not been released yet  No changes since last visit    Patient confirms h/o diabetes.  Patient denies any h/o foot wounds.  Patient has been diagnosed with neuropathy.  Patient has bilateral lower extremity edema controlled with compression stocking therapy.  Ostwalt, Janna, PA-C is patient's PCP. LOV 12/23/2023.  Past Medical History:  Diagnosis Date   Asthma    Diabetes mellitus without complication (HCC)    GERD (gastroesophageal reflux disease)    Hypercholesteremia    Hypertension    Patient Active Problem List   Diagnosis Date Noted   Stage 3b chronic kidney disease (HCC) 11/13/2023   Morbid obesity (HCC) 10/17/2023   Positive D dimer 10/17/2023   Right leg swelling 10/17/2023   Left leg swelling 10/17/2023   SOB (shortness of breath) 10/17/2023   Hyperlipidemia associated with type 2 diabetes mellitus (HCC) 10/17/2023   Stage 3a chronic kidney disease (HCC) 07/28/2023   Type 2 diabetes mellitus without complication, without long-term current use of insulin  (HCC) 07/28/2023   Mild obstructive sleep apnea 05/30/2023   Insomnia 05/30/2023   Excessive daytime sleepiness 02/08/2023   Restless sleeper 02/08/2023   Lumbar facet arthropathy 01/22/2023   Primary osteoarthritis of right shoulder 01/22/2023   Chronic pain syndrome 01/22/2023   Chronic right shoulder pain 01/01/2023   Chronic pain of right knee 01/01/2023   Chronic painful diabetic neuropathy (HCC) 01/01/2023   Chronic fatigue and malaise 12/07/2022   Hypertension associated with diabetes (HCC) 12/07/2022   Frailty 12/07/2022   Chronic bilateral low back pain without sciatica 10/24/2022   Primary osteoarthritis of right knee 05/31/2021    Diabetic foot (HCC) 04/18/2021   Iron deficiency anemia secondary to inadequate dietary iron intake 01/06/2020   Class 3 severe obesity with body mass index (BMI) of 40.0 to 44.9 in adult 11/23/2019   AVM (arteriovenous malformation) of colon with hemorrhage 05/11/2016   Past Surgical History:  Procedure Laterality Date   CESAREAN SECTION     COLONOSCOPY WITH PROPOFOL  N/A 03/19/2016   Procedure: COLONOSCOPY WITH PROPOFOL ;  Surgeon: Lamar ONEIDA Holmes, MD;  Location: [facility] ENDOSCOPY;  Service: Endoscopy;  Laterality: N/A;   Current Outpatient Medications on File Prior to Visit  Medication Sig Dispense Refill   albuterol  (VENTOLIN  HFA) 108 (90 Base) MCG/ACT inhaler Inhale 2 puffs into the lungs every 4 (four) hours as needed for wheezing or shortness of breath. 18 g 2   doxycycline  (VIBRA -TABS) 100 MG tablet Take 1 tablet (100 mg total) by mouth 2 (two) times daily. 20 tablet 0   ferrous sulfate  325 (65 FE) MG tablet Take 325 mg by mouth once a week.     furosemide  (LASIX ) 20 MG tablet Take 1 tablet (20 mg total) by mouth daily as needed. 90 tablet 3   gabapentin  (NEURONTIN ) 100 MG capsule TAKE 1 CAPSULE BY MOUTH ONCE DAILY 90 capsule 1   gabapentin  (NEURONTIN ) 300 MG capsule Take 1 capsule (300 mg total) by mouth at bedtime. 30 capsule 2   glimepiride  (AMARYL ) 2 MG tablet TAKE 1 TABLET BY MOUTH TWICE DAILY 180 tablet 0   loratadine  (CLARITIN ) 10 MG tablet Take 1 tablet (10 mg total) by mouth daily. 90  tablet 3   losartan -hydrochlorothiazide  (HYZAAR) 50-12.5 MG tablet Take 1 tablet by mouth daily. 90 tablet 3   metFORMIN  (GLUCOPHAGE ) 1000 MG tablet TAKE 1 TABLET BY MOUTH TWICE DAILY 60 tablet 0   Misc. Devices (WALKER) MISC 1 each by Does not apply route daily. 1 each 0   omeprazole  (PRILOSEC) 20 MG capsule TAKE 1 CAPSULE BY MOUTH ONCE DAILY 30 capsule 3   Respiratory Therapy Supplies (NEBULIZER) DEVI 1 Device by Does not apply route 2 (two) times daily as needed. 1 each 0   rosuvastatin   (CRESTOR ) 5 MG tablet TAKE 1 TABLET BY MOUTH ONCE EVERY EVENING 90 tablet 3   sitaGLIPtin  (JANUVIA ) 100 MG tablet Take 1 tablet (100 mg total) by mouth daily. 90 tablet 3   No current facility-administered medications on file prior to visit.    No Known Allergies Social History   Occupational History   Not on file  Tobacco Use   Smoking status: Former   Smokeless tobacco: Never  Vaping Use   Vaping status: Never Used  Substance and Sexual Activity   Alcohol use: No    Alcohol/week: 0.0 standard drinks of alcohol   Drug use: No   Sexual activity: Never   Family History  Problem Relation Age of Onset   Heart attack Mother    Heart disease Mother    Lung cancer Father    Leukemia Sister    Diabetes Sister    Asthma Child    Immunization History  Administered Date(s) Administered   Fluad Trivalent(High Dose 65+) 03/20/2023   Influenza, Seasonal, Injecte, Preservative Fre 04/23/2006, 03/25/2007, 03/10/2009, 03/07/2010   Pneumococcal Polysaccharide-23 04/12/2003     Review of Systems: Negative except as noted in the HPI.   Objective: There were no vitals filed for this visit.  Vanessa Vazquez is a pleasant 89 y.o. female in NAD. AAO X 3.  Diabetic foot exam was performed with the following findings:   Vascular Examination: Capillary refill time immediate b/l. Palpable pedal pulses. Pedal hair present b/l. Pedal edema, nonpitting mostly around bilateral ankles. No pain with calf compression b/l. Skin temperature gradient WNL b/l. No cyanosis or clubbing b/l. No ischemia or gangrene noted b/l.   Neurological Examination: Sensation grossly intact b/l with 10 gram monofilament. Vibratory sensation intact b/l. Pt has subjective symptoms of neuropathy.  Dermatological Examination: Pedal skin with normal turgor, texture and tone b/l.  No open wounds. No interdigital macerations.   Toenails 1-5 b/l thick, discolored, elongated with subungual debris and pain on dorsal palpation.    No corns, calluses, nor porokeratotic lesions.  Musculoskeletal Examination: Muscle strength 5/5 to all lower extremity muscle groups bilaterally. Hallux valgus with bunion deformity noted right lower extremity. Hammertoe(s) left great toe. Utilizes walker for ambulation assistance.  Radiographs: None     Lab Results  Component Value Date   HGBA1C 5.8 (H) 03/20/2023   ADA Risk Categorization: Low Risk :  Patient has all of the following: Intact protective sensation No prior foot ulcer  No severe deformity Pedal pulses present  Assessment: 1. Pain due to onychomycosis of toenails of both feet   2. Acquired hammertoes of both feet   3. Hallux valgus with bunions of right foot   4. Bilateral lower extremity edema   5. Diabetes mellitus due to underlying condition with diabetic autonomic neuropathy, unspecified whether long term insulin  use (HCC)   6. Encounter for diabetic foot exam (HCC)     Plan:  Diabetic foot examination performed today.  All patient's and/or POA's questions/concerns addressed on today's visit. Patient with bilateral lower extremity edema managed with compression stocking therapy daily. Toenails 1-5 debrided in length and girth without incident. Continue foot and shoe inspections daily. Monitor blood glucose per PCP/Endocrinologist's recommendations. Continue soft, supportive shoe gear daily. Report any pedal injuries to medical professional. Call office if there are any questions/concerns. -Patient/POA to call should there be question/concern in the interim. Return in about 3 months (around 03/31/2024).  Delon LITTIE Merlin, DPM      [place] LOCATION: [address], [place] [address]                   Office [phone]   [facility] LOCATION: [address] [place], [place] [address] Office [phone]

## 2023-12-31 DIAGNOSIS — G4730 Sleep apnea, unspecified: Secondary | ICD-10-CM | POA: Diagnosis not present

## 2023-12-31 DIAGNOSIS — R0902 Hypoxemia: Secondary | ICD-10-CM | POA: Diagnosis not present

## 2024-01-02 DIAGNOSIS — E041 Nontoxic single thyroid nodule: Secondary | ICD-10-CM | POA: Diagnosis not present

## 2024-01-03 ENCOUNTER — Other Ambulatory Visit: Payer: Self-pay | Admitting: Physician Assistant

## 2024-01-09 DIAGNOSIS — N1832 Chronic kidney disease, stage 3b: Secondary | ICD-10-CM | POA: Diagnosis not present

## 2024-01-09 DIAGNOSIS — R0602 Shortness of breath: Secondary | ICD-10-CM | POA: Diagnosis not present

## 2024-01-15 ENCOUNTER — Ambulatory Visit

## 2024-01-15 DIAGNOSIS — Z0000 Encounter for general adult medical examination without abnormal findings: Secondary | ICD-10-CM

## 2024-01-15 NOTE — Patient Instructions (Signed)
 Vanessa Vazquez , Thank you for taking time out of your busy schedule to complete your Annual Wellness Visit with me. I enjoyed our conversation and look forward to speaking with you again next year. I, as well as your care team,  appreciate your ongoing commitment to your health goals. Please review the following plan we discussed and let me know if I can assist you in the future.   Follow up Visits: 01/20/25 @ 11:30 AM BY PHONE We will see or speak with you next year for your Next Medicare AWV with our clinical staff Have you seen your provider in the last 6 months (3 months if uncontrolled diabetes)? Yes  Clinician Recommendations:  Aim for 30 minutes of exercise or brisk walking, 6-8 glasses of water, and 5 servings of fruits and vegetables each day. TAKE CARE!      This is a list of the screenings recommended for you:  Health Maintenance  Topic Date Due   DTaP/Tdap/Td vaccine (1 - Tdap) Never done   Zoster (Shingles) Vaccine (1 of 2) Never done   Pneumococcal Vaccine for age over 9 (2 of 2 - PCV) 04/11/2004   COVID-19 Vaccine (1 - 2024-25 season) Never done   Hemoglobin A1C  09/18/2023   Eye exam for diabetics  11/26/2024   Complete foot exam   12/29/2024   Medicare Annual Wellness Visit  01/14/2025   DEXA scan (bone density measurement)  Completed   HPV Vaccine  Aged Out   Meningitis B Vaccine  Aged Out   Flu Shot  Discontinued    Advanced directives: (ACP Link)Information on Advanced Care Planning can be found at [place]  Secretary of [employer] Advance Health Care Directives Advance Health Care Directives. [email or web address]  Advance Care Planning is important because it:  [x]  Makes sure you receive the medical care that is consistent with your values, goals, and preferences  [x]  It provides guidance to your family and loved ones and reduces their decisional burden about whether or not they are making the right decisions based on your wishes.  Follow the link provided in your after visit  summary or read over the paperwork we have mailed to you to help you started getting your Advance Directives in place. If you need assistance in completing these, please reach out to us  so that we can help you!

## 2024-01-15 NOTE — Progress Notes (Signed)
 Subjective:   Vanessa Vazquez is a 89 y.o. who presents for a Medicare Wellness preventive visit.  As a reminder, Annual Wellness Visits don't include a physical exam, and some assessments may be limited, especially if this visit is performed virtually. We may recommend an in-person follow-up visit with your provider if needed.  Visit Complete: Virtual I connected with  [name] on 01/15/24 by a audio enabled telemedicine application and verified that I am speaking with the correct person using two identifiers.  Patient Location: Home  Provider Location: Home Office  I discussed the limitations of evaluation and management by telemedicine. The patient expressed understanding and agreed to proceed.  Vital Signs: Because this visit was a virtual/telehealth visit, some criteria may be missing or patient reported. Any vitals not documented were not able to be obtained and vitals that have been documented are patient reported.  VideoDeclined- This patient declined Librarian, academic. Therefore the visit was completed with audio only.  Persons Participating in Visit: Patient.  AWV Questionnaire: No: Patient Medicare AWV questionnaire was not completed prior to this visit.  Cardiac Risk Factors include: advanced age (>80men, >24 women);diabetes mellitus;dyslipidemia;hypertension;obesity (BMI >30kg/m2)     Objective:    There were no vitals filed for this visit. There is no height or weight on file to calculate BMI.     01/15/2024   11:40 AM 01/22/2023    9:44 AM 01/15/2023    1:14 PM 01/01/2023    9:32 AM 11/16/2022   11:15 AM 08/30/2022   10:54 AM 05/14/2022    9:49 AM  Advanced Directives  Does Patient Have a Medical Advance Directive? No No No No No No No  Would patient like information on creating a medical advance directive? No - Patient declined   Yes (MAU/Ambulatory/Procedural Areas - Information given)   No - Patient declined    Current Medications  (verified) Outpatient Encounter Medications as of 01/15/2024  Medication Sig   albuterol  (VENTOLIN  HFA) 108 (90 Base) MCG/ACT inhaler Inhale 2 puffs into the lungs every 4 (four) hours as needed for wheezing or shortness of breath.   furosemide  (LASIX ) 20 MG tablet Take 1 tablet (20 mg total) by mouth daily as needed.   gabapentin  (NEURONTIN ) 100 MG capsule TAKE 1 CAPSULE BY MOUTH ONCE DAILY   gabapentin  (NEURONTIN ) 300 MG capsule Take 1 capsule (300 mg total) by mouth at bedtime.   glimepiride  (AMARYL ) 2 MG tablet TAKE 1 TABLET BY MOUTH TWICE DAILY   loratadine  (CLARITIN ) 10 MG tablet Take 1 tablet (10 mg total) by mouth daily.   losartan -hydrochlorothiazide  (HYZAAR) 50-12.5 MG tablet Take 1 tablet by mouth daily.   metFORMIN  (GLUCOPHAGE ) 1000 MG tablet TAKE 1 TABLET BY MOUTH TWICE DAILY   Misc. Devices (WALKER) MISC 1 each by Does not apply route daily.   omeprazole  (PRILOSEC) 20 MG capsule TAKE 1 CAPSULE BY MOUTH ONCE DAILY   Respiratory Therapy Supplies (NEBULIZER) DEVI 1 Device by Does not apply route 2 (two) times daily as needed.   rosuvastatin  (CRESTOR ) 5 MG tablet TAKE 1 TABLET BY MOUTH ONCE EVERY EVENING   sitaGLIPtin  (JANUVIA ) 100 MG tablet Take 1 tablet (100 mg total) by mouth daily.   doxycycline  (VIBRA -TABS) 100 MG tablet Take 1 tablet (100 mg total) by mouth 2 (two) times daily. (Patient not taking: Reported on 01/15/2024)   ferrous sulfate  325 (65 FE) MG tablet Take 325 mg by mouth once a week. (Patient not taking: Reported on 01/15/2024)  No facility-administered encounter medications on file as of 01/15/2024.    Allergies (verified) Patient has no known allergies.   History: Past Medical History:  Diagnosis Date   Asthma    Diabetes mellitus without complication (HCC)    GERD (gastroesophageal reflux disease)    Hypercholesteremia    Hypertension    Past Surgical History:  Procedure Laterality Date   CESAREAN SECTION     COLONOSCOPY WITH PROPOFOL  N/A 03/19/2016    Procedure: COLONOSCOPY WITH PROPOFOL ;  Surgeon: Lamar ONEIDA Holmes, MD;  Location: [facility] ENDOSCOPY;  Service: Endoscopy;  Laterality: N/A;   Family History  Problem Relation Age of Onset   Heart attack Mother    Heart disease Mother    Lung cancer Father    Leukemia Sister    Diabetes Sister    Asthma Child    Social History   Socioeconomic History   Marital status: Widowed    Spouse name: Not on file   Number of children: Not on file   Years of education: Not on file   Highest education level: GED or equivalent  Occupational History   Not on file  Tobacco Use   Smoking status: Former   Smokeless tobacco: Never  Vaping Use   Vaping status: Never Used  Substance and Sexual Activity   Alcohol use: No    Alcohol/week: 0.0 standard drinks of alcohol   Drug use: No   Sexual activity: Never  Other Topics Concern   Not on file  Social History Narrative   Not on file   Social Drivers of Health   Financial Resource Strain: Low Risk  (01/15/2024)   Overall Financial Resource Strain (CARDIA)    Difficulty of Paying Living Expenses: Not hard at all  Food Insecurity: No Food Insecurity (01/15/2024)   Hunger Vital Sign    Worried About Running Out of Food in the Last Year: Never true    Ran Out of Food in the Last Year: Never true  Transportation Needs: No Transportation Needs (01/15/2024)   PRAPARE - Administrator, Civil Service (Medical): No    Lack of Transportation (Non-Medical): No  Physical Activity: Insufficiently Active (01/15/2024)   Exercise Vital Sign    Days of Exercise per Week: 4 days    Minutes of Exercise per Session: 30 min  Stress: No Stress Concern Present (01/15/2024)   [employer] of Occupational Health - Occupational Stress Questionnaire    Feeling of Stress: Not at all  Social Connections: Moderately Isolated (01/15/2024)   Social Connection and Isolation Panel    Frequency of Communication with Friends and Family: More than three times a  week    Frequency of Social Gatherings with Friends and Family: Once a week    Attends Religious Services: More than 4 times per year    Active Member of [employer] or Organizations: No    Attends Banker Meetings: Never    Marital Status: Widowed    Tobacco Counseling Counseling given: Not Answered    Clinical Intake:  Pre-visit preparation completed: Yes  Pain : No/denies pain     BMI - recorded: 39.5 Nutritional Status: BMI > 30  Obese Nutritional Risks: None Diabetes: Yes CBG done?: No Did pt. bring in CBG monitor from home?: No  Lab Results  Component Value Date   HGBA1C 5.8 (H) 03/20/2023   HGBA1C 6.0 (H) 10/23/2022   HGBA1C 4.7 01/16/2022     How often do you need to have someone help you  when you read instructions, pamphlets, or other written materials from your doctor or pharmacy?: 1 - Never  Interpreter Needed?: No  Information entered by :: JHONNIE DAS, LPN   Activities of Daily Living     01/15/2024   11:42 AM  In your present state of health, do you have any difficulty performing the following activities:  Hearing? 0  Vision? 0  Difficulty concentrating or making decisions? 0  Walking or climbing stairs? 1  Comment LEGS & KNEES HURT  Dressing or bathing? 0  Doing errands, shopping? 0  Preparing Food and eating ? N  Using the Toilet? N  In the past six months, have you accidently leaked urine? Y  Do you have problems with loss of bowel control? N  Managing your Medications? N  Managing your Finances? N  Housekeeping or managing your Housekeeping? N    Patient Care Team: Ostwalt, Janna, PA-C as PCP - General (Physician Assistant) Darliss Rogue, MD as PCP - Cardiology (Cardiology) Malachy Comer GAILS, NP as Nurse Practitioner (Nurse Practitioner) Pa, [facility] (Optometry)  I have updated your Care Teams any recent Medical Services you may have received from other providers in the past year.     Assessment:   This  is a routine wellness examination for [place].  Hearing/Vision screen Hearing Screening - Comments:: WEARS AIDS, BOTH EARS Vision Screening - Comments:: READING, DRIVING-  EYE- HAD APPT IN JULY, NO DIABETIC RETINOPATHY    Goals Addressed             This Visit's Progress    DIET - INCREASE WATER INTAKE         Depression Screen     01/15/2024   11:39 AM 12/23/2023   10:38 AM 10/11/2023    8:38 AM 09/09/2023   10:10 AM 07/26/2023   10:16 AM 01/15/2023    1:10 PM 01/01/2023    9:33 AM  PHQ 2/9 Scores  PHQ - 2 Score 0 0 3 0 0 0 0  PHQ- 9 Score 0 1 5 1 1       Fall Risk     01/15/2024   11:42 AM 12/23/2023   10:38 AM 01/22/2023    9:43 AM 01/15/2023    1:07 PM 01/01/2023    9:33 AM  Fall Risk   Falls in the past year? 1 0 0 0 0  Number falls in past yr: 0 0 0 0   Injury with Fall? 0 0  0   Risk for fall due to :  No Fall Risks Impaired balance/gait;Impaired mobility No Fall Risks   Follow up Falls evaluation completed;Falls prevention discussed  Falls evaluation completed;Education provided Education provided;Falls prevention discussed     MEDICARE RISK AT HOME:  Medicare Risk at Home Any stairs in or around the home?: No If so, are there any without handrails?: No Home free of loose throw rugs in walkways, pet beds, electrical cords, etc?: Yes Adequate lighting in your home to reduce risk of falls?: Yes Life alert?: Yes Use of a cane, walker or w/c?: Yes (WALKER ALL THE TIME) Grab bars in the bathroom?: Yes Shower chair or bench in shower?: Yes Elevated toilet seat or a handicapped toilet?: Yes  TIMED UP AND GO:  Was the test performed?  No  Cognitive Function: 6CIT completed    05/11/2021   10:09 AM  MMSE - Mini Mental State Exam  Not completed: Unable to complete        01/15/2024  11:45 AM 01/15/2023    1:16 PM 05/14/2022    9:54 AM 05/11/2021   10:09 AM  6CIT Screen  What Year? 0 points 0 points 0 points 0 points  What month? 0 points 0 points 0  points 0 points  What time? 0 points 0 points 0 points 0 points  Count back from 20 0 points 0 points 0 points 0 points  Months in reverse 0 points 0 points 0 points 0 points  Repeat phrase 0 points 4 points 2 points 0 points  Total Score 0 points 4 points 2 points 0 points    Immunizations Immunization History  Administered Date(s) Administered   Fluad Trivalent(High Dose 65+) 03/20/2023   Influenza, Seasonal, Injecte, Preservative Fre 04/23/2006, 03/25/2007, 03/10/2009, 03/07/2010   Pneumococcal Polysaccharide-23 04/12/2003    Screening Tests Health Maintenance  Topic Date Due   DTaP/Tdap/Td (1 - Tdap) Never done   Zoster Vaccines- Shingrix (1 of 2) Never done   Pneumococcal Vaccine: 50+ Years (2 of 2 - PCV) 04/11/2004   COVID-19 Vaccine (1 - 2024-25 season) Never done   HEMOGLOBIN A1C  09/18/2023   OPHTHALMOLOGY EXAM  11/26/2024   FOOT EXAM  12/29/2024   Medicare Annual Wellness (AWV)  01/14/2025   DEXA SCAN  Completed   HPV VACCINES  Aged Out   Meningococcal B Vaccine  Aged Out   INFLUENZA VACCINE  Discontinued    Health Maintenance  Health Maintenance Due  Topic Date Due   DTaP/Tdap/Td (1 - Tdap) Never done   Zoster Vaccines- Shingrix (1 of 2) Never done   Pneumococcal Vaccine: 50+ Years (2 of 2 - PCV) 04/11/2004   COVID-19 Vaccine (1 - 2024-25 season) Never done   HEMOGLOBIN A1C  09/18/2023   Health Maintenance Items Addressed: AGED OUT OF MAMMOGRAM, BDS, & COLONOSCOPY; DECLINES SHOTS BUT NEEDS PNA, SHINGRIX & COVID  Additional Screening:  Vision Screening: Recommended annual ophthalmology exams for early detection of glaucoma and other disorders of the eye. Would you like a referral to an eye doctor? No    Dental Screening: Recommended annual dental exams for proper oral hygiene  Community Resource Referral / Chronic Care Management: CRR required this visit?  No   CCM required this visit?  No   Plan:    I have personally reviewed and noted the  following in the patient's chart:   Medical and social history Use of alcohol, tobacco or illicit drugs  Current medications and supplements including opioid prescriptions. Patient is not currently taking opioid prescriptions. Functional ability and status Nutritional status Physical activity Advanced directives List of other physicians Hospitalizations, surgeries, and ER visits in previous 12 months Vitals Screenings to include cognitive, depression, and falls Referrals and appointments  In addition, I have reviewed and discussed with patient certain preventive protocols, quality metrics, and best practice recommendations. A written personalized care plan for preventive services as well as general preventive health recommendations were provided to patient.   Jhonnie GORMAN Das, LPN   1/79/7974   After Visit Summary: (MyChart) Due to this being a telephonic visit, the after visit summary with patients personalized plan was offered to patient via MyChart   Notes: Nothing significant to report at this time.

## 2024-01-21 ENCOUNTER — Ambulatory Visit (INDEPENDENT_AMBULATORY_CARE_PROVIDER_SITE_OTHER)

## 2024-01-21 ENCOUNTER — Encounter: Payer: Self-pay | Admitting: Emergency Medicine

## 2024-01-21 ENCOUNTER — Ambulatory Visit
Admission: EM | Admit: 2024-01-21 | Discharge: 2024-01-21 | Disposition: A | Discharge status: Home / Self Care | Attending: Emergency Medicine | Admitting: Emergency Medicine

## 2024-01-21 ENCOUNTER — Telehealth: Payer: Self-pay | Admitting: Emergency Medicine

## 2024-01-21 DIAGNOSIS — R059 Cough, unspecified: Secondary | ICD-10-CM | POA: Diagnosis not present

## 2024-01-21 DIAGNOSIS — R0602 Shortness of breath: Secondary | ICD-10-CM

## 2024-01-21 DIAGNOSIS — R5383 Other fatigue: Secondary | ICD-10-CM | POA: Diagnosis not present

## 2024-01-21 DIAGNOSIS — J45909 Unspecified asthma, uncomplicated: Secondary | ICD-10-CM | POA: Diagnosis not present

## 2024-01-21 LAB — POC SOFIA SARS ANTIGEN FIA: SARS Coronavirus 2 Ag: NEGATIVE

## 2024-01-21 MED ORDER — PSEUDOEPH-BROMPHEN-DM 30-2-10 MG/5ML PO SYRP: 2.5000 mL | ORAL_SOLUTION | Freq: Four times a day (QID) | ORAL | 0 refills | Status: AC | PRN

## 2024-01-21 MED ORDER — PREDNISONE 10 MG (21) PO TBPK
ORAL_TABLET | Freq: Every day | ORAL | 0 refills | Status: DC
Start: 2024-01-21 — End: 2024-03-24

## 2024-01-21 MED ORDER — ALBUTEROL SULFATE (2.5 MG/3ML) 0.083% IN NEBU: 2.5000 mg | INHALATION_SOLUTION | Freq: Once | RESPIRATORY_TRACT | Status: DC

## 2024-01-21 NOTE — ED Triage Notes (Signed)
 Patient reports dry cough, SOB and fatigue that started today. Patient reports she did not take anything for symptoms. Denies pain at this time.

## 2024-01-21 NOTE — Discharge Instructions (Addendum)
 Awaiting chest x-ray results, will update patient with plan of care via telephone

## 2024-01-21 NOTE — ED Provider Notes (Signed)
 CAY RALPH PELT    CSN: [id number] Arrival date & time: 01/21/24  1128      History   Chief Complaint Chief Complaint  Patient presents with   Cough   Fatigue   Shortness of Breath    HPI Vanessa Vazquez is a 89 y.o. female.   Patient presents for evaluation of a productive cough, shortness of breath at rest and increased fatigue beginning this morning upon awakening.  Has not attempted treatment of symptoms.  Denies chest pain, dizziness, lightheadedness, visual changes, nausea vomiting diarrhea, congestion ear pain or sore throat.  History of asthma, COPD, sleep apnea.  Accompanied by family.  No known sick contact.      Past Medical History:  Diagnosis Date   Asthma    Diabetes mellitus without complication (HCC)    GERD (gastroesophageal reflux disease)    Hypercholesteremia    Hypertension     Patient Active Problem List   Diagnosis Date Noted   Stage 3b chronic kidney disease (HCC) 11/13/2023   Morbid obesity (HCC) 10/17/2023   Positive D dimer 10/17/2023   Right leg swelling 10/17/2023   Left leg swelling 10/17/2023   SOB (shortness of breath) 10/17/2023   Hyperlipidemia associated with type 2 diabetes mellitus (HCC) 10/17/2023   Stage 3a chronic kidney disease (HCC) 07/28/2023   Type 2 diabetes mellitus without complication, without long-term current use of insulin  (HCC) 07/28/2023   Mild obstructive sleep apnea 05/30/2023   Insomnia 05/30/2023   Excessive daytime sleepiness 02/08/2023   Restless sleeper 02/08/2023   Lumbar facet arthropathy 01/22/2023   Primary osteoarthritis of right shoulder 01/22/2023   Chronic pain syndrome 01/22/2023   Chronic right shoulder pain 01/01/2023   Chronic pain of right knee 01/01/2023   Chronic painful diabetic neuropathy (HCC) 01/01/2023   Chronic fatigue and malaise 12/07/2022   Hypertension associated with diabetes (HCC) 12/07/2022   Frailty 12/07/2022   Chronic bilateral low back pain without sciatica  10/24/2022   Primary osteoarthritis of right knee 05/31/2021   Diabetic foot (HCC) 04/18/2021   Iron deficiency anemia secondary to inadequate dietary iron intake 01/06/2020   Class 3 severe obesity with body mass index (BMI) of 40.0 to 44.9 in adult 11/23/2019   AVM (arteriovenous malformation) of colon with hemorrhage 05/11/2016    Past Surgical History:  Procedure Laterality Date   CESAREAN SECTION     COLONOSCOPY WITH PROPOFOL  N/A 03/19/2016   Procedure: COLONOSCOPY WITH PROPOFOL ;  Surgeon: Lamar ONEIDA Holmes, MD;  [facility] ENDOSCOPY;  Service: Endoscopy;  Laterality: N/A;    OB History     Gravida  6   Para      Term      Preterm      AB      Living  5      SAB      IAB      Ectopic      Multiple      Live Births  5            Home Medications    Prior to Admission medications   Medication Sig Start Date End Date Taking? Authorizing Provider  albuterol  (VENTOLIN  HFA) 108 (90 Base) MCG/ACT inhaler Inhale 2 puffs into the lungs every 4 (four) hours as needed for wheezing or shortness of breath. 07/08/23   Ostwalt, Janna, PA-C  doxycycline  (VIBRA -TABS) 100 MG tablet Take 1 tablet (100 mg total) by mouth 2 (two) times daily. Patient not taking: Reported on 01/15/2024 11/11/23  Ostwalt, Janna, PA-C  ferrous sulfate  325 (65 FE) MG tablet Take 325 mg by mouth once a week. Patient not taking: Reported on 01/15/2024    [provider]  furosemide  (LASIX ) 20 MG tablet Take 1 tablet (20 mg total) by mouth daily as needed. 12/07/22 01/15/24  Darliss Rogue, MD  gabapentin  (NEURONTIN ) 100 MG capsule TAKE 1 CAPSULE BY MOUTH ONCE DAILY 12/04/23   Ostwalt, Janna, PA-C  gabapentin  (NEURONTIN ) 300 MG capsule Take 1 capsule (300 mg total) by mouth at bedtime. 01/01/23   Marcelino Nurse, MD  glimepiride  (AMARYL ) 2 MG tablet TAKE 1 TABLET BY MOUTH TWICE DAILY 10/29/23   Ostwalt, Janna, PA-C  loratadine  (CLARITIN ) 10 MG tablet Take 1 tablet (10 mg total) by mouth daily.  03/20/23   Ostwalt, Janna, PA-C  losartan -hydrochlorothiazide  (HYZAAR) 50-12.5 MG tablet Take 1 tablet by mouth daily. 03/22/23   Darliss Rogue, MD  metFORMIN  (GLUCOPHAGE ) 1000 MG tablet TAKE 1 TABLET BY MOUTH TWICE DAILY 01/03/24   Ostwalt, Janna, PA-C  Misc. Devices (WALKER) MISC 1 each by Does not apply route daily. 10/03/21   Britta King, MD  omeprazole  (PRILOSEC) 20 MG capsule TAKE 1 CAPSULE BY MOUTH ONCE DAILY 12/04/23   Ostwalt, Janna, PA-C  Respiratory Therapy Supplies (NEBULIZER) DEVI 1 Device by Does not apply route 2 (two) times daily as needed. 11/02/20   Britta King, MD  rosuvastatin  (CRESTOR ) 5 MG tablet TAKE 1 TABLET BY MOUTH ONCE EVERY EVENING 12/12/23   Ostwalt, Janna, PA-C  sitaGLIPtin  (JANUVIA ) 100 MG tablet Take 1 tablet (100 mg total) by mouth daily. 02/07/23   Ostwalt, Janna, PA-C    Family History Family History  Problem Relation Age of Onset   Heart attack Mother    Heart disease Mother    Lung cancer Father    Leukemia Sister    Diabetes Sister    Asthma Child     Social History Social History   Tobacco Use   Smoking status: Former   Smokeless tobacco: Never  Advertising account planner   Vaping status: Never Used  Substance Use Topics   Alcohol use: No    Alcohol/week: 0.0 standard drinks of alcohol   Drug use: No     Allergies   Patient has no known allergies.   Review of Systems Review of Systems  Respiratory:  Positive for cough and shortness of breath.      Physical Exam Triage Vital Signs ED Triage Vitals  Encounter Vitals Group     BP 01/21/24 1150 139/79     Girls Systolic BP Percentile --      Girls Diastolic BP Percentile --      Boys Systolic BP Percentile --      Boys Diastolic BP Percentile --      Pulse Rate 01/21/24 1150 65     Resp 01/21/24 1150 20     Temp 01/21/24 1150 98.2 F (36.8 C)     Temp Source 01/21/24 1150 Oral     SpO2 01/21/24 1153 96 %     Weight --      Height --      Head Circumference --      Peak Flow --       Pain Score 01/21/24 1154 0     Pain Loc --      Pain Education --      Exclude from Growth Chart --    No data found.  Updated Vital Signs BP 139/79 (BP Location: Right Arm)  Pulse 65   Temp 98.2 F (36.8 C) (Oral)   Resp 20   SpO2 96%   Visual Acuity Right Eye Distance:   Left Eye Distance:   Bilateral Distance:    Right Eye Near:   Left Eye Near:    Bilateral Near:     Physical Exam Constitutional:      Appearance: Normal appearance.  HENT:     Head: Normocephalic.     Right Ear: Tympanic membrane, ear canal and external ear normal.     Left Ear: Tympanic membrane, ear canal and external ear normal.     Nose: Nose normal.  Eyes:     Extraocular Movements: Extraocular movements intact.  Cardiovascular:     Rate and Rhythm: Normal rate and regular rhythm.     Pulses: Normal pulses.     Heart sounds: Normal heart sounds.  Pulmonary:     Effort: Pulmonary effort is normal.     Breath sounds: Normal breath sounds.  Skin:    General: Skin is warm and dry.  Neurological:     Mental Status: She is alert and oriented to person, place, and time. Mental status is at baseline.      UC Treatments / Results  Labs (all labs ordered are listed, but only abnormal results are displayed) Labs Reviewed  POC SOFIA SARS ANTIGEN FIA    EKG   Radiology No results found.  Procedures Procedures (including critical care time)  Medications Ordered in UC Medications  albuterol  (PROVENTIL ) (2.5 MG/3ML) 0.083% nebulizer solution 2.5 mg (has no administration in time range)    Initial Impression / Assessment and Plan / UC Course  I have reviewed the triage vital signs and the nursing notes.  Pertinent labs & imaging results that were available during my care of the patient were reviewed by me and considered in my medical decision making (see chart for details).  Shortness of breath  Vital signs are within normal range, patient with initially entering into the clinic  breathing appears labored and she endorses shortness of breath, leaning over her walker, on initial evaluation lungs are clear to auscultation, S1 and S2 heard on exam, pulse oximeter showing 100% on room air with a heart rate of 115, EKG showing normal sinus rhythm, heart rate has decreased to the 80s, patient appears more calm while sitting and and pulmonary effort has been reduced to a normal pace and rhythm, COVID testing negative, discussed findings, chest x-ray pending, will notify patient of results via telephone, awaiting results of chest x-ray to formulate plan of care Final Clinical Impressions(s) / UC Diagnoses   Final diagnoses:  Shortness of breath     Discharge Instructions      Awaiting chest x-ray results, will update patient with plan of care via telephone   ED Prescriptions   None    PDMP not reviewed this encounter.   Teresa Shelba SAUNDERS, NP 01/21/24 1248

## 2024-01-21 NOTE — Telephone Encounter (Signed)
 Reported x-ray results to patient via telephone, 2 patient identifiers use, no abnormality present, most likely asthmatic flare versus viral illness however at this time does not have any upper respiratory symptoms, stable for outpatient management, prescribed prednisone  and discussed effect on blood sugar and advised to monitor closely additionally prescribed Bromfed for management of cough and recommended use of albuterol  inhaler, may follow-up with his urgent care as needed but advised to notify her primary doctor

## 2024-01-28 ENCOUNTER — Ambulatory Visit: Admitting: Nurse Practitioner

## 2024-02-04 ENCOUNTER — Other Ambulatory Visit: Payer: Self-pay | Admitting: Physician Assistant

## 2024-02-04 DIAGNOSIS — E118 Type 2 diabetes mellitus with unspecified complications: Secondary | ICD-10-CM

## 2024-02-26 DIAGNOSIS — N1832 Chronic kidney disease, stage 3b: Secondary | ICD-10-CM | POA: Diagnosis not present

## 2024-02-26 DIAGNOSIS — R0602 Shortness of breath: Secondary | ICD-10-CM | POA: Diagnosis not present

## 2024-02-28 DIAGNOSIS — R0609 Other forms of dyspnea: Secondary | ICD-10-CM | POA: Diagnosis not present

## 2024-02-28 DIAGNOSIS — G4733 Obstructive sleep apnea (adult) (pediatric): Secondary | ICD-10-CM | POA: Diagnosis not present

## 2024-02-28 DIAGNOSIS — G4734 Idiopathic sleep related nonobstructive alveolar hypoventilation: Secondary | ICD-10-CM | POA: Diagnosis not present

## 2024-02-28 DIAGNOSIS — J4520 Mild intermittent asthma, uncomplicated: Secondary | ICD-10-CM | POA: Diagnosis not present

## 2024-03-06 ENCOUNTER — Other Ambulatory Visit: Payer: Self-pay | Admitting: Physician Assistant

## 2024-03-06 ENCOUNTER — Other Ambulatory Visit: Payer: Self-pay | Admitting: Cardiology

## 2024-03-06 DIAGNOSIS — E118 Type 2 diabetes mellitus with unspecified complications: Secondary | ICD-10-CM

## 2024-03-23 DIAGNOSIS — R5383 Other fatigue: Secondary | ICD-10-CM | POA: Diagnosis not present

## 2024-03-23 DIAGNOSIS — E119 Type 2 diabetes mellitus without complications: Secondary | ICD-10-CM | POA: Diagnosis not present

## 2024-03-24 ENCOUNTER — Ambulatory Visit: Payer: Self-pay | Admitting: Physician Assistant

## 2024-03-24 ENCOUNTER — Encounter: Payer: Self-pay | Admitting: Physician Assistant

## 2024-03-24 ENCOUNTER — Ambulatory Visit: Admitting: Physician Assistant

## 2024-03-24 VITALS — BP 112/64 | HR 93 | Temp 98.3°F | Ht 60.0 in | Wt 215.6 lb

## 2024-03-24 DIAGNOSIS — M25511 Pain in right shoulder: Secondary | ICD-10-CM

## 2024-03-24 DIAGNOSIS — M545 Low back pain, unspecified: Secondary | ICD-10-CM

## 2024-03-24 DIAGNOSIS — E1140 Type 2 diabetes mellitus with diabetic neuropathy, unspecified: Secondary | ICD-10-CM | POA: Diagnosis not present

## 2024-03-24 DIAGNOSIS — R5382 Chronic fatigue, unspecified: Secondary | ICD-10-CM

## 2024-03-24 DIAGNOSIS — E785 Hyperlipidemia, unspecified: Secondary | ICD-10-CM | POA: Diagnosis not present

## 2024-03-24 DIAGNOSIS — R5381 Other malaise: Secondary | ICD-10-CM | POA: Diagnosis not present

## 2024-03-24 DIAGNOSIS — M7989 Other specified soft tissue disorders: Secondary | ICD-10-CM

## 2024-03-24 DIAGNOSIS — E1169 Type 2 diabetes mellitus with other specified complication: Secondary | ICD-10-CM | POA: Diagnosis not present

## 2024-03-24 DIAGNOSIS — E118 Type 2 diabetes mellitus with unspecified complications: Secondary | ICD-10-CM | POA: Diagnosis not present

## 2024-03-24 DIAGNOSIS — N1832 Chronic kidney disease, stage 3b: Secondary | ICD-10-CM | POA: Diagnosis not present

## 2024-03-24 DIAGNOSIS — M25561 Pain in right knee: Secondary | ICD-10-CM

## 2024-03-24 DIAGNOSIS — G894 Chronic pain syndrome: Secondary | ICD-10-CM

## 2024-03-24 DIAGNOSIS — G8929 Other chronic pain: Secondary | ICD-10-CM

## 2024-03-24 LAB — CBC WITH DIFFERENTIAL/PLATELET
Basophils Absolute: 0.1 x10E3/uL (ref 0.0–0.2)
Basos: 1 %
EOS (ABSOLUTE): 0.1 x10E3/uL (ref 0.0–0.4)
Eos: 2 %
Hematocrit: 36.7 % (ref 34.0–46.6)
Hemoglobin: 11.9 g/dL (ref 11.1–15.9)
Immature Grans (Abs): 0 x10E3/uL (ref 0.0–0.1)
Immature Granulocytes: 0 %
Lymphocytes Absolute: 1.7 x10E3/uL (ref 0.7–3.1)
Lymphs: 32 %
MCH: 31 pg (ref 26.6–33.0)
MCHC: 32.4 g/dL (ref 31.5–35.7)
MCV: 96 fL (ref 79–97)
Monocytes Absolute: 0.4 x10E3/uL (ref 0.1–0.9)
Monocytes: 7 %
Neutrophils Absolute: 3 x10E3/uL (ref 1.4–7.0)
Neutrophils: 58 %
Platelets: 242 x10E3/uL (ref 150–450)
RBC: 3.84 x10E6/uL (ref 3.77–5.28)
RDW: 11 % — ABNORMAL LOW (ref 11.7–15.4)
WBC: 5.2 x10E3/uL (ref 3.4–10.8)

## 2024-03-24 LAB — COMPREHENSIVE METABOLIC PANEL WITH GFR
ALT: 13 IU/L (ref 0–32)
AST: 20 IU/L (ref 0–40)
Albumin: 4.2 g/dL (ref 3.7–4.7)
Alkaline Phosphatase: 84 IU/L (ref 48–129)
BUN/Creatinine Ratio: 19 (ref 12–28)
BUN: 21 mg/dL (ref 8–27)
Bilirubin Total: 0.4 mg/dL (ref 0.0–1.2)
CO2: 22 mmol/L (ref 20–29)
Calcium: 10.4 mg/dL — ABNORMAL HIGH (ref 8.7–10.3)
Chloride: 103 mmol/L (ref 96–106)
Creatinine, Ser: 1.09 mg/dL — ABNORMAL HIGH (ref 0.57–1.00)
Globulin, Total: 2.6 g/dL (ref 1.5–4.5)
Glucose: 208 mg/dL — ABNORMAL HIGH (ref 70–99)
Potassium: 4.2 mmol/L (ref 3.5–5.2)
Sodium: 142 mmol/L (ref 134–144)
Total Protein: 6.8 g/dL (ref 6.0–8.5)
eGFR: 49 mL/min/1.73 — ABNORMAL LOW (ref >59)

## 2024-03-24 LAB — LIPID PANEL
Chol/HDL Ratio: 2.7 ratio (ref 0.0–4.4)
Cholesterol, Total: 199 mg/dL (ref 100–199)
HDL: 74 mg/dL (ref >39)
LDL Chol Calc (NIH): 104 mg/dL — ABNORMAL HIGH (ref 0–99)
Triglycerides: 121 mg/dL (ref 0–149)
VLDL Cholesterol Cal: 21 mg/dL (ref 5–40)

## 2024-03-24 LAB — HEMOGLOBIN A1C
Est. average glucose Bld gHb Est-mCnc: 114 mg/dL
Hgb A1c MFr Bld: 5.6 % (ref 4.8–5.6)

## 2024-03-24 MED ORDER — ROSUVASTATIN CALCIUM 10 MG PO TABS: 10.0000 mg | ORAL_TABLET | Freq: Every day | ORAL | 3 refills | Status: AC

## 2024-03-24 NOTE — Progress Notes (Signed)
 Established patient visit  Patient: Vanessa Vazquez   DOB: 08-Oct-1934   89 y.o. Female  MRN: [id number] Visit Date: 03/24/2024  Today's healthcare provider: Jolynn Spencer, PA-C   Chief Complaint  Patient presents with   Medical Management of Chronic Issues    Patient is here for a follow up.  Concerned about right hand can't use it, states that she can't raise it no higher than shoulders and her fingers she can't stretch them out.  Declined vaccines.   Subjective     HPI     Medical Management of Chronic Issues    Additional comments: Patient is here for a follow up.  Concerned about right hand can't use it, states that she can't raise it no higher than shoulders and her fingers she can't stretch them out.  Declined vaccines.      Last edited by Terrel Powell CROME, CMA on 03/24/2024  9:59 AM.       Discussed the use of AI scribe software for clinical note transcription with the patient, who gave verbal consent to proceed.  History of Present Illness Vanessa Vazquez is an 89 year old female with diabetes and chronic kidney disease who presents for follow-up on her blood sugar and kidney function.  Her recent lab work shows elevated blood sugar at 200 mg/dL, possibly influenced by eating cereal prior to the test. Her A1c is 5.6, indicating excellent long-term control. She manages her diabetes with dietary modifications and medication. Her kidney function has improved since the last visit, though it remains low with a GFR of 49.  She manages hyperlipidemia with rosuvastatin , taking 5 mg daily. Her LDL cholesterol is over 100 mg/dL, which is higher than the target for patients with diabetes.  She has a history of thyroid  issues and underwent a biopsy, which confirmed that the thyroid  nodule was not cancerous. She experiences chronic right shoulder pain and has limited mobility in her right arm. She was referred to physical therapy but did not attend and has not seen an orthopedic  specialist recently.  She experiences occasional chest pain but not frequently. No shortness of breath or rapid heart beating. She uses a walker for mobility and has difficulty moving without it, primarily using it at home and when visiting the doctor.       01/15/2024   11:39 AM 12/23/2023   10:38 AM 10/11/2023    8:38 AM  Depression screen PHQ 2/9  Decreased Interest 0 0 3  Down, Depressed, Hopeless 0 0 0  PHQ - 2 Score 0 0 3  Altered sleeping 0 0 0  Tired, decreased energy 0 1 2  Change in appetite 0 0 0  Feeling bad or failure about yourself  0 0 0  Trouble concentrating 0 0 0  Moving slowly or fidgety/restless 0 0 0  Suicidal thoughts 0 0 0  PHQ-9 Score 0 1 5  Difficult doing work/chores   Not difficult at all      12/23/2023   10:38 AM 10/11/2023    8:38 AM 09/09/2023   10:10 AM 07/26/2023   10:16 AM  GAD 7 : Generalized Anxiety Score  Nervous, Anxious, on Edge 0 0 0 0  Control/stop worrying 0 0 0 0  Worry too much - different things 0 0 0 0  Trouble relaxing 0 0 0 0  Restless 0 0 0 0  Easily annoyed or irritable 0 0 0 0  Afraid - awful might happen 0 0 0 0  Total GAD  7 Score 0 0 0 0  Anxiety Difficulty  Not difficult at all Not difficult at all Not difficult at all    Medications: Outpatient Medications Prior to Visit  Medication Sig   albuterol  (VENTOLIN  HFA) 108 (90 Base) MCG/ACT inhaler Inhale 2 puffs into the lungs every 4 (four) hours as needed for wheezing or shortness of breath.   atenolol  (TENORMIN ) 50 MG tablet Take 50 mg by mouth daily.   brompheniramine-pseudoephedrine-DM 30-2-10 MG/5ML syrup Take 2.5 mLs by mouth 4 (four) times daily as needed.   cholecalciferol (VITAMIN D3) 25 MCG (1000 UNIT) tablet Take 1,000 Units by mouth daily.   cyanocobalamin (VITAMIN B12) 1000 MCG tablet Take 1,000 mcg by mouth daily.   ferrous sulfate  325 (65 FE) MG tablet Take 325 mg by mouth once a week.   furosemide  (LASIX ) 20 MG tablet Take 1 tablet (20 mg total) by mouth  daily as needed.   gabapentin  (NEURONTIN ) 100 MG capsule TAKE 1 CAPSULE BY MOUTH ONCE DAILY   glimepiride  (AMARYL ) 2 MG tablet TAKE 1 TABLET BY MOUTH TWICE DAILY   JANUVIA  100 MG tablet TAKE 1 TABLET BY MOUTH ONCE DAILY   loratadine  (CLARITIN ) 10 MG tablet Take 1 tablet (10 mg total) by mouth daily.   losartan -hydrochlorothiazide  (HYZAAR) 50-12.5 MG tablet TAKE 1 TABLET BY MOUTH ONCE DAILY   metFORMIN  (GLUCOPHAGE ) 1000 MG tablet TAKE 1 TABLET BY MOUTH TWICE DAILY   Misc. Devices (WALKER) MISC 1 each by Does not apply route daily.   omeprazole  (PRILOSEC) 20 MG capsule TAKE 1 CAPSULE BY MOUTH ONCE DAILY   Respiratory Therapy Supplies (NEBULIZER) DEVI 1 Device by Does not apply route 2 (two) times daily as needed.   rosuvastatin  (CRESTOR ) 5 MG tablet TAKE 1 TABLET BY MOUTH ONCE EVERY EVENING   [DISCONTINUED] doxycycline  (VIBRA -TABS) 100 MG tablet Take 1 tablet (100 mg total) by mouth 2 (two) times daily. (Patient not taking: Reported on 01/15/2024)   [DISCONTINUED] predniSONE  (STERAPRED UNI-PAK 21 TAB) 10 MG (21) TBPK tablet Take by mouth daily. Take 6 tabs by mouth daily  for 1 days, then 5 tabs for 1 days, then 4 tabs for 1 days, then 3 tabs for 1 days, 2 tabs for 1 days, then 1 tab by mouth daily for 1 days   No facility-administered medications prior to visit.    Review of Systems All negative Except see HPI   {Insert previous labs (optional):23779} {See past labs  Heme  Chem  Endocrine  Serology  Results Review (optional):1}   Objective    BP 112/64 (BP Location: Right Arm, Patient Position: Sitting, Cuff Size: Normal)   Pulse 93   Temp 98.3 F (36.8 C) (Oral)   Ht 5' (1.524 m)   Wt 215 lb 9.6 oz (97.8 kg)   SpO2 98%   BMI 42.11 kg/m  {Insert last BP/Wt (optional):23777}{See vitals history (optional):1}   Physical Exam Vitals reviewed.  Constitutional:      General: She is not in acute distress.    Appearance: Normal appearance. She is well-developed. She is not  diaphoretic.  HENT:     Head: Normocephalic and atraumatic.  Eyes:     General: No scleral icterus.    Conjunctiva/sclera: Conjunctivae normal.  Neck:     Thyroid : No thyromegaly.  Cardiovascular:     Rate and Rhythm: Normal rate and regular rhythm.     Pulses: Normal pulses.     Heart sounds: Normal heart sounds. No murmur heard. Pulmonary:  Effort: Pulmonary effort is normal. No respiratory distress.     Breath sounds: Normal breath sounds. No wheezing, rhonchi or rales.  Musculoskeletal:     Cervical back: Neck supple.     Right lower leg: No edema.     Left lower leg: No edema.  Lymphadenopathy:     Cervical: No cervical adenopathy.  Skin:    General: Skin is warm and dry.     Findings: No rash.  Neurological:     Mental Status: She is alert and oriented to person, place, and time. Mental status is at baseline.  Psychiatric:        Mood and Affect: Mood normal.        Behavior: Behavior normal.      No results found for any visits on 03/24/24.      Assessment & Plan Type 2 diabetes mellitus Chronic Blood sugar elevated at 200 mg/dL, likely postprandial. [id number] controlled at 5.6%. - Advise monitoring dietary intake to manage blood sugar levels. - Encourage drinking 1-2 liters of water daily, ensuring no leg swelling occurs. Will follow-up  Chronic kidney disease, stage 3a GFR improved to 49 mL/min/1.73 m but remains low. Advised to drink plenty of water Will follow-up  Hyperlipidemia Chronic  LDL over 100 mg/dL, insufficient control with current rosuvastatin  5 mg. Target LDL <70 mg/dL due to diabetes. - Increase rosuvastatin  to 10 mg daily. - Instruct to take two 5 mg tablets until current prescription is finished, then switch to 10 mg tablets. Continue low-cholesterol diet and regular exercise as tolerated  Chronic right shoulder pain with limited mobility Limited mobility persists, no recent orthopedic follow-up. - Refer to orthopedics for evaluation  and possible imaging. - Advise scheduling an appointment with [facility] for assessment and potential imaging Will follow-up  Lower extremity edema Non-pitting edema present in lower extremities. Compression stockings advised We will follow-up  Impaired mobility requiring walker/rollator Mobility impaired, current rollator 31-32 years old. - Investigate insurance coverage for a new rollator. - Coordinate with a supplier for a new rollator if covered by insurance.  Benign thyroid  nodule, status post biopsy Biopsy results benign, no malignancy reported. - Confirm biopsy results with endocrinology.  General Health Maintenance  Follow-Up - Schedule follow-up appointment in 7-8 weeks, before December 16th, due to provider's vacation.    No orders of the defined types were placed in this encounter.   Return in about 8 weeks (around 05/19/2024).   The patient was advised to call back or seek an in-person evaluation if the symptoms worsen or if the condition fails to improve as anticipated.  I discussed the assessment and treatment plan with the patient. The patient was provided an opportunity to ask questions and all were answered. The patient agreed with the plan and demonstrated an understanding of the instructions.  I, Kennidee Heyne, PA-C have reviewed all documentation for this visit. The documentation on 03/24/2024  for the exam, diagnosis, procedures, and orders are all accurate and complete.  Jolynn Spencer, [facility], MMS [facility] [phone] (phone) [phone] (fax)  [facility]

## 2024-03-24 NOTE — Progress Notes (Signed)
 Discussed all labs during the OV today. Just for records.

## 2024-03-30 ENCOUNTER — Ambulatory Visit: Admitting: Podiatry

## 2024-04-17 ENCOUNTER — Other Ambulatory Visit: Payer: Self-pay | Admitting: Physician Assistant

## 2024-04-17 DIAGNOSIS — E118 Type 2 diabetes mellitus with unspecified complications: Secondary | ICD-10-CM

## 2024-04-17 DIAGNOSIS — R079 Chest pain, unspecified: Secondary | ICD-10-CM

## 2024-05-06 ENCOUNTER — Other Ambulatory Visit: Payer: Self-pay | Admitting: Cardiology

## 2024-05-06 NOTE — Telephone Encounter (Signed)
Please contact pt for future appointment. 

## 2024-05-14 ENCOUNTER — Ambulatory Visit: Attending: Nurse Practitioner | Admitting: Nurse Practitioner

## 2024-05-14 ENCOUNTER — Encounter: Payer: Self-pay | Admitting: Nurse Practitioner

## 2024-05-14 VITALS — BP 130/60 | HR 84 | Ht 60.0 in | Wt 210.3 lb

## 2024-05-14 DIAGNOSIS — N1831 Chronic kidney disease, stage 3a: Secondary | ICD-10-CM | POA: Diagnosis not present

## 2024-05-14 DIAGNOSIS — E119 Type 2 diabetes mellitus without complications: Secondary | ICD-10-CM

## 2024-05-14 DIAGNOSIS — I10 Essential (primary) hypertension: Secondary | ICD-10-CM

## 2024-05-14 DIAGNOSIS — I5032 Chronic diastolic (congestive) heart failure: Secondary | ICD-10-CM

## 2024-05-14 DIAGNOSIS — E785 Hyperlipidemia, unspecified: Secondary | ICD-10-CM | POA: Diagnosis not present

## 2024-05-14 NOTE — Patient Instructions (Signed)
 Medication Instructions:  Your physician recommends that you continue on your current medications as directed. Please refer to the Current Medication list given to you today.   *If you need a refill on your cardiac medications before your next appointment, please call your pharmacy*  Lab Work: No labs ordered today  If you have labs (blood work) drawn today and your tests are completely normal, you will receive your results only by: MyChart Message (if you have MyChart) OR A paper copy in the mail If you have any lab test that is abnormal or we need to change your treatment, we will call you to review the results.  Testing/Procedures: No test ordered today   Follow-Up: At [facility], you and your health needs are our priority.  As part of our continuing mission to provide you with exceptional heart care, our providers are all part of one team.  This team includes your primary Cardiologist (physician) and Advanced Practice Providers or APPs (Physician Assistants and Nurse Practitioners) who all work together to provide you with the care you need, when you need it.  Your next appointment:   3 month(s)  Provider:   Lonni Meager, NP    We recommend signing up for the patient portal called MyChart.  Sign up information is provided on this After Visit Summary.  MyChart is used to connect with patients for Virtual Visits (Telemedicine).  Patients are able to view lab/test results, encounter notes, upcoming appointments, etc.  Non-urgent messages can be sent to your provider as well.   To learn more about what you can do with MyChart, go to [email or web address].   Other Instructions Take lasix  as needed for weight gain of 2+ lbs in 24 hours Weigh yourself daily at the same time (morning), after using the bathroom, in the same amount of clothing daily.

## 2024-05-14 NOTE — Progress Notes (Signed)
 Office Visit    Patient Name: Vanessa Vazquez Date of Encounter: 05/14/2024  Primary Care Provider:  Ostwalt, Janna, PA-C Primary Cardiologist:  Redell Cave, MD    Chief Complaint    89 y.o. female w/ a history of HTN, HL, DMII, HFpEF, stage III chronic kidney disease, GERD, sleep disordered breathing, and obesity, who presents for follow-up related to HFpEF.  Past Medical History   Subjective   Past Medical History:  Diagnosis Date   Asthma    Diabetes mellitus without complication (HCC)    GERD (gastroesophageal reflux disease)    Hypercholesteremia    Hypertension    Past Surgical History:  Procedure Laterality Date   CESAREAN SECTION     COLONOSCOPY WITH PROPOFOL  N/A 03/19/2016   Procedure: COLONOSCOPY WITH PROPOFOL ;  Surgeon: Lamar ONEIDA Holmes, MD;  Location: [facility] ENDOSCOPY;  Service: Endoscopy;  Laterality: N/A;    Allergies  Allergies[1]     History of Present Illness      89 y.o. y/o female with a history of hypertension, hyperlipidemia, type 2 diabetes mellitus, chronic HFpEF, stage III chronic kidney disease, GERD, sleep disordered breathing, and obesity.  She established care with Dr. Cave in July 2024 in the setting of fatigue, dyspnea, and lower extremity edema.  Echocardiogram in August 2024 showed an EF of 55 to 60% without regional wall motion abnormalities, moderate LVH, grade 1 diastolic dysfunction, normal RV function, and aortic valve sclerosis without stenosis.   At June 2025 cardiology visit, patient complained of lower extremity swelling, for which she used as needed Lasix .  She had 1-2+ edema in that day and was encouraged to take Lasix  for 4 straight days to be followed by 3 days weekly.  She says that she has continued to take Lasix  as needed only which is usually only a few times a month.  She has not been weighing herself at home so she only takes it when she feels like she is swollen.  In that setting, she continues to have dyspnea  on exertion with minimal activity and fatigue.  She is mostly sedentary.  She does not experience chest pain and denies palpitations, PND, orthopnea, dizziness, syncope, or early satiety. Objective   Home Medications    Current Outpatient Medications  Medication Sig Dispense Refill   albuterol  (VENTOLIN  HFA) 108 (90 Base) MCG/ACT inhaler Inhale 2 puffs into the lungs every 4 (four) hours as needed for wheezing or shortness of breath. 18 g 2   atenolol  (TENORMIN ) 50 MG tablet Take 50 mg by mouth daily.     brompheniramine-pseudoephedrine-DM 30-2-10 MG/5ML syrup Take 2.5 mLs by mouth 4 (four) times daily as needed. 120 mL 0   cholecalciferol (VITAMIN D3) 25 MCG (1000 UNIT) tablet Take 1,000 Units by mouth daily.     cyanocobalamin  (VITAMIN B12) 1000 MCG tablet Take 1,000 mcg by mouth daily.     ferrous sulfate  325 (65 FE) MG tablet Take 325 mg by mouth once a week.     furosemide  (LASIX ) 20 MG tablet TAKE 1 TABLET BY MOUTH ONCE DAILY AS NEEDED 90 tablet 0   gabapentin  (NEURONTIN ) 100 MG capsule TAKE 1 CAPSULE BY MOUTH ONCE DAILY 90 capsule 1   glimepiride  (AMARYL ) 2 MG tablet TAKE 1 TABLET BY MOUTH TWICE DAILY 180 tablet 0   JANUVIA  100 MG tablet TAKE 1 TABLET BY MOUTH ONCE DAILY 90 tablet 3   loratadine  (CLARITIN ) 10 MG tablet Take 1 tablet (10 mg total) by mouth daily. 90 tablet  3   losartan -hydrochlorothiazide  (HYZAAR) 50-12.5 MG tablet TAKE 1 TABLET BY MOUTH ONCE DAILY 90 tablet 2   meloxicam (MOBIC) 7.5 MG tablet Take 7.5 mg by mouth daily.     metFORMIN  (GLUCOPHAGE ) 1000 MG tablet TAKE 1 TABLET BY MOUTH TWICE DAILY 180 tablet 3   Misc. Devices (WALKER) MISC 1 each by Does not apply route daily. 1 each 0   omeprazole  (PRILOSEC) 20 MG capsule TAKE 1 CAPSULE BY MOUTH ONCE DAILY 90 capsule 1   pregabalin  (LYRICA ) 50 MG capsule Take 50 mg by mouth daily.     Respiratory Therapy Supplies (NEBULIZER) DEVI 1 Device by Does not apply route 2 (two) times daily as needed. 1 each 0   rosuvastatin   (CRESTOR ) 10 MG tablet Take 1 tablet (10 mg total) by mouth daily. 90 tablet 3   No current facility-administered medications for this visit.     Physical Exam    VS:  BP 130/60 (BP Location: Left Arm, Patient Position: Sitting, Cuff Size: Large)   Pulse 84   Ht 5' (1.524 m)   Wt 210 lb 5 oz (95.4 kg)   SpO2 95%   BMI 41.07 kg/m  , BMI Body mass index is 41.07 kg/m.          GEN: Obese, in no acute distress. HEENT: normal. Neck: Supple, no JVD, carotid bruits, or masses. Cardiac: RRR, 2/6 systolic murmur at the upper sternal borders, no rubs or gallops. No clubbing, cyanosis, 1+ bilateral lower extremity edema.  Radials 2+/PT 2+ and equal bilaterally.  Respiratory:  Respirations regular and unlabored, clear to auscultation bilaterally. GI: Soft, nontender, nondistended, BS + x 4. MS: no deformity or atrophy. Skin: warm and dry, no rash. Neuro:  Strength and sensation are intact. Psych: Normal affect.  Accessory Clinical Findings    ECG personally reviewed by me today - EKG Interpretation Date/Time:  Thursday May 14 2024 09:59:39 EST Ventricular Rate:  84 PR Interval:  114 QRS Duration:  128 QT Interval:  382 QTC Calculation: 451 R Axis:   -22  Text Interpretation: Sinus rhythm with Premature atrial complexes Left bundle branch block Premature atrial complexes are now Present Confirmed by Vivienne Bruckner [phone]) on 05/14/2024 12:28:43 PM  - no acute changes.  Lab Results  Component Value Date   WBC 5.2 03/23/2024   HGB 11.9 03/23/2024   HCT 36.7 03/23/2024   MCV 96 03/23/2024   PLT 242 03/23/2024   Lab Results  Component Value Date   CREATININE 1.09 (H) 03/23/2024   BUN 21 03/23/2024   NA 142 03/23/2024   K 4.2 03/23/2024   CL 103 03/23/2024   CO2 22 03/23/2024   Lab Results  Component Value Date   ALT 13 03/23/2024   AST 20 03/23/2024   ALKPHOS 84 03/23/2024   BILITOT 0.4 03/23/2024   Lab Results  Component Value Date   CHOL 199 03/23/2024    HDL 74 03/23/2024   LDLCALC 104 (H) 03/23/2024   TRIG 121 03/23/2024   CHOLHDL 2.7 03/23/2024    Lab Results  Component Value Date   HGBA1C 5.6 03/23/2024   Lab Results  Component Value Date   TSH 0.608 03/20/2023       Assessment & Plan    1.  Chronic HFpEF: Patient with a long history of fatigue, dyspnea on exertion, and lower extremity edema.  Echocardiogram in August 2024 showed an EF of 50 has 60% without regional wall motion abnormalities, moderate LVH, grade 1 diastolic dysfunction,  normal RV function, and aortic valve sclerosis without stenosis.  She continues to experience lower extremity edema, dyspnea, and fatigue though when discussing her medications with her today, she is not taking Lasix  as previously advised.  At her last visit, she was encouraged to take Lasix  3 times a week but notes that she has continued just taking it as needed which she usually comes out to 2-3 times per month.  She has not been weighing herself at home.  We had a long discussion about the role of daily weights as an objective measure of when to take Lasix .  She has mild ankle edema on examination today.  I suspect that some degree of volume overload on a persistent basis is driving some of her dyspnea as well as deconditioning.  I encouraged her to take Lasix  40 mg today and tomorrow and then take daily as needed based on weight gain of 2 pounds in 24 hours.  She and her daughter were able to verbalize this instruction back to me.  She does on a scale and indicates that she will have no problem using it.  2.  Primary hypertension: Blood pressure stable at 130/60 today.  She is on Hyzaar and as needed Lasix .  3.  Hyperlipidemia: LDL cholesterol 104 in October.  She is on rosuvastatin  therapy.  Ideally would like to see LDL at least less than 100.  4.  Type 2 diabetes mellitus: A1c 5.6 in October.  She is on Januvia , metformin , and Amaryl .  Also on statin and ARB.  5.  Stage III chronic kidney  disease: Creatinine 1.09 in October.  On ARB.  6.  Disposition: Follow-up in clinic in 3 months or sooner if necessary.  Lonni Meager, NP 05/14/2024, 12:29 PM     [1] No Known Allergies

## 2024-05-17 NOTE — Progress Notes (Unsigned)
 " Established patient visit  Patient: Vanessa Vazquez   DOB: 1934/08/24   89 y.o. Female  MRN: [id number] Visit Date: 05/19/2024  Today's healthcare provider: Jolynn Spencer, PA-C   No chief complaint on file.  Subjective       Discussed the use of AI scribe software for clinical note transcription with the patient, who gave verbal consent to proceed.  History of Present Illness Vanessa Vazquez is an 89 year old female with diabetes who presents for a follow-up visit.  Her fasting blood sugar was 115 mg/dL this morning. She finds it hard to control her blood sugar during the holidays due to cravings. She takes rosuvastatin  at night for cholesterol.  She has thyroid  disease and had a thyroid  biopsy in August but has not yet received the results.  She uses a walker for mobility. She has not received a replacement walker and is using an old one that is unstable. She has difficulty moving around her house with it and would like a walker with a seat for going out.  She has occasional shortness of breath and leg swelling. She wears compression socks for swelling but finds them uncomfortable. She denies chest pain, palpitations, or vision changes.       01/15/2024   11:39 AM 12/23/2023   10:38 AM 10/11/2023    8:38 AM  Depression screen PHQ 2/9  Decreased Interest 0 0 3  Down, Depressed, Hopeless 0 0 0  PHQ - 2 Score 0 0 3  Altered sleeping 0 0 0  Tired, decreased energy 0 1 2  Change in appetite 0 0 0  Feeling bad or failure about yourself  0 0 0  Trouble concentrating 0 0 0  Moving slowly or fidgety/restless 0 0 0  Suicidal thoughts 0 0 0  PHQ-9 Score 0  1  5   Difficult doing work/chores   Not difficult at all     Data saved with a previous flowsheet row definition      12/23/2023   10:38 AM 10/11/2023    8:38 AM 09/09/2023   10:10 AM 07/26/2023   10:16 AM  GAD 7 : Generalized Anxiety Score  Nervous, Anxious, on Edge 0 0 0 0  Control/stop worrying 0 0 0 0  Worry too much -  different things 0 0 0 0  Trouble relaxing 0 0 0 0  Restless 0 0 0 0  Easily annoyed or irritable 0 0 0 0  Afraid - awful might happen 0 0 0 0  Total GAD 7 Score 0 0 0 0  Anxiety Difficulty  Not difficult at all Not difficult at all Not difficult at all    Medications: Show/hide medication list[1]  Review of Systems  All other systems reviewed and are negative.  All negative Except see HPI       Objective    There were no vitals taken for this visit.    Physical Exam Vitals reviewed.  Constitutional:      General: She is not in acute distress.    Appearance: Normal appearance. She is well-developed. She is not diaphoretic.  HENT:     Head: Normocephalic and atraumatic.  Eyes:     General: No scleral icterus.    Conjunctiva/sclera: Conjunctivae normal.  Neck:     Thyroid : No thyromegaly.  Cardiovascular:     Rate and Rhythm: Normal rate and regular rhythm.     Pulses: Normal pulses.     Heart sounds: Normal heart sounds.  No murmur heard. Pulmonary:     Effort: Pulmonary effort is normal. No respiratory distress.     Breath sounds: Normal breath sounds. No wheezing, rhonchi or rales.  Musculoskeletal:     Cervical back: Neck supple.     Right lower leg: No edema.     Left lower leg: No edema.  Lymphadenopathy:     Cervical: No cervical adenopathy.  Skin:    General: Skin is warm and dry.     Findings: No rash.  Neurological:     Mental Status: She is alert and oriented to person, place, and time. Mental status is at baseline.  Psychiatric:        Mood and Affect: Mood normal.        Behavior: Behavior normal.     No results found for any visits on 05/19/24.      Assessment & Plan Type 2 diabetes mellitus Chronic and stable  blood sugar well-controlled at 115 mg/dL. Dietary challenges during Christmas season. - Encouraged hydration and monitoring for swelling due to kidney issues. - Continue current diabetes management plan. Will do labs as a  follow-up Continue low-carb diet and regular exercise as tolerated Will follow-up  Stage 3b chronic kidney disease Stage 3b with associated swelling. - Advised wearing socks to manage swelling. - Continue to monitor kidney function and manage fluid intake.  Advised to avoid taking NSAIDs And will follow-up  Thyroid  nodule Thyroid  biopsy results pending. Endocrinology follow-up in a year. - Will verify receipt of thyroid  biopsy results from [facility]. - Continue follow-up with endocrinology as scheduled.  Mobility impairment Requires walker; current one is old and in disrepair. Issues with insurance for new walker. Prefers walker with seat. - Send picture of desired walker to provider for evaluation. - Ensure insurance approval for the new walker. - Encouraged use of a walker with a seat for safety and comfort. Agreed to reassess these issues with patient and her caregiver on the next follow-up  General Health Maintenance Encouraged to maintain physical activity and perform exercises to improve mobility and strength. - Perform daily exercises as advised by chiropractic care. - Encouraged regular movement and exercise, even while sitting.   Hyperlipidemia associated with type 2 diabetes mellitus (HCC) Chronic and stable Will repeat labs at the follow-up Continue current regimen: Rosuvastatin  10 mg daily Will follow-up  Hypertension associated with diabetes (HCC) Chronic and slightly unstable Continue current blood pressure regimen :hyzaar 50-12.5 mg daily advised to monitor blood pressure at home Advised an adherence to low-sodium diet and regular exercise as tolerated Will follow-up   No orders of the defined types were placed in this encounter.   No follow-ups on file.   The patient was advised to call back or seek an in-person evaluation if the symptoms worsen or if the condition fails to improve as anticipated.  I discussed the assessment and treatment plan with  the patient. The patient was provided an opportunity to ask questions and all were answered. The patient agreed with the plan and demonstrated an understanding of the instructions.  I, Veryl Abril, PA-C have reviewed all documentation for this visit. The documentation on 05/19/2024  for the exam, diagnosis, procedures, and orders are all accurate and complete.  Jolynn Spencer, [facility], MMS [facility] [phone] (phone) [phone] (fax)  [facility]    [1]  Outpatient Medications Prior to Visit  Medication Sig   albuterol  (VENTOLIN  HFA) 108 (90 Base) MCG/ACT inhaler Inhale 2 puffs into the  lungs every 4 (four) hours as needed for wheezing or shortness of breath.   atenolol  (TENORMIN ) 50 MG tablet Take 50 mg by mouth daily.   brompheniramine-pseudoephedrine-DM 30-2-10 MG/5ML syrup Take 2.5 mLs by mouth 4 (four) times daily as needed.   cholecalciferol (VITAMIN D3) 25 MCG (1000 UNIT) tablet Take 1,000 Units by mouth daily.   cyanocobalamin  (VITAMIN B12) 1000 MCG tablet Take 1,000 mcg by mouth daily.   ferrous sulfate  325 (65 FE) MG tablet Take 325 mg by mouth once a week.   furosemide  (LASIX ) 20 MG tablet TAKE 1 TABLET BY MOUTH ONCE DAILY AS NEEDED   gabapentin  (NEURONTIN ) 100 MG capsule TAKE 1 CAPSULE BY MOUTH ONCE DAILY   glimepiride  (AMARYL ) 2 MG tablet TAKE 1 TABLET BY MOUTH TWICE DAILY   JANUVIA  100 MG tablet TAKE 1 TABLET BY MOUTH ONCE DAILY   loratadine  (CLARITIN ) 10 MG tablet Take 1 tablet (10 mg total) by mouth daily.   losartan -hydrochlorothiazide  (HYZAAR) 50-12.5 MG tablet TAKE 1 TABLET BY MOUTH ONCE DAILY   meloxicam (MOBIC) 7.5 MG tablet Take 7.5 mg by mouth daily.   metFORMIN  (GLUCOPHAGE ) 1000 MG tablet TAKE 1 TABLET BY MOUTH TWICE DAILY   Misc. Devices (WALKER) MISC 1 each by Does not apply route daily.   omeprazole  (PRILOSEC) 20 MG capsule TAKE 1 CAPSULE BY MOUTH ONCE DAILY   pregabalin  (LYRICA ) 50 MG capsule Take 50 mg by mouth daily.    Respiratory Therapy Supplies (NEBULIZER) DEVI 1 Device by Does not apply route 2 (two) times daily as needed.   rosuvastatin  (CRESTOR ) 10 MG tablet Take 1 tablet (10 mg total) by mouth daily.   No facility-administered medications prior to visit.   "

## 2024-05-18 ENCOUNTER — Ambulatory Visit: Admitting: Podiatry

## 2024-05-18 DIAGNOSIS — Z91198 Patient's noncompliance with other medical treatment and regimen for other reason: Secondary | ICD-10-CM

## 2024-05-18 NOTE — Progress Notes (Signed)
 1. Failure to attend appointment with reason given    Rescheduled by patient.

## 2024-05-19 ENCOUNTER — Encounter: Payer: Self-pay | Admitting: Physician Assistant

## 2024-05-19 ENCOUNTER — Ambulatory Visit (INDEPENDENT_AMBULATORY_CARE_PROVIDER_SITE_OTHER): Admitting: Physician Assistant

## 2024-05-19 VITALS — BP 139/56 | HR 85 | Resp 16 | Wt 209.3 lb

## 2024-05-19 DIAGNOSIS — Z7984 Long term (current) use of oral hypoglycemic drugs: Secondary | ICD-10-CM

## 2024-05-19 DIAGNOSIS — E041 Nontoxic single thyroid nodule: Secondary | ICD-10-CM | POA: Diagnosis not present

## 2024-05-19 DIAGNOSIS — E1122 Type 2 diabetes mellitus with diabetic chronic kidney disease: Secondary | ICD-10-CM

## 2024-05-19 DIAGNOSIS — Z7409 Other reduced mobility: Secondary | ICD-10-CM | POA: Diagnosis not present

## 2024-05-19 DIAGNOSIS — E118 Type 2 diabetes mellitus with unspecified complications: Secondary | ICD-10-CM

## 2024-05-19 DIAGNOSIS — I152 Hypertension secondary to endocrine disorders: Secondary | ICD-10-CM

## 2024-05-19 DIAGNOSIS — E1169 Type 2 diabetes mellitus with other specified complication: Secondary | ICD-10-CM

## 2024-05-19 DIAGNOSIS — G8929 Other chronic pain: Secondary | ICD-10-CM

## 2024-05-19 DIAGNOSIS — E1159 Type 2 diabetes mellitus with other circulatory complications: Secondary | ICD-10-CM

## 2024-05-19 DIAGNOSIS — N1832 Chronic kidney disease, stage 3b: Secondary | ICD-10-CM | POA: Diagnosis not present

## 2024-05-19 DIAGNOSIS — E785 Hyperlipidemia, unspecified: Secondary | ICD-10-CM

## 2024-05-19 DIAGNOSIS — M7989 Other specified soft tissue disorders: Secondary | ICD-10-CM

## 2024-05-20 ENCOUNTER — Other Ambulatory Visit: Payer: Self-pay | Admitting: Physician Assistant

## 2024-05-20 DIAGNOSIS — E118 Type 2 diabetes mellitus with unspecified complications: Secondary | ICD-10-CM

## 2024-06-02 ENCOUNTER — Telehealth: Payer: Self-pay

## 2024-06-02 NOTE — Telephone Encounter (Signed)
 Copied from CRM [id number]. Topic: Clinical - Medical Advice >> Jun 02, 2024  3:44 PM Winona SAUNDERS wrote: Jimmie a NP with [employer] calling to inform the provider she heard a heart murmur when listening to the pt. Pt states she never knew

## 2024-06-08 ENCOUNTER — Other Ambulatory Visit: Payer: Self-pay

## 2024-06-08 ENCOUNTER — Emergency Department
Admission: EM | Admit: 2024-06-08 | Discharge: 2024-06-08 | Disposition: A | Discharge status: Home / Self Care | Attending: Emergency Medicine | Admitting: Emergency Medicine

## 2024-06-08 ENCOUNTER — Emergency Department

## 2024-06-08 DIAGNOSIS — E119 Type 2 diabetes mellitus without complications: Secondary | ICD-10-CM | POA: Insufficient documentation

## 2024-06-08 DIAGNOSIS — I10 Essential (primary) hypertension: Secondary | ICD-10-CM | POA: Insufficient documentation

## 2024-06-08 DIAGNOSIS — R0789 Other chest pain: Secondary | ICD-10-CM | POA: Diagnosis present

## 2024-06-08 DIAGNOSIS — R079 Chest pain, unspecified: Secondary | ICD-10-CM

## 2024-06-08 LAB — BASIC METABOLIC PANEL WITH GFR
Anion gap: 11 (ref 5–15)
BUN: 25 mg/dL — ABNORMAL HIGH (ref 8–23)
CO2: 25 mmol/L (ref 22–32)
Calcium: 10.8 mg/dL — ABNORMAL HIGH (ref 8.9–10.3)
Chloride: 104 mmol/L (ref 98–111)
Creatinine, Ser: 0.92 mg/dL (ref 0.44–1.00)
GFR, Estimated: >60 mL/min
Glucose, Bld: 130 mg/dL — ABNORMAL HIGH (ref 70–99)
Potassium: 4.1 mmol/L (ref 3.5–5.1)
Sodium: 141 mmol/L (ref 135–145)

## 2024-06-08 LAB — CBC
HCT: 36.2 % (ref 36.0–46.0)
Hemoglobin: 11.6 g/dL — ABNORMAL LOW (ref 12.0–15.0)
MCH: 31.4 pg (ref 26.0–34.0)
MCHC: 32 g/dL (ref 30.0–36.0)
MCV: 97.8 fL (ref 80.0–100.0)
Platelets: 251 K/uL (ref 150–400)
RBC: 3.7 MIL/uL — ABNORMAL LOW (ref 3.87–5.11)
RDW: 12.2 % (ref 11.5–15.5)
WBC: 4.9 K/uL (ref 4.0–10.5)
nRBC: 0 % (ref 0.0–0.2)

## 2024-06-08 LAB — TROPONIN T, HIGH SENSITIVITY: Troponin T High Sensitivity: <15 ng/L (ref 0–19)

## 2024-06-08 NOTE — ED Notes (Signed)
Patient provided juice per request

## 2024-06-08 NOTE — ED Triage Notes (Signed)
 Pt to ED [facility] from home for slip off bed while taking socks off. C/o chest pain started this am prior to fall. Denies hitting head. Denies shob/n/v

## 2024-06-08 NOTE — ED Provider Notes (Signed)
 "  [facility] Provider Note    Event Date/Time   First MD Initiated Contact with Patient 06/08/24 [phone]     (approximate)   History   Fall   HPI  Vanessa Vazquez is a 89 y.o. female with history of diabetes, hypertension who presents with complaints of intermittent chest pain which has been ongoing over the last several days, she reports it is primarily occurring at night when she is lying down.  No history of acid reflux reported.  This morning she was putting her socks on and lost her balance and slid out of bed.  She denies injury.     Physical Exam   Triage Vital Signs: ED Triage Vitals  Encounter Vitals Group     BP 06/08/24 0924 (!) 166/92     Girls Systolic BP Percentile --      Girls Diastolic BP Percentile --      Boys Systolic BP Percentile --      Boys Diastolic BP Percentile --      Pulse Rate 06/08/24 0924 (!) 109     Resp 06/08/24 0924 20     Temp 06/08/24 0924 98.6 F (37 C)     Temp src --      SpO2 06/08/24 0924 95 %     Weight 06/08/24 0924 95 kg (209 lb 7 oz)     Height 06/08/24 0924 1.549 m (5' 1)     Head Circumference --      Peak Flow --      Pain Score 06/08/24 0923 0     Pain Loc --      Pain Education --      Exclude from Growth Chart --     Most recent vital signs: Vitals:   06/08/24 0924  BP: (!) 166/92  Pulse: (!) 109  Resp: 20  Temp: 98.6 F (37 C)  SpO2: 95%     General: Awake, no distress.  CV:  Good peripheral perfusion.  Resp:  Normal effort.  Clear to auscultation bilaterally regular rate and rhythm Abd:  No distention.  Other:  No calf pain or swelling   ED Results / Procedures / Treatments   Labs (all labs ordered are listed, but only abnormal results are displayed) Labs Reviewed  BASIC METABOLIC PANEL WITH GFR - Abnormal; Notable for the following components:      Result Value   Glucose, Bld 130 (*)    BUN 25 (*)    Calcium  10.8 (*)    All other components within normal limits  CBC -  Abnormal; Notable for the following components:   RBC 3.70 (*)    Hemoglobin 11.6 (*)    All other components within normal limits  TROPONIN T, HIGH SENSITIVITY     EKG  ED ECG REPORT I, Lamar Price, the attending physician, personally viewed and interpreted this ECG.  Date: 06/08/2024  Rhythm: normal sinus rhythm QRS Axis: normal Intervals: normal ST/T Wave abnormalities: Nonspecific changes Narrative Interpretation: no evidence of acute ischemia    RADIOLOGY Chest x-ray viewed and interpreted by me, no acute abnormality, confirmed by radiology    PROCEDURES:  Critical Care performed:   Procedures   MEDICATIONS ORDERED IN ED: Medications - No data to display   IMPRESSION / MDM / ASSESSMENT AND PLAN / ED COURSE  I reviewed the triage vital signs and the nursing notes. Patient's presentation is most consistent with acute presentation with potential threat to life or bodily  function.  Patient presents with complaints of intermittent chest discomfort as detailed above in the setting of multiple comorbidities.  Differential includes angina, ACS, GERD/esophagitis  Will obtain labs including high sensitive troponin  EKG without evidence of acute ischemia  Chest x-ray, lab work is reassuring, she has mildly chronically elevated calcium , discussed this with her and the need for outpatient follow-up.  No indication for admission at this time, she is asymptomatic feels well  Appropriate for discharge at this time, return precautions discussed, she and her family agree with this plan      FINAL CLINICAL IMPRESSION(S) / ED DIAGNOSES   Final diagnoses:  Chest pain, unspecified type     Rx / DC Orders   ED Discharge Orders          Ordered    Ambulatory referral to Cardiology       Comments: If you have not heard from the Cardiology office within the next 72 hours please call [phone].   06/08/24 1153             Note:  This document was prepared  using Dragon voice recognition software and may include unintentional dictation errors.   Arlander Charleston, MD 06/08/24 1207  "

## 2024-06-18 ENCOUNTER — Encounter: Payer: Self-pay | Admitting: Nurse Practitioner

## 2024-06-18 ENCOUNTER — Ambulatory Visit: Attending: Nurse Practitioner | Admitting: Nurse Practitioner

## 2024-06-18 VITALS — BP 120/70 | HR 91 | Ht 61.0 in | Wt 207.2 lb

## 2024-06-18 DIAGNOSIS — R072 Precordial pain: Secondary | ICD-10-CM | POA: Diagnosis not present

## 2024-06-18 DIAGNOSIS — I5032 Chronic diastolic (congestive) heart failure: Secondary | ICD-10-CM

## 2024-06-18 DIAGNOSIS — E785 Hyperlipidemia, unspecified: Secondary | ICD-10-CM

## 2024-06-18 DIAGNOSIS — I10 Essential (primary) hypertension: Secondary | ICD-10-CM

## 2024-06-18 DIAGNOSIS — E119 Type 2 diabetes mellitus without complications: Secondary | ICD-10-CM | POA: Diagnosis not present

## 2024-06-18 DIAGNOSIS — N1831 Chronic kidney disease, stage 3a: Secondary | ICD-10-CM

## 2024-06-18 MED ORDER — FUROSEMIDE 20 MG PO TABS: 20.0000 mg | ORAL_TABLET | ORAL | 0 refills | Status: AC

## 2024-06-18 NOTE — Progress Notes (Signed)
 "    Office Visit    Patient Name: Vanessa Vazquez Date of Encounter: 06/18/2024  Primary Care Provider:  Ostwalt, Janna, PA-C Primary Cardiologist:  Redell Cave, MD  Cardiology APP:  Vivienne Lonni Ingle, NP   Chief Complaint    89 y.o. female w/ a history of HTN, HL, DMII, HFpEF, stage III chronic kidney disease, GERD, sleep disordered breathing, and obesity, who presents for follow-up related to chest pain.  Past Medical History   Subjective   Past Medical History:  Diagnosis Date   (HFpEF) heart failure with preserved ejection fraction (HCC)    a. 12/2022 Echo: EF of 55 to 60% without regional wall motion abnormalities, moderate LVH, grade 1 diastolic dysfunction, normal RV function, and aortic valve sclerosis without stenosis.   Asthma    Diabetes mellitus without complication (HCC)    GERD (gastroesophageal reflux disease)    Hypercholesteremia    Hypertension    Past Surgical History:  Procedure Laterality Date   CESAREAN SECTION     COLONOSCOPY WITH PROPOFOL  N/A 03/19/2016   Procedure: COLONOSCOPY WITH PROPOFOL ;  Surgeon: Lamar ONEIDA Holmes, MD;  Location: [facility] ENDOSCOPY;  Service: Endoscopy;  Laterality: N/A;    Allergies  Allergies[1]     History of Present Illness      89 y.o. y/o female with a history of hypertension, hyperlipidemia, type 2 diabetes mellitus, chronic HFpEF, stage III chronic kidney disease, GERD, sleep disordered breathing, and obesity.  She established care with Dr. Cave in July 2024 in the setting of fatigue, dyspnea, and lower extremity edema.  Echocardiogram in August 2024 showed an EF of 55 to 60% without regional wall motion abnormalities, moderate LVH, grade 1 diastolic dysfunction, normal RV function, and aortic valve sclerosis without stenosis.    At cardiology visit in December 2025, she noted ongoing lower extremity edema and dyspnea on exertion.  She was only taking Lasix  2-3 times per month despite being advised to take  it 3 times a week at prior visit.  She was encouraged to take Lasix  40 mg for 2 consecutive days and then daily as needed for weight gains of 2 pounds in 24 hours.  Vanessa Vazquez was seen in the emergency department on June 08, 2024 in the setting of a several day history of intermittent chest pain, primarily occurring at night when she is lying down.  The morning of ED visit, she was putting on her socks and she lost her balance and slid out of bed without any significant trauma.  In the emergency department, ECG was unremarkable and troponin was normal.  She was discharged home.  Over the past month, she has not changed her frequency of Lasix  dosing.  She continues to use it only a few times a month though, she did take 2 doses this week in the setting of lower extremity swelling.  She is not routinely weighing herself.  She continues to experience lower extremity edema and dyspnea on exertion.  She denies palpitations, PND, orthopnea, dizziness, syncope, or early satiety.  She has not had any recurrence of chest pain since her ED visit and denies experiencing any exertional chest pain. Objective   Home Medications    Current Outpatient Medications  Medication Sig Dispense Refill   albuterol  (VENTOLIN  HFA) 108 (90 Base) MCG/ACT inhaler Inhale 2 puffs into the lungs every 4 (four) hours as needed for wheezing or shortness of breath. 18 g 2   atenolol  (TENORMIN ) 50 MG tablet Take 50 mg by mouth  daily.     brompheniramine-pseudoephedrine-DM 30-2-10 MG/5ML syrup Take 2.5 mLs by mouth 4 (four) times daily as needed. 120 mL 0   cholecalciferol (VITAMIN D3) 25 MCG (1000 UNIT) tablet Take 1,000 Units by mouth daily.     cyanocobalamin  (VITAMIN B12) 1000 MCG tablet Take 1,000 mcg by mouth daily.     ferrous sulfate  325 (65 FE) MG tablet Take 325 mg by mouth once a week.     furosemide  (LASIX ) 20 MG tablet TAKE 1 TABLET BY MOUTH ONCE DAILY AS NEEDED 90 tablet 0   gabapentin  (NEURONTIN ) 100 MG capsule TAKE 1  CAPSULE BY MOUTH ONCE DAILY 90 capsule 1   glimepiride  (AMARYL ) 2 MG tablet TAKE 1 TABLET BY MOUTH TWICE DAILY 180 tablet 0   JANUVIA  100 MG tablet TAKE 1 TABLET BY MOUTH ONCE DAILY 90 tablet 3   loratadine  (CLARITIN ) 10 MG tablet Take 1 tablet (10 mg total) by mouth daily. 90 tablet 3   losartan -hydrochlorothiazide  (HYZAAR) 50-12.5 MG tablet TAKE 1 TABLET BY MOUTH ONCE DAILY 90 tablet 2   meloxicam (MOBIC) 7.5 MG tablet Take 7.5 mg by mouth daily.     metFORMIN  (GLUCOPHAGE ) 1000 MG tablet TAKE 1 TABLET BY MOUTH TWICE DAILY 180 tablet 3   Misc. Devices (WALKER) MISC 1 each by Does not apply route daily. 1 each 0   omeprazole  (PRILOSEC) 20 MG capsule TAKE 1 CAPSULE BY MOUTH ONCE DAILY 90 capsule 1   pregabalin  (LYRICA ) 50 MG capsule Take 50 mg by mouth daily.     Respiratory Therapy Supplies (NEBULIZER) DEVI 1 Device by Does not apply route 2 (two) times daily as needed. 1 each 0   rosuvastatin  (CRESTOR ) 10 MG tablet Take 1 tablet (10 mg total) by mouth daily. 90 tablet 3   No current facility-administered medications for this visit.     Physical Exam    VS:  BP 120/70 (BP Location: Left Arm, Patient Position: Sitting, Cuff Size: Large)   Pulse 91   Ht 5' 1 (1.549 m)   Wt 207 lb 4 oz (94 kg)   SpO2 98%   BMI 39.16 kg/m  , BMI Body mass index is 39.16 kg/m.          GEN: Well nourished, well developed, in no acute distress. HEENT: normal. Neck: Supple, no JVD, carotid bruits, or masses. Cardiac: RRR, 1/6 systolic murmur at the upper sternal borders.  No rubs or gallops. No clubbing, cyanosis, 1+ bilateral lower extremity edema.  Radials 2+/PT 2+ and equal bilaterally.  Respiratory:  Respirations regular and unlabored, clear to auscultation bilaterally. GI: Soft, nontender, nondistended, BS + x 4. MS: no deformity or atrophy. Skin: warm and dry, no rash. Neuro:  Strength and sensation are intact. Psych: Normal affect.  Accessory Clinical Findings    ECG personally reviewed by  me today - EKG Interpretation Date/Time:  Thursday June 18 2024 11:03:47 EST Ventricular Rate:  91 PR Interval:  138 QRS Duration:  126 QT Interval:  380 QTC Calculation: 467 R Axis:   -36  Text Interpretation: Normal sinus rhythm Left axis deviation Left bundle branch block Confirmed by Vivienne Bruckner [phone]) on 06/18/2024 11:07:34 AM   - no acute changes.  Lab Results  Component Value Date   WBC 4.9 06/08/2024   HGB 11.6 (L) 06/08/2024   HCT 36.2 06/08/2024   MCV 97.8 06/08/2024   PLT 251 06/08/2024   Lab Results  Component Value Date   CREATININE 0.92 06/08/2024   BUN 25 (  H) 06/08/2024   NA 141 06/08/2024   K 4.1 06/08/2024   CL 104 06/08/2024   CO2 25 06/08/2024   Lab Results  Component Value Date   ALT 13 03/23/2024   AST 20 03/23/2024   ALKPHOS 84 03/23/2024   BILITOT 0.4 03/23/2024   Lab Results  Component Value Date   CHOL 199 03/23/2024   HDL 74 03/23/2024   LDLCALC 104 (H) 03/23/2024   TRIG 121 03/23/2024   CHOLHDL 2.7 03/23/2024    Lab Results  Component Value Date   HGBA1C 5.6 03/23/2024   Lab Results  Component Value Date   TSH 0.608 03/20/2023       Assessment & Plan    1.  Chronic HFpEF: Patient with a long history of fatigue, dyspnea exertion, lower extremity edema.  Echo in August 2024 showed an EF of 55 to 60% without regional wall motion abnormalities, moderate LVH, grade 1 diastolic dysfunction, normal RV function, and aortic valve sclerosis without stenosis.  At her visit 1 month ago, she reported ongoing dyspnea and swelling and we discussed taking Lasix  more frequently based on an objective measure like weight gain.  Over the past month, she is continue to take Lasix  on an as needed basis based on subjective evidence of lower extremity swelling, and has also continued to only take her Lasix  about 1-2 times a month, though she did take it twice this week.  On exam today, she has 1+ ankle swelling.  She continues to report dyspnea on  exertion.  I suspect she needs to take Lasix  at least twice a week and encouraged her to take 20 mg on Mondays and Thursdays.  Blood pressure is controlled on beta-blocker and losartan -HCTZ.  2.  Precordial pain/indigestion: Patient was seen in the emergency department 10 days ago due to intermittent chest pain that was occurring while lying down at night, which improves with sitting up.  She has chronic dyspnea exertion and denies any exertional chest pain.  Troponin was normal and ECG without acute changes.  She has not had any recurrent chest pain.  Suspect GERD for which she is taking Prilosec.  3.  Primary hypertension: Stable on beta-blocker and losartan -HCTZ.  4.  Hyperlipidemia: LDL cholesterol 104 in October.  She is on rosuvastatin  therapy.  5.  Type 2 diabetes mellitus: A1c 5.6 in October.  She is on Januvia , metformin , and Amaryl  along with statin and ARB.  6.  Stage III chronic kidney disease: Creatinine of 0.92 last week.  Continue ARB.  7.  Disposition: Follow-up in clinic in 3 months or sooner if necessary.  Lonni Meager, NP 06/18/2024, 11:08 AM     [1] No Known Allergies  "

## 2024-06-18 NOTE — Patient Instructions (Signed)
 Medication Instructions:  FUROSEMIDE : Take one 20 mg tablet on Monday and Thursday.  *If you need a refill on your cardiac medications before your next appointment, please call your pharmacy*  Lab Work: None ordered If you have labs (blood work) drawn today and your tests are completely normal, you will receive your results only by: MyChart Message (if you have MyChart) OR A paper copy in the mail If you have any lab test that is abnormal or we need to change your treatment, we will call you to review the results.  Testing/Procedures: None ordered  Follow-Up: At [facility], you and your health needs are our priority.  As part of our continuing mission to provide you with exceptional heart care, our providers are all part of one team.  This team includes your primary Cardiologist (physician) and Advanced Practice Providers or APPs (Physician Assistants and Nurse Practitioners) who all work together to provide you with the care you need, when you need it.  Your next appointment:   3 month(s)  Provider:   Redell Cave, MD or Lonni Meager, NP    We recommend signing up for the patient portal called MyChart.  Sign up information is provided on this After Visit Summary.  MyChart is used to connect with patients for Virtual Visits (Telemedicine).  Patients are able to view lab/test results, encounter notes, upcoming appointments, etc.  Non-urgent messages can be sent to your provider as well.   To learn more about what you can do with MyChart, go to [email or web address].

## 2024-07-31 ENCOUNTER — Ambulatory Visit: Admitting: Podiatry

## 2024-08-17 ENCOUNTER — Ambulatory Visit: Admitting: Physician Assistant

## 2024-08-28 ENCOUNTER — Ambulatory Visit: Admitting: Nurse Practitioner

## 2024-09-16 ENCOUNTER — Ambulatory Visit: Admitting: Cardiology

## 2025-01-20 ENCOUNTER — Ambulatory Visit
